# Patient Record
Sex: Female | Born: 1995
Health system: Southern US, Community
[De-identification: ages and names within clinical notes are randomized; demographics above are authoritative.]

## PROBLEM LIST (undated history)

## (undated) DIAGNOSIS — R51 Headache: Secondary | ICD-10-CM

## (undated) DIAGNOSIS — J302 Other seasonal allergic rhinitis: Secondary | ICD-10-CM

## (undated) DIAGNOSIS — K219 Gastro-esophageal reflux disease without esophagitis: Secondary | ICD-10-CM

## (undated) DIAGNOSIS — T7840XA Allergy, unspecified, initial encounter: Secondary | ICD-10-CM

## (undated) DIAGNOSIS — J45909 Unspecified asthma, uncomplicated: Secondary | ICD-10-CM

## (undated) DIAGNOSIS — F419 Anxiety disorder, unspecified: Secondary | ICD-10-CM

## (undated) DIAGNOSIS — L719 Rosacea, unspecified: Secondary | ICD-10-CM

## (undated) DIAGNOSIS — M329 Systemic lupus erythematosus, unspecified: Secondary | ICD-10-CM

## (undated) DIAGNOSIS — F32A Depression, unspecified: Secondary | ICD-10-CM

## (undated) DIAGNOSIS — IMO0002 Reserved for concepts with insufficient information to code with codable children: Secondary | ICD-10-CM

## (undated) HISTORY — DX: Systemic lupus erythematosus, unspecified: M32.9

## (undated) HISTORY — DX: Anxiety disorder, unspecified: F41.9

## (undated) HISTORY — PX: WISDOM TOOTH EXTRACTION: SHX21

## (undated) HISTORY — DX: Gastro-esophageal reflux disease without esophagitis: K21.9

## (undated) HISTORY — DX: Rosacea, unspecified: L71.9

## (undated) HISTORY — PX: NO PAST SURGERIES: SHX2092

## (undated) HISTORY — DX: Reserved for concepts with insufficient information to code with codable children: IMO0002

## (undated) HISTORY — DX: Other seasonal allergic rhinitis: J30.2

## (undated) HISTORY — DX: Allergy, unspecified, initial encounter: T78.40XA

## (undated) HISTORY — DX: Unspecified asthma, uncomplicated: J45.909

## (undated) HISTORY — DX: Depression, unspecified: F32.A

## (undated) HISTORY — DX: Headache: R51

---
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
## 2014-01-24 ENCOUNTER — Ambulatory Visit (INDEPENDENT_AMBULATORY_CARE_PROVIDER_SITE_OTHER): Payer: 59 | Admitting: Psychiatry

## 2014-01-24 ENCOUNTER — Encounter (INDEPENDENT_AMBULATORY_CARE_PROVIDER_SITE_OTHER): Payer: Self-pay

## 2014-01-24 VITALS — BP 110/73 | HR 68 | Ht 61.0 in | Wt 157.0 lb

## 2014-01-24 DIAGNOSIS — F411 Generalized anxiety disorder: Secondary | ICD-10-CM

## 2014-01-24 MED ORDER — SERTRALINE HCL 100 MG PO TABS: 100.0000 mg | ORAL_TABLET | Freq: Every day | ORAL | Status: DC

## 2014-01-24 MED ORDER — PROPRANOLOL HCL 10 MG PO TABS: 15.0000 mg | ORAL_TABLET | Freq: Every day | ORAL | Status: DC

## 2014-01-24 NOTE — Progress Notes (Signed)
Patient ID: Jamie Lopez, female   DOB: May 06, 1996, 18 y.o.   MRN: [id number]  [facility] [address] Progress Note  Jamie Lopez [id number] 18 y.o.  01/24/2014 4:27 PM  Chief Complaint:  HPI Comments: Jamie Lopez is  a 18 y/o female with a past psychiatric history significant for Generalized Anxiety Disorder. The patient is referred to psychiatric services for  medication management.    . Location- The patient reports she has been doing okay.  . Quality: The patient reports that she will be going to college at [employer]. She has had panic attacks 2 years ago. Since she's been on these medications she's not having panic attacks she got a cardiology workup from a cardiologist and did not have any cardiac issues. She has noticed some fatigue or tiredness. Otherwise is no reported side effects In the area of affective symptoms, patient appears euthymic. Patient denies current suicidal ideation, intent, or plan. Patient denies current homicidal ideation, intent, or plan. Patient denies auditory hallucinations. Patient denies visual hallucinations. Patient denies symptoms of paranoia. Patient states sleep is good, with approximately 6-8hours of sleep per night. Appetite is good. Energy level is good. Patient denies symptoms of anhedonia. Patient denies hopelessness, helplessness, or guilt.    . Severity: Depression: 8/10 (0=Very depressed; 5=Neutral; 10=Very Happy)  Anxiety- 5/10 (0=no anxiety; 5= moderate/tolerable anxiety; 10= panic attacks)  . Duration-Since the age of 19.  . Timing-Usually when driving, particularly at night.  . Context-Obsessing about school work.   . Modifying factors-medications have improved her mood.    History of Present Illness: Suicidal Ideation: Negative Plan Formed: Negative Patient has means to carry out plan: Negative  Homicidal Ideation: Negative Plan Formed: Negative Patient has means to carry out plan: Negative   Neurologic: Headache:  Negative Seizure: Negative Paresthesias: Negative  Past Medical Family, Social History: Past Medical History  Diagnosis Date  . Asthma   . Headache(784.0)     Family History  Problem Relation Age of Onset  . Anxiety disorder Mother   . Diabetes Mellitus II Mother   . Anxiety disorder Maternal Grandfather   . Depression Maternal Grandfather   . Alcohol abuse Maternal Grandfather   . Drug abuse Maternal Grandfather   . Anxiety disorder Maternal Grandmother   . Diabetes Mellitus II Maternal Grandmother   . Alcohol abuse Paternal Grandfather   . Drug abuse Paternal Grandfather   . Bipolar disorder Cousin   . Schizophrenia Cousin     History   Social History  . Marital Status: Single    Spouse Name: N/A    Number of Children: N/A  . Years of Education: N/A   Social History Main Topics  . Smoking status: Current Some Day Smoker  . Smokeless tobacco: Never Used     Comment: Once month.  . Alcohol Use: No  . Drug Use: No  . Sexual Activity: Yes    Partners: Male    Birth Control/ Protection: Condom   Other Topics Concern  . Not on file   Social History Narrative  . No narrative on file     Outpatient Encounter Prescriptions as of 01/24/2014  Medication Sig  . PROAIR HFA 108 (90 BASE) MCG/ACT inhaler   . propranolol (INDERAL) 10 MG tablet Take 1.5 tablets (15 mg total) by mouth at bedtime.  . sertraline (ZOLOFT) 100 MG tablet Take 1 tablet (100 mg total) by mouth daily.  . [DISCONTINUED] propranolol (INDERAL) 10 MG tablet Take 2 tablets (20 mg total) by mouth at  bedtime.  . [DISCONTINUED] sertraline (ZOLOFT) 100 MG tablet Take 1 tablet (100 mg total) by mouth daily.    Physical Exam: Review of Systems  Constitutional: Negative for fever, chills, weight loss and malaise/fatigue.  Cardiovascular: Negative for chest pain and palpitations.  Gastrointestinal: Negative for nausea, vomiting and abdominal pain.  Neurological: Negative for dizziness, tingling, tremors,  loss of consciousness and headaches.  Psychiatric/Behavioral: Negative for depression, suicidal ideas, hallucinations, memory loss and substance abuse. The patient is not nervous/anxious and does not have insomnia.     Constitutional:  BP 110/73  Pulse 68  Ht  (1.549 m)  Wt 157 lb (71.215 kg)  BMI 29.68 kg/m2  General Appearance: alert, oriented, no acute distress and well nourished  Musculoskeletal: Gait & Station: normal Patient leans: N/A  Psychiatric Specialty Exam: General Appearance: Casual and Well Groomed  Patent attorney::  Fair  Speech:  Clear and Coherent and Normal Rate  Volume:  Normal  Mood:  "happy"  Affect:  Appropriate, Congruent, Labile and Full Range  Thought Process:  Coherent, Goal Directed, Linear and Logical  Orientation:  Full (Time, Place, and Person)  Thought Content:  WDL  Suicidal Thoughts:  No  Homicidal Thoughts:  No  Memory:  Immediate;   Good Recent;   Good Remote;   Good  Judgement:  Good  Insight:  Good  Psychomotor Activity:  Normal  Concentration:  Good  Recall:  Good  Akathisia:  No  Handed:  Ambidextrous  AIMS (if indicated): Not indicated  Assets:  Communication Skills Desire for Improvement Financial Resources/Insurance Housing Intimacy Leisure Time Physical Health Resilience Social Support Energy manager   Language intact and comprehensive    Fund of knowledge is good     Assessment: Axis I: Generalized Anxiety Disorder-stable. Possible Panic disorder. Stable.    Plan:   Plan of Care:  PLAN:  1. Affirm with the patient that the medications are taken as ordered. Patient  expressed understanding of how their medications were to be used.    Laboratory:  NO labs warranted at this time.  Psychotherapy: Therapy: brief supportive therapy provided.  Discussed psychosocial stressors in detail.More than 50% of the visit was spent on individual therapy/counseling.   Medications:   Continue the following psychiatric medications as written prior to this appointment with the following changes::  a) sertraline 100 mg daily b) Considering her tiredness and propensity of depression related to b blockers, decrease propanolol to 15 mg at night for the next 2 weeks. After 2 weeks she is currently taking 10 mg total she follows up in 4 weeks -Risks and benefits, side effects and alternatives discussed with patient, he/she was given an opportunity to ask questions about his/her medication, illness, and treatment. All current psychiatric medications have been reviewed and discussed with the patient and adjusted as clinically appropriate. The patient has been provided an accurate and updated list of the medications being now prescribed.   Routine PRN Medications:  Negative  Consultations: The patient was encouraged to keep all PCP and specialty clinic appointments.   Safety Concerns:   Patient told to call clinic if any problems occur. Patient advised to go to  ER  if s/he should develop SI/HI, side effects, or if symptoms worsen. Has crisis numbers to call if needed.    Other:   8. Patient was instructed to return to clinic in 1 months.  9. The patient was advised to call and cancel their mental health appointment within 24 hours of  the appointment, if they are unable to keep the appointment, as well as the three no show and termination from clinic policy. 10. The patient expressed understanding of the plan and agrees with the above. 11 Advised patient I would no longer be at this practice after September 11, 2013.  Time Spent: 25 minutes  Thresa Ross, MD 01/24/2014

## 2014-01-24 NOTE — Patient Instructions (Signed)
Lower inderal to  at night for 2 weeks then  at night.

## 2014-02-18 ENCOUNTER — Ambulatory Visit (HOSPITAL_COMMUNITY): Payer: 59 | Admitting: Psychiatry

## 2014-02-27 ENCOUNTER — Ambulatory Visit (HOSPITAL_COMMUNITY): Payer: 59 | Admitting: Psychiatry

## 2015-05-12 ENCOUNTER — Encounter (HOSPITAL_COMMUNITY): Payer: Self-pay | Admitting: Psychiatry

## 2015-05-12 ENCOUNTER — Ambulatory Visit (INDEPENDENT_AMBULATORY_CARE_PROVIDER_SITE_OTHER): Payer: BLUE CROSS/BLUE SHIELD | Admitting: Psychiatry

## 2015-05-12 ENCOUNTER — Encounter (INDEPENDENT_AMBULATORY_CARE_PROVIDER_SITE_OTHER): Payer: Self-pay

## 2015-05-12 VITALS — BP 127/86 | HR 86 | Ht 62.0 in | Wt 164.0 lb

## 2015-05-12 DIAGNOSIS — F411 Generalized anxiety disorder: Secondary | ICD-10-CM | POA: Diagnosis not present

## 2015-05-12 DIAGNOSIS — Z79899 Other long term (current) drug therapy: Secondary | ICD-10-CM

## 2015-05-12 MED ORDER — SERTRALINE HCL 50 MG PO TABS: 50.0000 mg | ORAL_TABLET | Freq: Every day | ORAL | Status: DC

## 2015-05-12 MED ORDER — PROPRANOLOL HCL 10 MG PO TABS: 10.0000 mg | ORAL_TABLET | Freq: Two times a day (BID) | ORAL | Status: DC | PRN

## 2015-05-12 NOTE — Progress Notes (Signed)
Psychiatric Initial Adult Assessment   Patient Identification: Jamie Lopez MRN:  [id number] Date of Evaluation:  05/12/2015 Referral Source: self Chief Complaint:   Chief Complaint    Anxiety     Visit Diagnosis:    ICD-9-CM ICD-10-CM   1. GAD (generalized anxiety disorder) 300.02 F41.1 sertraline (ZOLOFT) 50 MG tablet     propranolol (INDERAL) 10 MG tablet  2. Encounter for long-term (current) use of medications V58.69 Z79.899 CBC     TSH     Comprehensive metabolic panel     Drug Screen, Urine     Vitamin B12   Diagnosis:   Patient Active Problem List   Diagnosis Date Noted  . GAD (generalized anxiety disorder) [F41.1] 10/25/2012  . Migraines [G43.909] 10/25/2012   History of Present Illness:  Pt has been feeling very detached and irritable. Pt has low stress tolerance and is angry all the time. Pt feels stressed all the time. It has been going ton for 2-3 months. Pt is losing interest in school and is thinking of dropping out. Once or twice a month she gets very depressed and wants to cry and stay in bed all day.   Overall denies depression. Reports anhedonia. Denies isolation, crying spells, worthlessness and hopelessness. Denies SI/HI.   Pt is sleeping 5-7 hrs/night. She has racing thoughts that she can't shut off. Pt is tired. Appetite is  Ok but she can't always eat because she is busy. Focus is off.   Elements:  Severity:  moderate. Timing:  1 yr. Duration:  2+ years. Context:  quality of life. Associated Signs/Symptoms: Depression Symptoms:  anhedonia, insomnia, fatigue, difficulty concentrating, anxiety, (Hypo) Manic Symptoms:  Irritable Mood,  Denies manic and hypomanic symptoms including periods of decreased need for sleep, increased energy, mood lability, impulsivity, FOI, and excessive spending.  Anxiety Symptoms:  Excessive Worry, Panic Symptoms, Specific Phobias,  Pt has stress induced panic attacks 1-2 month. It was better with meds.  Pt has  anxiety- racing thoughts, insomnia, inability to relax, headaches and fatigue. It was better with Zoloft and Propanolol.   Pt reports has a fear of death and will make rash decisions to avoid to satisfy her fear.  Denies OCD and social anxiety.   Psychotic Symptoms:  negative PTSD Symptoms: Negative  Past Medical History:  Past Medical History  Diagnosis Date  . Asthma   . Headache(784.0)   . Anxiety   . Seasonal allergies     Past Surgical History  Procedure Laterality Date  . No past surgeries     Past Psych Hx: Dx: GAD, panic attacks began at the age of 70 Meds: Amitriptyline- SE, Zoloft, Propanolol Previous psychiatrist/therapist: Dr. Gilmore Laroche, previous therapist Faylene Kurtz. Hospitalizations: denies SIB: denies Suicide attempts: denies Hx of violent behavior towards others: denies Current access to guns: locked guns in her house Hx of abuse: denies Military Hx: denies   Family History:  Family History  Problem Relation Age of Onset  . Anxiety disorder Mother   . Diabetes Mellitus II Mother   . Depression Mother   . Anxiety disorder Maternal Grandfather   . Depression Maternal Grandfather   . Alcohol abuse Maternal Grandfather   . Drug abuse Maternal Grandfather   . Anxiety disorder Maternal Grandmother   . Diabetes Mellitus II Maternal Grandmother   . Depression Maternal Grandmother   . Alcohol abuse Paternal Grandfather   . Drug abuse Paternal Grandfather   . Bipolar disorder Cousin   . Schizophrenia Cousin   . Drug  abuse Father    Social History:   Social History   Social History  . Marital Status: Single    Spouse Name: N/A  . Number of Children: 0  . Years of Education: 14   Occupational History  . student    Social History Main Topics  . Smoking status: Former Smoker    Quit date: 05/11/2013  . Smokeless tobacco: Never Used     Comment: Once month.  . Alcohol Use: Yes     Comment: 1 drink a few times a year  . Drug Use: No  . Sexual  Activity:    Partners: Male    Birth Control/ Protection: Condom   Other Topics Concern  . None   Social History Narrative   Pt lives in [place] with mom and brother. Pt goes to [employer] and pt is a sophomore. Pt was born in [place] and raised by mom. Pt has 4 siblings and 1 on the way on her dad's side. Childhood was nice and she mostly raised as an child.  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      Additional Social History: n/a  Musculoskeletal: Strength & Muscle Tone: within normal limits Gait & Station: normal  Patient leans: N/A  Psychiatric Specialty Exam: HPI  Review of Systems  Constitutional: Negative for fever and chills.  HENT: Negative for congestion, ear pain and sore throat.   Eyes: Positive for blurred vision. Negative for double vision and discharge.  Respiratory: Negative for cough, shortness of breath and wheezing.   Cardiovascular: Positive for palpitations. Negative for chest pain and leg swelling.  Gastrointestinal: Positive for heartburn. Negative for nausea, vomiting and abdominal pain.  Musculoskeletal: Positive for joint pain. Negative for back pain and neck pain.       R hand itching and swollen. States it is due to a bug bite.   Skin: Positive for itching. Negative for rash.  Neurological: Positive for headaches. Negative for dizziness, tremors, seizures, loss of consciousness and weakness.  Psychiatric/Behavioral: Positive for depression. Negative for suicidal ideas, hallucinations and substance abuse. The patient is nervous/anxious and has insomnia.     Blood pressure 127/86, pulse 86, height  (1.575 m), weight 164 lb (74.39 kg).Body mass index is 29.99 kg/(m^2).  General Appearance: Fairly Groomed  Eye Contact:  Good  Speech:  Clear and Coherent and Normal Rate  Volume:  Normal  Mood:  Anxious and Irritable  Affect:  Congruent  Thought Process:  Goal Directed  Orientation:  Full (Time, Place, and Person)  Thought  Content:  Negative  Suicidal Thoughts:  No  Homicidal Thoughts:  No  Memory:  Immediate;   Good Recent;   Good Remote;   Good  Judgement:  Good  Insight:  Good  Psychomotor Activity:  Normal  Concentration:  Good  Recall:  Good  Fund of Knowledge:Good  Language: Good  Akathisia:  No  Handed:  Right  AIMS (if indicated):  n/a  Assets:  Communication Skills Desire for Improvement Financial Resources/Insurance Housing Leisure Time Physical Health Resilience Social Support Talents/Skills Transportation Vocational/Educational  ADL's:  Intact  Cognition: WNL  Sleep:  fair   Is the patient at risk to self?  No. Has the patient been a risk to self in the past 6 months?  No. Has the patient been a risk to self within the distant past?  No. Is the patient a risk to others?  No. Has the patient been a risk  to others in the past 6 months?  No. Has the patient been a risk to others within the distant past?  No.  Allergies:   Allergies  Allergen Reactions  . Fruit & Vegetable Daily [Nutritional Supplements]   . Peanut-Containing Drug Products     Tree nut specifically    Current Medications: Current Outpatient Prescriptions  Medication Sig Dispense Refill  . levonorgestrel-ethinyl estradiol (SEASONALE,INTROVALE,JOLESSA) 0.15-0.03 MG tablet Take 1 tablet by mouth daily.    Marland Kitchen. PROAIR HFA 108 (90 BASE) MCG/ACT inhaler     . propranolol (INDERAL) 10 MG tablet Take 1.5 tablets (15 mg total) by mouth at bedtime. (Patient not taking: Reported on 05/12/2015) 45 tablet 1  . sertraline (ZOLOFT) 100 MG tablet Take 1 tablet (100 mg total) by mouth daily. (Patient not taking: Reported on 05/12/2015) 30 tablet 1   No current facility-administered medications for this visit.    Previous Psychotropic Medications: Yes   Substance Abuse History in the last 12 months:  No.  Consequences of Substance Abuse: Negative  Medical Decision Making:  Review of Psycho-Social Stressors (1), Review  or order clinical lab tests (1), Review and summation of old records (2), Established Problem, Worsening (2), Review of Medication Regimen & Side Effects (2) and Review of New Medication or Change in Dosage (2)  Treatment Plan Summary: Medication management and Plan see plan  Assessment: GAD   Medication management with supportive therapy. Risks/benefits and SE of the medication discussed. Pt verbalized understanding and verbal consent obtained for treatment.  Affirm with the patient that the medications are taken as ordered. Patient expressed understanding of how their medications were to be used.  Meds: restart Zoloft 50mg  po qD for anxiety and mood Restart Propanolol 10mg  po BIDn anxiety   Labs: CBC, CMP, Vit B12, TSH, UDS  Therapy: brief supportive therapy provided. Discussed psychosocial stressors in detail.   Encouraged pt to develop daily routine and work on daily goal setting as a way to improve mood symptoms.    Consultations:  Declined therapy The patient was encouraged to keep all PCP and specialty clinic appointments.   Pt denies SI and is at an acute low risk for suicide. Patient told to call clinic if any problems occur. Patient advised to go to ER if they should develop SI/HI, side effects, or if symptoms worsen. Has crisis numbers to call if needed. Pt verbalized understanding.  F/up in 2 months or sooner if needed   Avo Schlachter 12/13/201611:35 AM

## 2015-06-20 LAB — TSH: TSH: 0.722 u[IU]/mL (ref 0.350–4.500)

## 2015-06-20 LAB — CBC
HCT: 37.9 % (ref 36.0–46.0)
Hemoglobin: 12.7 g/dL (ref 12.0–15.0)
MCH: 30 pg (ref 26.0–34.0)
MCHC: 33.5 g/dL (ref 30.0–36.0)
MCV: 89.4 fL (ref 78.0–100.0)
MPV: 9.4 fL (ref 8.6–12.4)
Platelets: 408 10*3/uL — ABNORMAL HIGH (ref 150–400)
RBC: 4.24 MIL/uL (ref 3.87–5.11)
RDW: 13.5 % (ref 11.5–15.5)
WBC: 5.7 10*3/uL (ref 4.0–10.5)

## 2015-06-20 LAB — VITAMIN B12: Vitamin B-12: 487 pg/mL (ref 211–911)

## 2015-06-20 LAB — DRUG SCREEN, URINE
Amphetamine Screen, Ur: NEGATIVE
Barbiturate Quant, Ur: NEGATIVE
Benzodiazepines.: NEGATIVE
Cocaine Metabolites: NEGATIVE
Creatinine,U: 417.65 mg/dL
Marijuana Metabolite: NEGATIVE
Methadone: NEGATIVE
Opiates: NEGATIVE
Phencyclidine (PCP): NEGATIVE
Propoxyphene: NEGATIVE

## 2015-06-20 LAB — COMPREHENSIVE METABOLIC PANEL
ALT: 37 U/L — ABNORMAL HIGH (ref 6–29)
AST: 29 U/L (ref 10–30)
Albumin: 3.7 g/dL (ref 3.6–5.1)
Alkaline Phosphatase: 57 U/L (ref 33–115)
BUN: 7 mg/dL (ref 7–25)
CO2: 24 mmol/L (ref 20–31)
Calcium: 8.9 mg/dL (ref 8.6–10.2)
Chloride: 103 mmol/L (ref 98–110)
Creat: 0.67 mg/dL (ref 0.50–1.10)
Glucose, Bld: 76 mg/dL (ref 65–99)
Potassium: 4.3 mmol/L (ref 3.5–5.3)
Sodium: 134 mmol/L — ABNORMAL LOW (ref 135–146)
Total Bilirubin: 0.6 mg/dL (ref 0.2–1.2)
Total Protein: 7.2 g/dL (ref 6.1–8.1)

## 2015-07-14 ENCOUNTER — Encounter (HOSPITAL_COMMUNITY): Payer: Self-pay | Admitting: Psychiatry

## 2015-07-14 ENCOUNTER — Ambulatory Visit (INDEPENDENT_AMBULATORY_CARE_PROVIDER_SITE_OTHER): Payer: Federal, State, Local not specified - PPO | Admitting: Psychiatry

## 2015-07-14 VITALS — BP 102/68 | HR 67 | Ht 62.0 in | Wt 163.4 lb

## 2015-07-14 DIAGNOSIS — F411 Generalized anxiety disorder: Secondary | ICD-10-CM

## 2015-07-14 MED ORDER — PROPRANOLOL HCL 10 MG PO TABS: 10.0000 mg | ORAL_TABLET | Freq: Two times a day (BID) | ORAL | Status: DC | PRN

## 2015-07-14 MED ORDER — SERTRALINE HCL 100 MG PO TABS: 100.0000 mg | ORAL_TABLET | Freq: Every day | ORAL | Status: DC

## 2015-07-14 NOTE — Progress Notes (Signed)
BH MD/PA/NP OP Progress Note  07/14/2015 11:42 AM Katora Fini  MRN:  [id number]  Subjective:  States mood is better and she is handling stress better this semester. Pt has had a few infrequent crying spells. Reports on 3 different occasions she felt "blank" for a day but that went away on it's own.  Pt denies depression. Denies anhedonia, isolation,  low motivation, poor hygiene, worthlessness and hopelessness. Denies SI/HI.  Sleep is good but pt is not getting enough because she is busy.  Appetite has improved.  Energy is fair and pt is working out 3x/week.  Concentration is good.   Pt has had a few panic attacks in large crowds where she doesn't know most people. Pt finds she is avoiding going out. Pt takes Propanolol 2 tabs at bedtime and then falls asleep.   Anxiety is going down. She has racing thoughts, HA and fatigue that are better as compared to a few months ago. She worries about 1-2 hrs/day. Pt states it happens more when she is alone and not busy.   Taking meds as prescribed and denies SE.    Chief Complaint: "better" Chief Complaint    Follow-up     Visit Diagnosis:     ICD-9-CM ICD-10-CM   1. GAD (generalized anxiety disorder) 300.02 F41.1 sertraline (ZOLOFT) 100 MG tablet     propranolol (INDERAL) 10 MG tablet    Past Medical History:  Past Medical History  Diagnosis Date  . Asthma   . Headache(784.0)   . Anxiety   . Seasonal allergies     Past Surgical History  Procedure Laterality Date  . No past surgeries     Past Psych Hx: Dx: GAD, panic attacks began at the age of 69 Meds: Amitriptyline- SE, Zoloft, Propanolol Previous psychiatrist/therapist: Dr. Gilmore Laroche, previous therapist Faylene Kurtz. Hospitalizations: denies SIB: denies Suicide attempts: denies Hx of violent behavior towards others: denies Current access to guns: locked guns in her house Hx of abuse: denies Military Hx: denies   Family History:  Family History  Problem Relation Age of  Onset  . Anxiety disorder Mother   . Diabetes Mellitus II Mother   . Depression Mother   . Anxiety disorder Maternal Grandfather   . Depression Maternal Grandfather   . Alcohol abuse Maternal Grandfather   . Drug abuse Maternal Grandfather   . Anxiety disorder Maternal Grandmother   . Diabetes Mellitus II Maternal Grandmother   . Depression Maternal Grandmother   . Alcohol abuse Paternal Grandfather   . Drug abuse Paternal Grandfather   . Bipolar disorder Cousin   . Schizophrenia Cousin   . Drug abuse Father    Social History:  Social History   Social History  . Marital Status: Single    Spouse Name: N/A  . Number of Children: 0  . Years of Education: 14   Occupational History  . student    Social History Main Topics  . Smoking status: Former Smoker    Quit date: 05/11/2013  . Smokeless tobacco: Never Used     Comment: Once month.  . Alcohol Use: Yes     Comment: 1 drink a few times a year  . Drug Use: Yes    Special: Marijuana     Comment: last use was on her birthday in Dec 2017  . Sexual Activity:    Partners: Male    Birth Control/ Protection: Condom   Other Topics Concern  . None   Social History Narrative   Pt lives  in [place] with mom and brother. Pt goes to [employer] and pt is a sophomore. Pt was born in [place] and raised by mom. Pt has 4 siblings and 1 on the way on her dad's side. Childhood was nice and she mostly raised as an child.  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        Musculoskeletal: Strength & Muscle Tone: within normal limits Gait & Station: normal Patient leans: straight  Psychiatric Specialty Exam: HPI  Review of Systems  Constitutional: Negative for fever, chills and malaise/fatigue.  HENT: Negative for congestion and sore throat.   Eyes: Negative for blurred vision, double vision and redness.  Respiratory: Negative for cough, shortness of breath and wheezing.   Cardiovascular: Negative for chest pain,  palpitations and leg swelling.  Gastrointestinal: Negative for heartburn, nausea, vomiting and abdominal pain.  Musculoskeletal: Negative for back pain, joint pain and neck pain.  Skin: Negative for itching and rash.  Neurological: Positive for headaches. Negative for dizziness, tremors, seizures and loss of consciousness.  Psychiatric/Behavioral: Negative for depression, suicidal ideas, hallucinations and substance abuse. The patient is nervous/anxious. The patient does not have insomnia.     Blood pressure 102/68, pulse 67, height 5\' 2"  (1.575 m), weight 163 lb 6.4 oz (74.118 kg), last menstrual period 04/26/2015.Body mass index is 29.88 kg/(m^2).  General Appearance: Fairly Groomed  Eye Contact:  Good  Speech:  Clear and Coherent and Normal Rate  Volume:  Normal  Mood:  Anxious  Affect:  Congruent  Thought Process:  Goal Directed  Orientation:  Full (Time, Place, and Person)  Thought Content:  Negative  Suicidal Thoughts:  No  Homicidal Thoughts:  No  Memory:  Immediate;   Fair Recent;   Fair Remote;   Fair  Judgement:  Intact  Insight:  Good  Psychomotor Activity:  Normal  Concentration:  Good  Recall:  Good  Fund of Knowledge: Good  Language: Good  Akathisia:  No  Handed:  Right  AIMS (if indicated):  n/a  Assets:  Communication Skills Desire for Improvement Financial Resources/Insurance Housing Leisure Time Physical Health Resilience Social Support Talents/Skills Transportation Vocational/Educational  ADL's:  Intact  Cognition: WNL  Sleep:  good   Is the patient at risk to self?  No. Has the patient been a risk to self in the past 6 months?  No. Has the patient been a risk to self within the distant past?  No. Is the patient a risk to others?  No. Has the patient been a risk to others in the past 6 months?  No. Has the patient been a risk to others within the distant past?  No.  Current Medications: Current Outpatient Prescriptions  Medication Sig  Dispense Refill  . cetirizine (ZYRTEC) 10 MG tablet Take 10 mg by mouth daily.    Marland Kitchen levonorgestrel-ethinyl estradiol (SEASONALE,INTROVALE,JOLESSA) 0.15-0.03 MG tablet Take 1 tablet by mouth daily.    Marland Kitchen PROAIR HFA 108 (90 BASE) MCG/ACT inhaler     . propranolol (INDERAL) 10 MG tablet Take 1 tablet (10 mg total) by mouth 2 (two) times daily as needed. 60 tablet 1  . sertraline (ZOLOFT) 50 MG tablet Take 1 tablet (50 mg total) by mouth daily. 30 tablet 1   No current facility-administered medications for this visit.    Medical Decision Making:  Established Problem, Stable/Improving (1), Review of Psycho-Social Stressors (1), Review or order clinical lab tests (1), Review of Medication Regimen & Side Effects (2) and  Review of New Medication or Change in Dosage (2)  Treatment Plan Summary:Medication management and Plan see below   Assessment: GAD  Medication management with supportive therapy. Risks/benefits and SE of the medication discussed. Pt verbalized understanding and verbal consent obtained for treatment. Affirm with the patient that the medications are taken as ordered. Patient expressed understanding of how their medications were to be used.  Meds: increase Zoloft to  po qD for anxiety and mood Propanolol  po BID anxiety   Labs: CBC WNL, CMP Na 134, Vit B12 WNL, TSH WNL, UDS neg  Therapy: brief supportive therapy provided. Discussed psychosocial stressors in detail.  Encouraged pt to develop daily routine and work on daily goal setting as a way to improve mood symptoms.    Consultations: Declined therapy The patient was encouraged to keep all PCP and specialty clinic appointments.   Pt denies SI and is at an acute low risk for suicide. Patient told to call clinic if any problems occur. Patient advised to go to ER if they should develop SI/HI, side effects, or if symptoms worsen. Has crisis numbers to call if needed. Pt verbalized understanding.  F/up in 2 months  or sooner if needed    Ellie Spickler 07/14/2015, 11:42 AM

## 2015-09-15 ENCOUNTER — Encounter (HOSPITAL_COMMUNITY): Payer: Self-pay | Admitting: Psychiatry

## 2015-09-15 ENCOUNTER — Ambulatory Visit (INDEPENDENT_AMBULATORY_CARE_PROVIDER_SITE_OTHER): Payer: Federal, State, Local not specified - PPO | Admitting: Psychiatry

## 2015-09-15 VITALS — BP 120/78 | HR 78 | Ht 62.0 in | Wt 164.2 lb

## 2015-09-15 DIAGNOSIS — G4700 Insomnia, unspecified: Secondary | ICD-10-CM

## 2015-09-15 DIAGNOSIS — F4322 Adjustment disorder with anxiety: Secondary | ICD-10-CM | POA: Diagnosis not present

## 2015-09-15 DIAGNOSIS — F411 Generalized anxiety disorder: Secondary | ICD-10-CM

## 2015-09-15 MED ORDER — SERTRALINE HCL 100 MG PO TABS: 150.0000 mg | ORAL_TABLET | Freq: Every day | ORAL | Status: DC

## 2015-09-15 MED ORDER — HYDROXYZINE PAMOATE 25 MG PO CAPS: 25.0000 mg | ORAL_CAPSULE | Freq: Two times a day (BID) | ORAL | Status: DC | PRN

## 2015-09-15 MED ORDER — PROPRANOLOL HCL 10 MG PO TABS: 10.0000 mg | ORAL_TABLET | Freq: Two times a day (BID) | ORAL | Status: DC | PRN

## 2015-09-15 NOTE — Progress Notes (Signed)
Patient ID: Jamie Lopez, female   DOB: 08-30-1995, 20 y.o.   MRN: [id number] [facility] MD/PA/NP OP Progress Note  09/15/2015 11:40 AM Jamie Lopez  MRN:  [id number]  Subjective: Fighting with mom about moving out, having an STD  States mood is better about school and she is handling stress better this semester. Reports on 3 different occasions she felt "blank" for a day but that went away on it's own.  Pt denies depression. Denies anhedonia, isolation,  low motivation, poor hygiene, worthlessness and hopelessness. Denies SI/HI. Pt reports a hx of crying for hours infrequently.  Sleep is poor due to racing thoughts.  Appetite has improved but her schedule affects it.   Energy is fair and pt is working out 3x/week.  Concentration is good.   Pt has had a rare panic attacks in large crowds where she doesn't know most people. Pt finds she is avoiding going out. Pt takes Propanolol 2 tabs at bedtime and then falls asleep.   Anxiety is going up. She has racing thoughts at night. Denies HA and fatigue. She worries about 1-2 hrs/day. Pt states it happens more when she is alone and not busy.   Taking meds as prescribed and denies SE.    Chief Complaint: "things are hard" Chief Complaint    Follow-up     Visit Diagnosis:     ICD-9-CM ICD-10-CM   1. GAD (generalized anxiety disorder) 300.02 F41.1 sertraline (ZOLOFT) 100 MG tablet     propranolol (INDERAL) 10 MG tablet  2. Adjustment disorder with anxious mood 309.24 F43.22 sertraline (ZOLOFT) 100 MG tablet     propranolol (INDERAL) 10 MG tablet  3. Insomnia 780.52 G47.00 hydrOXYzine (VISTARIL) 25 MG capsule    Past Medical History:  Past Medical History  Diagnosis Date  . Asthma   . Headache(784.0)   . Anxiety   . Seasonal allergies     Past Surgical History  Procedure Laterality Date  . No past surgeries     Past Psych Hx: Dx: GAD, panic attacks began at the age of 50 Meds: Amitriptyline- SE, Zoloft, Propanolol Previous  psychiatrist/therapist: Dr. Gilmore Laroche, previous therapist Faylene Kurtz. Hospitalizations: denies SIB: denies Suicide attempts: denies Hx of violent behavior towards others: denies Current access to guns: locked guns in her house Hx of abuse: denies Military Hx: denies   Family History:  Family History  Problem Relation Age of Onset  . Anxiety disorder Mother   . Diabetes Mellitus II Mother   . Depression Mother   . Anxiety disorder Maternal Grandfather   . Depression Maternal Grandfather   . Alcohol abuse Maternal Grandfather   . Drug abuse Maternal Grandfather   . Anxiety disorder Maternal Grandmother   . Diabetes Mellitus II Maternal Grandmother   . Depression Maternal Grandmother   . Alcohol abuse Paternal Grandfather   . Drug abuse Paternal Grandfather   . Bipolar disorder Cousin   . Schizophrenia Cousin   . Drug abuse Father    Social History:  Social History   Social History  . Marital Status: Single    Spouse Name: N/A  . Number of Children: 0  . Years of Education: 14   Occupational History  . student    Social History Main Topics  . Smoking status: Former Smoker    Quit date: 05/11/2013  . Smokeless tobacco: Never Used     Comment: Once month.  . Alcohol Use: Yes     Comment: a few drinks a few times a year  .  Drug Use: Yes    Special: Marijuana     Comment: randomly  . Sexual Activity:    Partners: Male    Birth Control/ Protection: Condom   Other Topics Concern  . None   Social History Narrative   Pt lives in [place] with mom and brother. Pt goes to [employer] and pt is a sophomore. Pt was born in [place] and raised by mom. Pt has 4 siblings and 1 on the way on her dad's side. Childhood was nice and she mostly raised as an child.  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        Musculoskeletal: Strength & Muscle Tone: within normal limits Gait & Station: normal Patient leans: straight  Psychiatric Specialty Exam: HPI  Review  of Systems  Constitutional: Negative for fever, chills and malaise/fatigue.  HENT: Negative for congestion and sore throat.   Eyes: Negative for blurred vision, double vision and redness.  Respiratory: Negative for cough, shortness of breath and wheezing.   Cardiovascular: Negative for chest pain, palpitations and leg swelling.  Gastrointestinal: Negative for heartburn, nausea, vomiting and abdominal pain.  Musculoskeletal: Negative for back pain, joint pain and neck pain.  Skin: Negative for itching and rash.  Neurological: Negative for dizziness, tremors, seizures, loss of consciousness and headaches.  Endo/Heme/Allergies: Positive for environmental allergies. Negative for polydipsia. Does not bruise/bleed easily.  Psychiatric/Behavioral: Negative for depression, suicidal ideas, hallucinations and substance abuse. The patient is nervous/anxious. The patient does not have insomnia.     Blood pressure 120/78, pulse 78, height  (1.575 m), weight 164 lb 3.2 oz (74.481 kg).Body mass index is 30.03 kg/(m^2).  General Appearance: Fairly Groomed  Eye Contact:  Good  Speech:  Clear and Coherent and Normal Rate  Volume:  Normal  Mood:  Anxious  Affect:  Congruent  Thought Process:  Goal Directed  Orientation:  Full (Time, Place, and Person)  Thought Content:  Negative  Suicidal Thoughts:  No  Homicidal Thoughts:  No  Memory:  Immediate;   Fair Recent;   Fair Remote;   Fair  Judgement:  Intact  Insight:  Good  Psychomotor Activity:  Normal  Concentration:  Good  Recall:  Good  Fund of Knowledge: Good  Language: Good  Akathisia:  No  Handed:  Right  AIMS (if indicated):  n/a  Assets:  Communication Skills Desire for Improvement Financial Resources/Insurance Housing Leisure Time Physical Health Resilience Social Support Talents/Skills Transportation Vocational/Educational  ADL's:  Intact  Cognition: WNL  Sleep:  good   Is the patient at risk to self?  No. Has the  patient been a risk to self in the past 6 months?  No. Has the patient been a risk to self within the distant past?  No. Is the patient a risk to others?  No. Has the patient been a risk to others in the past 6 months?  No. Has the patient been a risk to others within the distant past?  No.  Current Medications: Current Outpatient Prescriptions  Medication Sig Dispense Refill  . cetirizine (ZYRTEC) 10 MG tablet Take 10 mg by mouth daily.    Marland Kitchen levonorgestrel-ethinyl estradiol (SEASONALE,INTROVALE,JOLESSA) 0.15-0.03 MG tablet Take 1 tablet by mouth daily.    Marland Kitchen PROAIR HFA 108 (90 BASE) MCG/ACT inhaler     . propranolol (INDERAL) 10 MG tablet Take 1 tablet (10 mg total) by mouth 2 (two) times daily as needed. 60 tablet 1  . sertraline (ZOLOFT) 100 MG  tablet Take 1 tablet (100 mg total) by mouth daily. 30 tablet 1   No current facility-administered medications for this visit.    Medical Decision Making:  Established Problem, Stable/Improving (1), Review of Psycho-Social Stressors (1), Established Problem, Worsening (2), Review of Medication Regimen & Side Effects (2) and Review of New Medication or Change in Dosage (2)  Treatment Plan Summary:Medication management and Plan see below   Assessment: GAD; Adjustment disorder with anxious mood; Insomnia  Medication management with supportive therapy. Risks/benefits and SE of the medication discussed. Pt verbalized understanding and verbal consent obtained for treatment. Affirm with the patient that the medications are taken as ordered. Patient expressed understanding of how their medications were to be used.  Meds: increase Zoloft to 150mg  po qD for anxiety and mood Propanolol 10mg  po BID anxiety Start trial of Vistaril 25-50mg  po qHS prn insomia  Labs: CBC WNL, CMP Na 134, Vit B12 WNL, TSH WNL, UDS neg  Therapy: brief supportive therapy provided. Discussed psychosocial stressors in detail.  Encouraged pt to develop daily routine and work  on daily goal setting as a way to improve mood symptoms.    Consultations: Declined therapy The patient was encouraged to keep all PCP and specialty clinic appointments.   Pt denies SI and is at an acute low risk for suicide. Patient told to call clinic if any problems occur. Patient advised to go to ER if they should develop SI/HI, side effects, or if symptoms worsen. Has crisis numbers to call if needed. Pt verbalized understanding.  F/up in 2 months or sooner if needed    Jamie Lopez 09/15/2015, 11:40 AM

## 2015-10-10 ENCOUNTER — Other Ambulatory Visit (HOSPITAL_COMMUNITY): Payer: Self-pay | Admitting: Psychiatry

## 2015-11-13 ENCOUNTER — Other Ambulatory Visit (HOSPITAL_COMMUNITY): Payer: Self-pay | Admitting: Psychiatry

## 2015-11-17 ENCOUNTER — Ambulatory Visit (INDEPENDENT_AMBULATORY_CARE_PROVIDER_SITE_OTHER): Payer: Federal, State, Local not specified - PPO | Admitting: Psychiatry

## 2015-11-17 ENCOUNTER — Encounter (HOSPITAL_COMMUNITY): Payer: Self-pay | Admitting: Psychiatry

## 2015-11-17 VITALS — BP 100/76 | HR 98 | Ht 62.5 in | Wt 169.6 lb

## 2015-11-17 DIAGNOSIS — F4322 Adjustment disorder with anxiety: Secondary | ICD-10-CM | POA: Diagnosis not present

## 2015-11-17 DIAGNOSIS — G4700 Insomnia, unspecified: Secondary | ICD-10-CM

## 2015-11-17 DIAGNOSIS — F411 Generalized anxiety disorder: Secondary | ICD-10-CM

## 2015-11-17 MED ORDER — SERTRALINE HCL 100 MG PO TABS: 150.0000 mg | ORAL_TABLET | Freq: Every day | ORAL | Status: DC

## 2015-11-17 MED ORDER — PROPRANOLOL HCL 10 MG PO TABS: 10.0000 mg | ORAL_TABLET | Freq: Two times a day (BID) | ORAL | Status: DC | PRN

## 2015-11-17 NOTE — Progress Notes (Addendum)
Patient ID: Jamie Lopez, female   DOB: 1996/02/24, 20 y.o.   MRN: [id number] Patient ID: Jamie Lopez, female   DOB: 10-02-1995, 20 y.o.   MRN: [id number] [facility] MD/PA/NP OP Progress Note  11/17/2015 3:48 PM Jamie Lopez  MRN:  [id number]  Subjective: No longer taking Vistaril due to SE of fatigue.   States mood is better. Pt denies depression. Denies anhedonia, isolation,  low motivation, poor hygiene, worthlessness and hopelessness. Denies SI/HI. Pt reports a hx of crying for hours infrequently.  Sleep remains poor due to racing thoughts. Pt is in summer school and is not sleeping much more than 5 hrs/night.  Appetite has improved but her schedule affects it.   Energy is low.  Concentration is good.   Pt has had a rare panic attacks in large crowds where she doesn't know most people. Pt finds she is avoiding going out. She has panic attacks when she thinks about dying.   Anxiety is going up. She has racing thoughts at night. Denies HA and fatigue. She worries about 1-2 hrs/day. Pt states it happens more when she is alone and not busy. Pt takes Propanolol 2 tabs at bedtime and then falls asleep  Taking meds as prescribed and denies SE.    Chief Complaint: "ok"  Chief Complaint    Follow-up     Visit Diagnosis:     ICD-9-CM ICD-10-CM   1. GAD (generalized anxiety disorder) 300.02 F41.1 sertraline (ZOLOFT) 100 MG tablet     propranolol (INDERAL) 10 MG tablet  2. Adjustment disorder with anxious mood 309.24 F43.22 sertraline (ZOLOFT) 100 MG tablet     propranolol (INDERAL) 10 MG tablet  3. Insomnia 780.52 G47.00     Past Medical History:  Past Medical History  Diagnosis Date  . Asthma   . Headache(784.0)   . Anxiety   . Seasonal allergies     Past Surgical History  Procedure Laterality Date  . No past surgeries     Past Psych Hx: Dx: GAD, panic attacks began at the age of 46 Meds: Amitriptyline- SE, Zoloft, Propanolol Previous psychiatrist/therapist: Dr. Gilmore Laroche,  previous therapist Faylene Kurtz. Hospitalizations: denies SIB: denies Suicide attempts: denies Hx of violent behavior towards others: denies Current access to guns: locked guns in her house Hx of abuse: denies Military Hx: denies   Family History:  Family History  Problem Relation Age of Onset  . Anxiety disorder Mother   . Diabetes Mellitus II Mother   . Depression Mother   . Anxiety disorder Maternal Grandfather   . Depression Maternal Grandfather   . Alcohol abuse Maternal Grandfather   . Drug abuse Maternal Grandfather   . Anxiety disorder Maternal Grandmother   . Diabetes Mellitus II Maternal Grandmother   . Depression Maternal Grandmother   . Alcohol abuse Paternal Grandfather   . Drug abuse Paternal Grandfather   . Bipolar disorder Cousin   . Schizophrenia Cousin   . Drug abuse Father    Social History:  Social History   Social History  . Marital Status: Single    Spouse Name: N/A  . Number of Children: 0  . Years of Education: 14   Occupational History  . student    Social History Main Topics  . Smoking status: Former Smoker    Quit date: 05/11/2013  . Smokeless tobacco: Never Used     Comment: Once month.  . Alcohol Use: Yes     Comment: a few drinks a few times a year  . Drug  Use: Yes    Special: Marijuana     Comment: randomly  . Sexual Activity:    Partners: Male    Birth Control/ Protection: Condom   Other Topics Concern  . None   Social History Narrative   Pt lives in [place] with mom and brother. Pt goes to [employer] and pt is a sophomore. Pt was born in [place] and raised by mom. Pt has 4 siblings and 1 on the way on her dad's side. Childhood was nice and she mostly raised as an child.  Pt works as an Engineer, manufacturing systemsundergraduate TA. Never married, no kids.        Musculoskeletal: Strength & Muscle Tone: within normal limits Gait & Station: normal Patient leans: straight  Psychiatric Specialty Exam: HPI  Review of Systems  Constitutional:  Negative for fever, chills and malaise/fatigue.  HENT: Negative for congestion and sore throat.   Eyes: Negative for blurred vision, double vision and redness.  Respiratory: Negative for cough, shortness of breath and wheezing.   Cardiovascular: Negative for chest pain, palpitations and leg swelling.  Gastrointestinal: Negative for heartburn, nausea, vomiting and abdominal pain.  Musculoskeletal: Negative for back pain, joint pain and neck pain.  Skin: Negative for itching and rash.  Neurological: Negative for dizziness, tremors, seizures, loss of consciousness and headaches.  Endo/Heme/Allergies: Positive for environmental allergies. Negative for polydipsia. Does not bruise/bleed easily.  Psychiatric/Behavioral: Negative for depression, suicidal ideas, hallucinations and substance abuse. The patient is nervous/anxious. The patient does not have insomnia.     Blood pressure 100/76, pulse 98, height 5' 2.5" (1.588 m), weight 169 lb 9.6 oz (76.93 kg).Body mass index is 30.51 kg/(m^2).  General Appearance: Fairly Groomed  Eye Contact:  Good  Speech:  Clear and Coherent and Normal Rate  Volume:  Normal  Mood:  Anxious  Affect:  Congruent  Thought Process:  Goal Directed  Orientation:  Full (Time, Place, and Person)  Thought Content:  Negative  Suicidal Thoughts:  No  Homicidal Thoughts:  No  Memory:  Immediate;   Fair Recent;   Fair Remote;   Fair  Judgement:  Intact  Insight:  Good  Psychomotor Activity:  Normal  Concentration:  Good  Recall:  Good  Fund of Knowledge: Good  Language: Good  Akathisia:  No  Handed:  Right  AIMS (if indicated):  n/a  Assets:  Communication Skills Desire for Improvement Financial Resources/Insurance Housing Leisure Time Physical Health Resilience Social Support Talents/Skills Transportation Vocational/Educational  ADL's:  Intact  Cognition: WNL  Sleep:  good   Is the patient at risk to self?  No. Has the patient been a risk to self in  the past 6 months?  No. Has the patient been a risk to self within the distant past?  No. Is the patient a risk to others?  No. Has the patient been a risk to others in the past 6 months?  No. Has the patient been a risk to others within the distant past?  No.  Current Medications: Current Outpatient Prescriptions  Medication Sig Dispense Refill  . cetirizine (ZYRTEC) 10 MG tablet Take 10 mg by mouth daily.    Marland Kitchen. levonorgestrel-ethinyl estradiol (SEASONALE,INTROVALE,JOLESSA) 0.15-0.03 MG tablet Take 1 tablet by mouth daily.    Marland Kitchen. PROAIR HFA 108 (90 BASE) MCG/ACT inhaler     . propranolol (INDERAL) 10 MG tablet Take 1 tablet (10 mg total) by mouth 2 (two) times daily as needed. 60 tablet 1  . sertraline (ZOLOFT) 100 MG tablet  Take 1.5 tablets (150 mg total) by mouth daily. 45 tablet 1  . hydrOXYzine (VISTARIL) 25 MG capsule Take 1 capsule (25 mg total) by mouth 2 (two) times daily as needed for anxiety (sleep). (Patient not taking: Reported on 11/17/2015) 60 capsule 1   No current facility-administered medications for this visit.    Medical Decision Making:  Established Problem, Stable/Improving (1), Review of Psycho-Social Stressors (1) and Review of Medication Regimen & Side Effects (2)  Treatment Plan Summary:Medication management and Plan see below   Assessment: GAD; Adjustment disorder with anxious mood; Insomnia  Medication management with supportive therapy. Risks/benefits and SE of the medication discussed. Pt verbalized understanding and verbal consent obtained for treatment. Affirm with the patient that the medications are taken as ordered. Patient expressed understanding of how their medications were to be used.  Meds:  Zoloft to 150mg  po qD for anxiety and mood Propanolol 10mg  po BID anxiety D/c Vistaril   Labs: CBC WNL, CMP Na 134, Vit B12 WNL, TSH WNL, UDS neg  Therapy: brief supportive therapy provided. Discussed psychosocial stressors in detail.  Encouraged pt to  develop daily routine and work on daily goal setting as a way to improve mood symptoms.    Consultations: Declined therapy The patient was encouraged to keep all PCP and specialty clinic appointments.   Pt denies SI and is at an acute low risk for suicide. Patient told to call clinic if any problems occur. Patient advised to go to ER if they should develop SI/HI, side effects, or if symptoms worsen. Has crisis numbers to call if needed. Pt verbalized understanding.  F/up in 3 months or sooner if needed    Oletta Darter 11/17/2015, 3:48 PM

## 2015-11-26 ENCOUNTER — Ambulatory Visit (HOSPITAL_COMMUNITY): Payer: Self-pay | Admitting: Psychiatry

## 2016-02-18 ENCOUNTER — Encounter (HOSPITAL_COMMUNITY): Payer: Self-pay | Admitting: Psychiatry

## 2016-02-18 ENCOUNTER — Ambulatory Visit (INDEPENDENT_AMBULATORY_CARE_PROVIDER_SITE_OTHER): Payer: Federal, State, Local not specified - PPO | Admitting: Psychiatry

## 2016-02-18 DIAGNOSIS — F411 Generalized anxiety disorder: Secondary | ICD-10-CM | POA: Diagnosis not present

## 2016-02-18 DIAGNOSIS — F4322 Adjustment disorder with anxiety: Secondary | ICD-10-CM

## 2016-02-18 MED ORDER — PROPRANOLOL HCL 10 MG PO TABS: 10.0000 mg | ORAL_TABLET | Freq: Two times a day (BID) | ORAL | 3 refills | Status: DC | PRN

## 2016-02-18 MED ORDER — SERTRALINE HCL 100 MG PO TABS: 150.0000 mg | ORAL_TABLET | Freq: Every day | ORAL | 3 refills | Status: DC

## 2016-02-18 NOTE — Progress Notes (Signed)
Patient ID: Jamie Lopez, female   DOB: 1996/03/21, 21 y.o.   MRN: [id number] Patient ID: Jamie Lopez, female   DOB: 05-May-1996, 20 y.o.   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 School is going ok but busy.   States mood is better. Pt denies depression. Denies anhedonia, isolation,  low motivation, poor hygiene, worthlessness and hopelessness. Denies SI/HI. Pt reports a hx of crying for hours infrequently.  Sleep remains poor due to racing thoughts. Pt is in school and is not sleeping much more than 5 hrs/night.  Appetite has improved but her schedule affects it.   Energy is low.  Concentration is good.   Pt has had a rare panic attacks in large crowds where she doesn't know most people or when driving. Pt finds she is avoiding going out. She has panic attacks when she thinks about dying.   Anxiety comes on when she is tired. She has racing thoughts at night. Denies HA and fatigue. She worries about 1-2 hrs/day. Pt states it happens more when she is alone and not busy. Pt takes Propanolol 2 tabs at bedtime and then falls asleep  Taking meds as prescribed and denies SE.    Chief Complaint: "ok"  Chief Complaint    Follow-up     Visit Diagnosis:     ICD-9-CM ICD-10-CM   1. GAD (generalized anxiety disorder) 300.02 F41.1 sertraline (ZOLOFT) 100 MG tablet     propranolol (INDERAL) 10 MG tablet  2. Adjustment disorder with anxious mood 309.24 F43.22 sertraline (ZOLOFT) 100 MG tablet     propranolol (INDERAL) 10 MG tablet    Past Medical History:  Past Medical History:  Diagnosis Date  . Anxiety   . Asthma   . Headache(784.0)   . Seasonal allergies     Past Surgical History:  Procedure Laterality Date  . NO PAST SURGERIES     Past Psych Hx: Dx: GAD, panic attacks began at the age of 70 Meds: Amitriptyline- SE, Zoloft, Propanolol Previous  psychiatrist/therapist: Dr. Gilmore Laroche, previous therapist Faylene Kurtz. Hospitalizations: denies SIB: denies Suicide attempts: denies Hx of violent behavior towards others: denies Current access to guns: locked guns in her house Hx of abuse: denies Military Hx: denies   Family History:  Family History  Problem Relation Age of Onset  . Anxiety disorder Mother   . Diabetes Mellitus II Mother   . Depression Mother   . Anxiety disorder Maternal Grandfather   . Depression Maternal Grandfather   . Alcohol abuse Maternal Grandfather   . Drug abuse Maternal Grandfather   . Anxiety disorder Maternal Grandmother   . Diabetes Mellitus II Maternal Grandmother   . Depression Maternal Grandmother   . Alcohol abuse Paternal Grandfather   . Drug abuse Paternal Grandfather   . Bipolar disorder Cousin   . Schizophrenia Cousin   . Drug abuse Father    Social History:  Social History   Social History  . Marital status: Single    Spouse name: N/A  . Number of children: 0  . Years of education: 14   Occupational History  . student    Social History Main Topics  . Smoking status: Former Smoker    Quit date: 05/11/2013  . Smokeless tobacco: Never Used     Comment: Once month.  . Alcohol use Yes     Comment: a few drinks a few  times a year  . Drug use:     Types: Marijuana     Comment: randomly  . Sexual activity: Yes    Partners: Male    Birth control/ protection: Condom   Other Topics Concern  . None   Social History Narrative   Pt lives in [place] with mom and brother. Pt goes to [employer] and pt is a sophomore. Pt was born in [place] and raised by mom. Pt has 4 siblings and 1 on the way on her dad's side. Childhood was nice and she mostly raised as an child.  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        Musculoskeletal: Strength & Muscle Tone: within normal limits Gait & Station: normal Patient leans: straight  Psychiatric Specialty Exam: HPI  Review  of Systems  Constitutional: Negative for chills, fever and malaise/fatigue.  HENT: Negative for congestion and sore throat.   Eyes: Negative for blurred vision, double vision and redness.  Respiratory: Negative for cough, shortness of breath and wheezing.   Cardiovascular: Negative for chest pain, palpitations and leg swelling.  Gastrointestinal: Negative for abdominal pain, heartburn, nausea and vomiting.  Musculoskeletal: Negative for back pain, joint pain and neck pain.  Skin: Negative for itching and rash.  Neurological: Negative for dizziness, tremors, seizures, loss of consciousness and headaches.  Endo/Heme/Allergies: Negative for environmental allergies and polydipsia. Does not bruise/bleed easily.  Psychiatric/Behavioral: Negative for depression, hallucinations, substance abuse and suicidal ideas. The patient is nervous/anxious. The patient does not have insomnia.     Blood pressure 102/68, pulse 88, height 5\' 2"  (1.575 m), weight 167 lb 3.2 oz (75.8 kg).Body mass index is 30.58 kg/m.  General Appearance: Fairly Groomed  Eye Contact:  Good  Speech:  Clear and Coherent and Normal Rate  Volume:  Normal  Mood:  Anxious  Affect:  Congruent  Thought Process:  Goal Directed  Orientation:  Full (Time, Place, and Person)  Thought Content:  Negative  Suicidal Thoughts:  No  Homicidal Thoughts:  No  Memory:  Immediate;   Fair Recent;   Fair Remote;   Fair  Judgement:  Intact  Insight:  Good  Psychomotor Activity:  Normal  Concentration:  Good  Recall:  Good  Fund of Knowledge: Good  Language: Good  Akathisia:  No  Handed:  Right  AIMS (if indicated):  n/a  Assets:  Communication Skills Desire for Improvement Financial Resources/Insurance Housing Leisure Time Physical Health Resilience Social Support Talents/Skills Transportation Vocational/Educational  ADL's:  Intact  Cognition: WNL  Sleep:  good   Is the patient at risk to self?  No. Has the patient been a risk  to self in the past 6 months?  No. Has the patient been a risk to self within the distant past?  No. Is the patient a risk to others?  No. Has the patient been a risk to others in the past 6 months?  No. Has the patient been a risk to others within the distant past?  No.  Current Medications: Current Outpatient Prescriptions  Medication Sig Dispense Refill  . cetirizine (ZYRTEC) 10 MG tablet Take 10 mg by mouth daily.    Marland Kitchen levonorgestrel-ethinyl estradiol (SEASONALE,INTROVALE,JOLESSA) 0.15-0.03 MG tablet Take 1 tablet by mouth daily.    Marland Kitchen PROAIR HFA 108 (90 BASE) MCG/ACT inhaler     . propranolol (INDERAL) 10 MG tablet Take 1 tablet (10 mg total) by mouth 2 (two) times daily as needed. 60 tablet 2  .  sertraline (ZOLOFT) 100 MG tablet Take 1.5 tablets (150 mg total) by mouth daily. 45 tablet 2   No current facility-administered medications for this visit.      Treatment Plan Summary:Medication management and Plan see below   Assessment: GAD; Adjustment disorder with anxious mood; Insomnia  Medication management with supportive therapy. Risks/benefits and SE of the medication discussed. Pt verbalized understanding and verbal consent obtained for treatment. Affirm with the patient that the medications are taken as ordered. Patient expressed understanding of how their medications were to be used.  Meds:  Zoloft to 150mg  po qD for anxiety and mood Propanolol 10mg  po BID anxiety   Labs: CBC WNL, CMP Na 134, Vit B12 WNL, TSH WNL, UDS neg  Therapy: brief supportive therapy provided. Discussed psychosocial stressors in detail.  Encouraged pt to develop daily routine and work on daily goal setting as a way to improve mood symptoms.    Consultations: Declined therapy The patient was encouraged to keep all PCP and specialty clinic appointments.   Pt denies SI and is at an acute low risk for suicide. Patient told to call clinic if any problems occur. Patient advised to go to ER if they  should develop SI/HI, side effects, or if symptoms worsen. Has crisis numbers to call if needed. Pt verbalized understanding.  F/up in 3 months or sooner if needed    Oletta DarterSalina Jaycey Gens 02/18/2016, 3:31 PM

## 2017-05-07 ENCOUNTER — Encounter (HOSPITAL_COMMUNITY): Payer: Self-pay | Admitting: Emergency Medicine

## 2017-05-07 ENCOUNTER — Other Ambulatory Visit: Payer: Self-pay

## 2017-05-07 ENCOUNTER — Emergency Department (HOSPITAL_COMMUNITY)
Admission: EM | Admit: 2017-05-07 | Discharge: 2017-05-07 | Disposition: A | Discharge status: Home / Self Care | Payer: Federal, State, Local not specified - PPO | Attending: Emergency Medicine | Admitting: Emergency Medicine

## 2017-05-07 DIAGNOSIS — Z87891 Personal history of nicotine dependence: Secondary | ICD-10-CM | POA: Insufficient documentation

## 2017-05-07 DIAGNOSIS — J45909 Unspecified asthma, uncomplicated: Secondary | ICD-10-CM | POA: Diagnosis not present

## 2017-05-07 DIAGNOSIS — Z79899 Other long term (current) drug therapy: Secondary | ICD-10-CM | POA: Insufficient documentation

## 2017-05-07 DIAGNOSIS — Z91010 Allergy to peanuts: Secondary | ICD-10-CM | POA: Insufficient documentation

## 2017-05-07 DIAGNOSIS — Z202 Contact with and (suspected) exposure to infections with a predominantly sexual mode of transmission: Secondary | ICD-10-CM | POA: Insufficient documentation

## 2017-05-07 LAB — COMPREHENSIVE METABOLIC PANEL
ALT: 23 U/L (ref 14–54)
AST: 24 U/L (ref 15–41)
Albumin: 3.4 g/dL — ABNORMAL LOW (ref 3.5–5.0)
Alkaline Phosphatase: 52 U/L (ref 38–126)
Anion gap: 9 (ref 5–15)
BUN: 8 mg/dL (ref 6–20)
CO2: 22 mmol/L (ref 22–32)
Calcium: 8.5 mg/dL — ABNORMAL LOW (ref 8.9–10.3)
Chloride: 106 mmol/L (ref 101–111)
Creatinine, Ser: 0.62 mg/dL (ref 0.44–1.00)
GFR calc Af Amer: >60 mL/min (ref >60)
GFR calc non Af Amer: >60 mL/min (ref >60)
Glucose, Bld: 93 mg/dL (ref 65–99)
Potassium: 3.8 mmol/L (ref 3.5–5.1)
Sodium: 137 mmol/L (ref 135–145)
Total Bilirubin: 0.3 mg/dL (ref 0.3–1.2)
Total Protein: 8 g/dL (ref 6.5–8.1)

## 2017-05-07 LAB — URINALYSIS, ROUTINE W REFLEX MICROSCOPIC
Bilirubin Urine: NEGATIVE
Glucose, UA: NEGATIVE mg/dL
Hgb urine dipstick: NEGATIVE
Ketones, ur: NEGATIVE mg/dL
Leukocytes, UA: NEGATIVE
Nitrite: NEGATIVE
Protein, ur: NEGATIVE mg/dL
Specific Gravity, Urine: 1.018 (ref 1.005–1.030)
pH: 6 (ref 5.0–8.0)

## 2017-05-07 LAB — WET PREP, GENITAL
Sperm: NONE SEEN
Trich, Wet Prep: NONE SEEN
Yeast Wet Prep HPF POC: NONE SEEN

## 2017-05-07 LAB — PREGNANCY, URINE: Preg Test, Ur: NEGATIVE

## 2017-05-07 MED ORDER — ELVITEG-COBIC-EMTRICIT-TENOFAF 150-150-200-10 MG PO TABS: 1.0000 | ORAL_TABLET | Freq: Every day | ORAL | 0 refills | Status: DC

## 2017-05-07 MED ORDER — ONDANSETRON 4 MG PO TBDP: 4.0000 mg | ORAL_TABLET | Freq: Three times a day (TID) | ORAL | 0 refills | Status: DC | PRN

## 2017-05-07 NOTE — ED Notes (Signed)
Medicines dispensed from Pharmacy ( HIV post exposure medicine) given to patient with instructions , encouraged to ask any concerns /questions regarding medicine. Pt. Spoke to Pharmacist for other concerns pt. Verbalized understanding.
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
## 2017-05-07 NOTE — Discharge Instructions (Addendum)
Antiretroviral therapy for possible exposure to HIV has been initiated.  Please take the prescribed medication daily.  Zofran has also been prescribed to be used as needed for nausea/vomiting. Please plan for repeat HIV antibody testing in 6 weeks, 3 months, and 6 months.  Please follow-up with infectious disease on this matter.  You have been tested for STDs, including HIV, gonorrhea, chlamydia, and hepatitis. Some of these results are still pending. Any abnormalities will be called to you.  Although you have been checked for STDs today, there has not been enough time for an infection to develop if you were exposed to a STD. Therefore, you should follow up with the [facility] or the Health department for any further testing or assistance with this matter.
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
## 2017-05-08 ENCOUNTER — Telehealth: Payer: Self-pay | Admitting: *Deleted

## 2017-05-08 NOTE — Care Management Note (Signed)
Case Management Note  CM consulted for HIV prophylactic medications.  CM will defer to SANE RN and their process.  No further CM needs noted at this time.

## 2017-05-08 NOTE — Telephone Encounter (Signed)
EDCM contacted pt to complete HIV nPEP Voucher.  Pt states she has insurance and has opted not to take Rx.  She will visit her OB for a check-up within the month.  No further EDCM needs identified.

## 2017-05-09 LAB — GC/CHLAMYDIA PROBE AMP (~~LOC~~) NOT AT ARMC
Chlamydia: NEGATIVE
Neisseria Gonorrhea: NEGATIVE

## 2017-05-09 LAB — HEPATITIS B SURFACE ANTIGEN: Hepatitis B Surface Ag: NEGATIVE

## 2017-05-10 LAB — HIV ANTIBODY (ROUTINE TESTING W REFLEX): HIV Screen 4th Generation wRfx: NONREACTIVE

## 2017-05-11 ENCOUNTER — Telehealth (HOSPITAL_COMMUNITY): Payer: Self-pay | Admitting: *Deleted

## 2017-05-12 NOTE — Consult Note (Deleted)
ON 05/08/2017, AT APPROXIMATELY 1112 HOURS, I WAS CONTACTED BY ANGELA, CM, IN REFERENCE TO OBTAINING THE ADVANCING ACCESS (AA) VOUCHER NUMBERS FOR THE PT THAT WAS PRESCRIBED GENVOYA (HIV nPEP) ON 05/07/2017.  AFTER SPEAKING WITH THE CM, I WAS ADVISED THAT THIS PT WAS NOT SEEN BY A SANE RN, AND THAT THE PT HAD BEEN GIVEN A PRINTED PRESCRIPTION FOR THE 23 DAY SUPPLY OF GENVOYA, IN ADDITION TO A 5 DAY SUPPLY FROM THE INPATIENT PHARMACY AT [place].  I THEN ADVISED THE CM THAT THE PT SHOULD BE CONTACTED BY CASE MANAGEMENT TO SEE IF SHE WOULD LIKE TO HAVE THE HIV nPEP MAILED TO HER FROM THE [facility] ([facility]), OR IF SHE WANTED TO PICK UP THE PRESCRIPTION FOR [facility] [place].    I FURTHER ADVISED THE CM THAT IF THE PT WANTED THE MEDICATIONS MAILED TO HER FROM THE [facility] THEN THE ADVANCING ACCESS VOUCHER COULD BE COMPLETED FOR THE PT, AND FAXED IN TO ADVANCING ACCESS.  I FURTHER ADVISED THE CM THAT ONCE ADVANCING ACCESS APPROVED THE MEDICATION ASSISTANCE THEN THE VOUCHER NUMBERS SHOULD BE EMAILED TO THE [facility], AND THE MEDS WOULD BE MAILED TO THE PT.   

## 2017-05-12 NOTE — SANE Note (Addendum)
ON 05/08/2017, AT APPROXIMATELY 1112 HOURS, I WAS CONTACTED BY ANGELA, CM, IN REFERENCE TO OBTAINING THE ADVANCING ACCESS (AA) VOUCHER NUMBERS FOR THE PT THAT WAS PRESCRIBED GENVOYA (HIV nPEP) ON 05/07/2017.  AFTER SPEAKING WITH THE CM, I WAS ADVISED THAT THIS PT WAS NOT SEEN BY A SANE RN, AND THAT THE PT HAD BEEN GIVEN A PRINTED PRESCRIPTION FOR THE 23 DAY SUPPLY OF GENVOYA, IN ADDITION TO A 5 DAY SUPPLY FROM THE INPATIENT PHARMACY AT [place].  I THEN ADVISED THE CM THAT THE PT SHOULD BE CONTACTED BY CASE MANAGEMENT TO SEE IF SHE WOULD LIKE TO HAVE THE HIV nPEP MAILED TO HER FROM THE [facility] ([facility]), OR IF SHE WANTED TO PICK UP THE PRES[facility] [place].    I FURTHER ADVISED THE CM THAT IF THE PT WANTED THE MEDICATIONS MAILED TO HER FROM THE [facility] THEN THE ADVANCING ACCESS VOUCHER COULD BE COMPLETED FOR THE PT, AND FAXED IN TO ADVANCING ACCESS.  I FURTHER ADVISED THE CM THAT ONCE ADVANCING ACCESS APPROVED THE MEDICATION ASSISTANCE THEN THE VOUCHER NUMBERS SHOULD BE EMAILED TO THE [facility], AND THE MEDS WOULD BE MAILED TO THE PT.

## 2017-06-11 LAB — HEMOGLOBIN A1C: Hemoglobin A1C: 5.5

## 2017-06-22 ENCOUNTER — Encounter (HOSPITAL_COMMUNITY): Payer: Self-pay | Admitting: Psychiatry

## 2017-06-22 ENCOUNTER — Ambulatory Visit (INDEPENDENT_AMBULATORY_CARE_PROVIDER_SITE_OTHER): Payer: Federal, State, Local not specified - PPO | Admitting: Psychiatry

## 2017-06-22 DIAGNOSIS — F1210 Cannabis abuse, uncomplicated: Secondary | ICD-10-CM | POA: Diagnosis not present

## 2017-06-22 DIAGNOSIS — G4700 Insomnia, unspecified: Secondary | ICD-10-CM

## 2017-06-22 DIAGNOSIS — F411 Generalized anxiety disorder: Secondary | ICD-10-CM

## 2017-06-22 DIAGNOSIS — Z813 Family history of other psychoactive substance abuse and dependence: Secondary | ICD-10-CM

## 2017-06-22 DIAGNOSIS — F99 Mental disorder, not otherwise specified: Secondary | ICD-10-CM

## 2017-06-22 DIAGNOSIS — R51 Headache: Secondary | ICD-10-CM

## 2017-06-22 DIAGNOSIS — Z87891 Personal history of nicotine dependence: Secondary | ICD-10-CM | POA: Diagnosis not present

## 2017-06-22 DIAGNOSIS — Z811 Family history of alcohol abuse and dependence: Secondary | ICD-10-CM

## 2017-06-22 DIAGNOSIS — F5105 Insomnia due to other mental disorder: Secondary | ICD-10-CM

## 2017-06-22 DIAGNOSIS — Z818 Family history of other mental and behavioral disorders: Secondary | ICD-10-CM

## 2017-06-22 MED ORDER — SERTRALINE HCL 100 MG PO TABS: 100.0000 mg | ORAL_TABLET | Freq: Every day | ORAL | 1 refills | Status: DC

## 2017-06-22 MED ORDER — PROPRANOLOL HCL 10 MG PO TABS
10.0000 mg | ORAL_TABLET | Freq: Two times a day (BID) | ORAL | 1 refills | Status: DC | PRN
Start: 2017-06-22 — End: 2017-08-10
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
## 2017-08-10 ENCOUNTER — Ambulatory Visit (INDEPENDENT_AMBULATORY_CARE_PROVIDER_SITE_OTHER): Payer: Federal, State, Local not specified - PPO | Admitting: Psychiatry

## 2017-08-10 ENCOUNTER — Encounter (HOSPITAL_COMMUNITY): Payer: Self-pay | Admitting: Psychiatry

## 2017-08-10 DIAGNOSIS — Z813 Family history of other psychoactive substance abuse and dependence: Secondary | ICD-10-CM

## 2017-08-10 DIAGNOSIS — Z811 Family history of alcohol abuse and dependence: Secondary | ICD-10-CM | POA: Diagnosis not present

## 2017-08-10 DIAGNOSIS — G4700 Insomnia, unspecified: Secondary | ICD-10-CM | POA: Diagnosis not present

## 2017-08-10 DIAGNOSIS — Z818 Family history of other mental and behavioral disorders: Secondary | ICD-10-CM | POA: Diagnosis not present

## 2017-08-10 DIAGNOSIS — Z87891 Personal history of nicotine dependence: Secondary | ICD-10-CM

## 2017-08-10 DIAGNOSIS — F411 Generalized anxiety disorder: Secondary | ICD-10-CM | POA: Diagnosis not present

## 2017-08-10 DIAGNOSIS — F1290 Cannabis use, unspecified, uncomplicated: Secondary | ICD-10-CM | POA: Diagnosis not present

## 2017-08-10 MED ORDER — PROPRANOLOL HCL 10 MG PO TABS: 10.0000 mg | ORAL_TABLET | Freq: Two times a day (BID) | ORAL | 2 refills | Status: DC | PRN

## 2017-08-10 MED ORDER — SERTRALINE HCL 100 MG PO TABS: 100.0000 mg | ORAL_TABLET | Freq: Every day | ORAL | 2 refills | Status: DC

## 2017-08-10 NOTE — Progress Notes (Signed)
BH MD/PA/NP OP Progress Note  08/10/2017 1:23 PM Tagen Brethauer  MRN:  [id number]  Chief Complaint:  Chief Complaint    Anxiety; Follow-up     HPI: "better". She has a more positive attitude. At first she felt numb for about 2 weeks then it resolved. Pt is going to see a school counselor next week. Anxiety is "way better". She is no longer having racing thoughts at night. Pt is able to drive at night again. Things that used to make her anxious no longer due. She has taken on even more activites and likes to busy. When she has time to sleep she gets anywhere from 5-8 hrs. Pt has only felt down twice in the last 6 weeks. It was due to stress and feeling overwhelmed. One of those days she isolated. Pt denies SI/HI.   Pt states-taking meds as prescribed and denies SE.   Visit Diagnosis:    ICD-10-CM   1. GAD (generalized anxiety disorder) F41.1 sertraline (ZOLOFT) 100 MG tablet    propranolol (INDERAL) 10 MG tablet      Past Psychiatric History:  Dx: GAD, panic attacks began at the age of 7 Meds: Amitriptyline- SE, Zoloft, Propanolol Previous psychiatrist/therapist: Dr. Gilmore Laroche, previous therapist Faylene Kurtz. Hospitalizations: denies SIB: denies Suicide attempts: denies Hx of violent behavior towards others: denies Current access to guns: locked guns in her house Hx of abuse: denies Military Hx: denies   Past Medical History:  Past Medical History:  Diagnosis Date  . Anxiety   . Asthma   . Headache(784.0)   . Rosacea   . Seasonal allergies     Past Surgical History:  Procedure Laterality Date  . NO PAST SURGERIES      Family Psychiatric History:  Family History  Problem Relation Age of Onset  . Anxiety disorder Mother   . Diabetes Mellitus II Mother   . Depression Mother   . Anxiety disorder Maternal Grandfather   . Depression Maternal Grandfather   . Alcohol abuse Maternal Grandfather   . Drug abuse Maternal Grandfather   . Anxiety disorder Maternal  Grandmother   . Diabetes Mellitus II Maternal Grandmother   . Depression Maternal Grandmother   . Alcohol abuse Paternal Grandfather   . Drug abuse Paternal Grandfather   . Bipolar disorder Cousin   . Schizophrenia Cousin   . Drug abuse Father     Social History:  Social History   Socioeconomic History  . Marital status: Single    Spouse name: None  . Number of children: 0  . Years of education: 28  . Highest education level: None  Social Needs  . Financial resource strain: None  . Food insecurity - worry: None  . Food insecurity - inability: None  . Transportation needs - medical: None  . Transportation needs - non-medical: None  Occupational History  . Occupation: student    Comment: [employer]  Tobacco Use  . Smoking status: Former Smoker    Last attempt to quit: 05/11/2013    Years since quitting: 4.2  . Smokeless tobacco: Never Used  . Tobacco comment: Once month.  Substance and Sexual Activity  . Alcohol use: Yes    Comment: a few drinks a few times a year  . Drug use: Yes    Types: Marijuana    Comment: quit in 2018  . Sexual activity: Yes    Partners: Male    Birth control/protection: Condom  Other Topics Concern  . None  Social History Narrative  Pt lives in [place] with mom and brother. Pt goes to  [employer] and pt is a sophomore. Pt was born in [place] and raised by mom. Pt has 4 siblings and 1 on the way on her dad's side. Childhood was nice and she mostly raised as an child.  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       Allergies:  Allergies  Allergen Reactions  . Fruit & Vegetable Daily [Nutritional Supplements]   . Peanut-Containing Drug Products     Tree nut specifically     Metabolic Disorder Labs: No results found for: HGBA1C, MPG No results found for: PROLACTIN No results found for: CHOL, TRIG, HDL, CHOLHDL, VLDL, LDLCALC Lab Results  Component Value Date   TSH 0.722 06/19/2015    Therapeutic Level Labs: No results  found for: LITHIUM No results found for: VALPROATE No components found for:  CBMZ  Current Medications: Current Outpatient Medications  Medication Sig Dispense Refill  . cetirizine (ZYRTEC) 10 MG tablet Take 10 mg by mouth daily.    Marland Kitchen levonorgestrel-ethinyl estradiol (SEASONALE,INTROVALE,JOLESSA) 0.15-0.03 MG tablet Take 1 tablet by mouth daily.    . ondansetron (ZOFRAN ODT) 4 MG disintegrating tablet Take 1 tablet (4 mg total) by mouth every 8 (eight) hours as needed for nausea or vomiting. 20 tablet 0  . PRESCRIPTION MEDICATION     . PROAIR HFA 108 (90 BASE) MCG/ACT inhaler     . propranolol (INDERAL) 10 MG tablet Take 1 tablet (10 mg total) by mouth 2 (two) times daily as needed. 60 tablet 2  . sertraline (ZOLOFT) 100 MG tablet Take 1 tablet (100 mg total) by mouth daily. 30 tablet 2   No current facility-administered medications for this visit.      Musculoskeletal: Strength & Muscle Tone: within normal limits Gait & Station: normal Patient leans: N/A  Psychiatric Specialty Exam: ROS  Blood pressure 124/72, pulse 73, height 5\' 2"  (1.575 m), weight 168 lb (76.2 kg).Body mass index is 30.73 kg/m.  General Appearance: Fairly Groomed  Eye Contact:  Good  Speech:  Clear and Coherent and Normal Rate  Volume:  Normal  Mood:  Euthymic  Affect:  Full Range  Thought Process:  Goal Directed and Descriptions of Associations: Intact  Orientation:  Full (Time, Place, and Person)  Thought Content: Logical   Suicidal Thoughts:  No  Homicidal Thoughts:  No  Memory:  Immediate;   Good Recent;   Good Remote;   Good  Judgement:  Good  Insight:  Good  Psychomotor Activity:  Normal  Concentration:  Concentration: Good and Attention Span: Good  Recall:  Good  Fund of Knowledge: Good  Language: Good  Akathisia:  No  Handed:  Right  AIMS (if indicated): not done  Assets:  Communication Skills Desire for Improvement Financial Resources/Insurance Housing Leisure Time Social  Support Talents/Skills Transportation Vocational/Educational  ADL's:  Intact  Cognition: WNL  Sleep:  Fair   Screenings:   Assessment and Plan: GAD; insomnia    Medication management with supportive therapy. Risks and benefits, side effects and alternative treatment options discussed with patient. Pt was given an opportunity to ask questions about medication, illness, and treatment. All current psychiatric medications have been reviewed and discussed with the patient and adjusted as clinically appropriate. The patient has been provided an accurate and updated list of the medications being now prescribed. Patient expressed understanding of how their medications were to be used.  Pt verbalized understanding and verbal consent obtained  for treatment.  The risk of un-intended pregnancy is low based on the fact that pt reports is taking OCP. Pt is aware that these meds carry a teratogenic risk. Pt will discuss plan of action if she does or plans to become pregnant in the future.  Status of current problems: improved  Meds: Zoloft 100 mg p.o. daily for GAD Propranolol 10 mg p.o. twice daily for GAD   Labs: none  Therapy: brief supportive therapy provided. Discussed psychosocial stressors in detail.   Encouraged pt to develop daily routine and work on daily goal setting as a way to improve mood symptoms.  Reviewed sleep hygiene in detail Recommended pt stop all drug and alcohol use  Consultations: Encouraged to follow up with PCP as needed  Pt denies SI and is at an acute low risk for suicide. Patient told to call clinic if any problems occur. Patient advised to go to ER if they should develop SI/HI, side effects, or if symptoms worsen. Has crisis numbers to call if needed. Pt verbalized understanding.  F/up in 3 months or sooner if needed    Oletta DarterSalina Desa Rech, MD 08/10/2017, 1:23 PM

## 2017-09-29 ENCOUNTER — Emergency Department (HOSPITAL_COMMUNITY)
Admission: EM | Admit: 2017-09-29 | Discharge: 2017-09-29 | Disposition: A | Discharge status: Home / Self Care | Payer: Federal, State, Local not specified - PPO | Attending: Emergency Medicine | Admitting: Emergency Medicine

## 2017-09-29 ENCOUNTER — Encounter (HOSPITAL_COMMUNITY): Payer: Self-pay | Admitting: Emergency Medicine

## 2017-09-29 DIAGNOSIS — R110 Nausea: Secondary | ICD-10-CM | POA: Insufficient documentation

## 2017-09-29 DIAGNOSIS — Z87891 Personal history of nicotine dependence: Secondary | ICD-10-CM | POA: Insufficient documentation

## 2017-09-29 DIAGNOSIS — J45901 Unspecified asthma with (acute) exacerbation: Secondary | ICD-10-CM | POA: Diagnosis not present

## 2017-09-29 DIAGNOSIS — R1084 Generalized abdominal pain: Secondary | ICD-10-CM | POA: Insufficient documentation

## 2017-09-29 DIAGNOSIS — Z793 Long term (current) use of hormonal contraceptives: Secondary | ICD-10-CM | POA: Insufficient documentation

## 2017-09-29 DIAGNOSIS — R0602 Shortness of breath: Secondary | ICD-10-CM | POA: Insufficient documentation

## 2017-09-29 DIAGNOSIS — F419 Anxiety disorder, unspecified: Secondary | ICD-10-CM | POA: Diagnosis not present

## 2017-09-29 DIAGNOSIS — R0789 Other chest pain: Secondary | ICD-10-CM | POA: Diagnosis not present

## 2017-09-29 DIAGNOSIS — F410 Panic disorder [episodic paroxysmal anxiety] without agoraphobia: Secondary | ICD-10-CM | POA: Diagnosis present

## 2017-09-29 MED ORDER — ALBUTEROL SULFATE (2.5 MG/3ML) 0.083% IN NEBU
5.0000 mg | INHALATION_SOLUTION | Freq: Once | RESPIRATORY_TRACT | Status: AC
  Administered 2017-09-29: 5 mg via RESPIRATORY_TRACT
  Filled 2017-09-29: qty 6

## 2017-09-29 MED ORDER — ONDANSETRON 4 MG PO TBDP
4.0000 mg | ORAL_TABLET | Freq: Once | ORAL | Status: AC
  Administered 2017-09-29: 4 mg via ORAL
  Filled 2017-09-29: qty 1

## 2017-09-29 MED ORDER — ALBUTEROL SULFATE HFA 108 (90 BASE) MCG/ACT IN AERS: 1.0000 | INHALATION_SPRAY | Freq: Four times a day (QID) | RESPIRATORY_TRACT | 0 refills | Status: DC | PRN
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
## 2017-09-29 NOTE — ED Provider Notes (Signed)
[facility]   CSN: [id number] Arrival date & time: 09/29/17  1457     History   Chief Complaint Chief Complaint  Patient presents with  . Anxiety    HPI Jamie Lopez is a 22 y.o. female with history of asthma and anxiety who presents with chest tightness and shortness of breath.  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  She reports she ran outside.  She has had tightness in her chest and difficulty breathing.  She is also had some chest pain.  She reports generalized abdominal pain.  She reports she was drinking a soda prior to this happening.  She also has not eaten anything today.  She has some associated nausea, but no vomiting.  She reports it feels like she might need a breathing treatment, as she has had asthma in the past and needed them.  She also takes medication for anxiety.  She believes that she may have a combination of her asthma and a panic attack.  HPI  Past Medical History:  Diagnosis Date  . Anxiety   . Asthma   . Headache(784.0)   . Rosacea   . Seasonal allergies     Patient Active Problem List   Diagnosis Date Noted  . GAD (generalized anxiety disorder) 10/25/2012  . Migraines 10/25/2012    Past Surgical History:  Procedure Laterality Date  . NO PAST SURGERIES       OB History   None      Home Medications    Prior to Admission medications   Medication Sig Start Date End Date Taking? Authorizing Provider  albuterol (PROVENTIL HFA;VENTOLIN HFA) 108 (90 Base) MCG/ACT inhaler Inhale 1-2 puffs into the lungs every 6 (six) hours as needed for wheezing or shortness of breath. 09/29/17   Amire Leazer, Waylan Boga, PA-C  cetirizine (ZYRTEC) 10 MG tablet Take 10 mg by mouth daily.    [provider]  levonorgestrel-ethinyl estradiol (SEASONALE,INTROVALE,JOLESSA) 0.15-0.03 MG tablet Take 1 tablet by mouth daily.    [provider]  ondansetron (ZOFRAN ODT) 4 MG disintegrating tablet Take 1 tablet (4 mg total) by mouth every 8 (eight) hours as needed for nausea or vomiting. 05/07/17   Anselm Pancoast, PA-C  PRESCRIPTION MEDICATION     [provider]  propranolol (INDERAL) 10 MG tablet Take 1 tablet (10 mg total) by mouth 2 (two) times daily as needed. 08/10/17   Oletta Darter, MD  sertraline (ZOLOFT) 100 MG tablet Take 1 tablet (100 mg total) by mouth daily. 08/10/17   Oletta Darter, MD    Family History Family History  Problem Relation Age of Onset  . Anxiety disorder Mother   . Diabetes Mellitus II Mother   . Depression Mother   . Anxiety disorder Maternal Grandfather   . Depression Maternal Grandfather   . Alcohol abuse Maternal Grandfather   . Drug abuse Maternal Grandfather   . Anxiety disorder Maternal Grandmother   . Diabetes Mellitus II Maternal Grandmother   . Depression Maternal Grandmother   . Alcohol abuse Paternal Grandfather   . Drug abuse Paternal Grandfather   . Bipolar disorder Cousin   . Schizophrenia Cousin   . Drug abuse Father     Social History Social History   Tobacco Use  . Smoking status: Former Smoker    Last attempt to quit: 05/11/2013    Years since quitting: 4.3  .  Smokeless tobacco: Never Used  . Tobacco comment: Once month.  Substance Use Topics  . Alcohol use: Yes    Comment: a few drinks a few times a year  . Drug use: Yes    Types: Marijuana    Comment: quit in 2018     Allergies   Fruit & vegetable daily [nutritional supplements] and Peanut-containing drug products   Review of Systems Review of Systems  Constitutional: Negative for fever.  Respiratory: Positive for cough (started today after the incident), chest tightness and shortness of breath.   Cardiovascular: Positive for chest pain.  Psychiatric/Behavioral: The patient is nervous/anxious.      Physical Exam Updated Vital Signs BP 122/80 (BP Location: Right Arm)   Pulse  (!) 103   Temp 98 F (36.7 C) (Oral)   Resp 18   SpO2 100%   Physical Exam  Constitutional: She appears well-developed and well-nourished. No distress.  HENT:  Head: Normocephalic and atraumatic.  Mouth/Throat: Oropharynx is clear and moist. No oropharyngeal exudate.  Eyes: Pupils are equal, round, and reactive to light. Conjunctivae are normal. Right eye exhibits no discharge. Left eye exhibits no discharge. No scleral icterus.  Neck: Normal range of motion. Neck supple. No thyromegaly present.  Cardiovascular: Regular rhythm, normal heart sounds and intact distal pulses. Exam reveals no gallop and no friction rub.  No murmur heard. Pulmonary/Chest: Effort normal and breath sounds normal. No stridor. No respiratory distress. She has no wheezes. She has no rales. She exhibits no tenderness.  Abdominal: Soft. Bowel sounds are normal. She exhibits no distension. There is generalized tenderness. There is no rebound and no guarding.  Musculoskeletal: She exhibits no edema.  Lymphadenopathy:    She has no cervical adenopathy.  Neurological: She is alert. Coordination normal.  Skin: Skin is warm and dry. No rash noted. She is not diaphoretic. No pallor.  Psychiatric: She has a normal mood and affect.  Nursing note and vitals reviewed.    ED Treatments / Results  Labs (all labs ordered are listed, but only abnormal results are displayed) Labs Reviewed - No data to display  EKG EKG Interpretation  Date/Time:  Friday Sep 29 2017 15:29:46 EDT Ventricular Rate:  88 PR Interval:    QRS Duration: 82 QT Interval:  352 QTC Calculation: 426 R Axis:   73 Text Interpretation:  Sinus rhythm Probable left atrial enlargement Low voltage, precordial leads No acute changes Nonspecific ST and T wave abnormality Confirmed by Derwood Kaplan [phone]) on 09/29/2017 4:07:21 PM   Radiology No results found.  Procedures Procedures (including critical care time)  Medications Ordered in  ED Medications  ondansetron (ZOFRAN-ODT) disintegrating tablet 4 mg (4 mg Oral Given 09/29/17 1530)  albuterol (PROVENTIL) (2.5 MG/3ML) 0.083% nebulizer solution 5 mg (5 mg Nebulization Given 09/29/17 1544)     Initial Impression / Assessment and Plan / ED Course  I have reviewed the triage vital signs and the nursing notes.  Pertinent labs & imaging results that were available during my care of the patient were reviewed by me and considered in my medical decision making (see chart for details).     Patient presenting following anxiety versus asthma attack.  Lungs were clear to auscultation, however patient is feeling much improved after albuterol and Zofran.  She has also had a snack to eat.  Her chest pain, shortness of breath, and chest tightness has resolved.  On reexamination, patient no longer having abdominal tenderness and states she is just hungry.  EKG shows  NSR, nonspecific ST and T wave abnormalities, however no acute changes.  Will discharge home with albuterol inhaler.  Considering no wheezing and questionable to be completely related to asthma, no indication for prednisone at this time.  Patient advised to follow-up with her PCP.  Strict return precautions discussed.  Patient understands and agrees with plan.  Patient vitals stable throughout ED course and discharged in satisfactory condition.  Final Clinical Impressions(s) / ED Diagnoses   Final diagnoses:  Anxiety  Asthma with acute exacerbation, unspecified asthma severity, unspecified whether persistent    ED Discharge Orders        Ordered    albuterol (PROVENTIL HFA;VENTOLIN HFA) 108 (90 Base) MCG/ACT inhaler  Every 6 hours PRN     09/29/17 [id number], PA-C 09/29/17 1708    Derwood Kaplan, MD 10/04/17 1751

## 2017-09-29 NOTE — Discharge Instructions (Signed)
Please follow-up with your doctor about today's episode.  Use inhaler every 4-6 hours as needed for shortness of breath, wheezing, or chest tightness.  Please return to emergency department if you develop any new or worsening symptoms.

## 2017-11-09 ENCOUNTER — Ambulatory Visit (INDEPENDENT_AMBULATORY_CARE_PROVIDER_SITE_OTHER): Payer: Federal, State, Local not specified - PPO | Admitting: Psychiatry

## 2017-11-09 ENCOUNTER — Encounter (HOSPITAL_COMMUNITY): Payer: Self-pay | Admitting: Psychiatry

## 2017-11-09 DIAGNOSIS — Z818 Family history of other mental and behavioral disorders: Secondary | ICD-10-CM | POA: Diagnosis not present

## 2017-11-09 DIAGNOSIS — Z87891 Personal history of nicotine dependence: Secondary | ICD-10-CM | POA: Diagnosis not present

## 2017-11-09 DIAGNOSIS — Z811 Family history of alcohol abuse and dependence: Secondary | ICD-10-CM

## 2017-11-09 DIAGNOSIS — Z79899 Other long term (current) drug therapy: Secondary | ICD-10-CM

## 2017-11-09 DIAGNOSIS — F411 Generalized anxiety disorder: Secondary | ICD-10-CM

## 2017-11-09 DIAGNOSIS — Z813 Family history of other psychoactive substance abuse and dependence: Secondary | ICD-10-CM

## 2017-11-09 MED ORDER — PROPRANOLOL HCL 10 MG PO TABS
10.0000 mg | ORAL_TABLET | Freq: Two times a day (BID) | ORAL | 3 refills | Status: DC | PRN
Start: 2017-11-09 — End: 2018-02-06

## 2017-11-09 MED ORDER — SERTRALINE HCL 100 MG PO TABS: 100.0000 mg | ORAL_TABLET | Freq: Every day | ORAL | 3 refills | Status: DC
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
## 2017-11-15 ENCOUNTER — Other Ambulatory Visit (HOSPITAL_COMMUNITY): Payer: Self-pay | Admitting: Psychiatry

## 2017-11-15 DIAGNOSIS — F411 Generalized anxiety disorder: Secondary | ICD-10-CM

## 2017-12-28 ENCOUNTER — Ambulatory Visit (HOSPITAL_COMMUNITY): Payer: Federal, State, Local not specified - PPO | Admitting: Psychiatry

## 2017-12-28 ENCOUNTER — Encounter

## 2018-01-08 DIAGNOSIS — Z6834 Body mass index (BMI) 34.0-34.9, adult: Secondary | ICD-10-CM | POA: Diagnosis not present

## 2018-01-08 DIAGNOSIS — Z113 Encounter for screening for infections with a predominantly sexual mode of transmission: Secondary | ICD-10-CM | POA: Diagnosis not present

## 2018-01-08 DIAGNOSIS — Z01419 Encounter for gynecological examination (general) (routine) without abnormal findings: Secondary | ICD-10-CM | POA: Diagnosis not present

## 2018-01-31 DIAGNOSIS — Z3202 Encounter for pregnancy test, result negative: Secondary | ICD-10-CM | POA: Diagnosis not present

## 2018-01-31 DIAGNOSIS — N898 Other specified noninflammatory disorders of vagina: Secondary | ICD-10-CM | POA: Diagnosis not present

## 2018-01-31 DIAGNOSIS — Z3009 Encounter for other general counseling and advice on contraception: Secondary | ICD-10-CM | POA: Diagnosis not present

## 2018-01-31 DIAGNOSIS — Z113 Encounter for screening for infections with a predominantly sexual mode of transmission: Secondary | ICD-10-CM | POA: Diagnosis not present

## 2018-02-06 ENCOUNTER — Other Ambulatory Visit (HOSPITAL_COMMUNITY): Payer: Self-pay | Admitting: Psychiatry

## 2018-02-06 DIAGNOSIS — F411 Generalized anxiety disorder: Secondary | ICD-10-CM

## 2018-02-27 DIAGNOSIS — Z6839 Body mass index (BMI) 39.0-39.9, adult: Secondary | ICD-10-CM | POA: Diagnosis not present

## 2018-02-27 DIAGNOSIS — J029 Acute pharyngitis, unspecified: Secondary | ICD-10-CM | POA: Diagnosis not present

## 2018-02-28 ENCOUNTER — Encounter: Payer: Self-pay | Admitting: Family Medicine

## 2018-02-28 DIAGNOSIS — Z6839 Body mass index (BMI) 39.0-39.9, adult: Secondary | ICD-10-CM | POA: Diagnosis not present

## 2018-02-28 DIAGNOSIS — J029 Acute pharyngitis, unspecified: Secondary | ICD-10-CM | POA: Diagnosis not present

## 2018-03-15 ENCOUNTER — Ambulatory Visit (INDEPENDENT_AMBULATORY_CARE_PROVIDER_SITE_OTHER): Payer: Federal, State, Local not specified - PPO | Admitting: Psychiatry

## 2018-03-15 ENCOUNTER — Encounter (HOSPITAL_COMMUNITY): Payer: Self-pay | Admitting: Psychiatry

## 2018-03-15 DIAGNOSIS — F411 Generalized anxiety disorder: Secondary | ICD-10-CM | POA: Diagnosis not present

## 2018-03-15 MED ORDER — PROPRANOLOL HCL 10 MG PO TABS: 10.0000 mg | ORAL_TABLET | Freq: Two times a day (BID) | ORAL | 2 refills | Status: DC | PRN

## 2018-03-15 MED ORDER — SERTRALINE HCL 100 MG PO TABS: 100.0000 mg | ORAL_TABLET | Freq: Every day | ORAL | 3 refills | Status: DC

## 2018-03-15 NOTE — Progress Notes (Signed)
BH MD/PA/NP OP Progress Note  03/15/2018 3:46 PM Jamie Lopez  MRN:  [id number]  Chief Complaint:  Chief Complaint    Follow-up     HPI: Patient is working third shift at [employer].  She states that it is very busy but not hard.  She has trouble falling asleep after getting home from work.  Sometimes it takes 1 or 2 hours for her to fall asleep.  She does feel fatigued but is managing it.  2 months ago she started a new oral contraceptive pill.  Since then she is noted a change in her mood.  She feels down when she is not busy.  On 2 occasions she has cried randomly.  Her mother has commented on her mood being depressed.  Patient feels that it has to do with the new pill and plans to follow up with her gynecologist to change the medication.  She denies SI/HI.  She is taking her Zoloft and propranolol as prescribed.  She does not want either of these meds changed today.  Visit Diagnosis:    ICD-10-CM   1. GAD (generalized anxiety disorder) F41.1 propranolol (INDERAL) 10 MG tablet    sertraline (ZOLOFT) 100 MG tablet      Past Psychiatric History:  Dx: GAD, panic attacks began at the age of 32 Meds: Amitriptyline- SE, Zoloft, Propanolol Previous psychiatrist/therapist: Dr. Gilmore Laroche, previous therapist Faylene Kurtz. Hospitalizations: denies SIB: denies Suicide attempts: denies Hx of violent behavior towards others: denies Current access to guns: locked guns in her house Hx of abuse: denies Military Hx: denies    Past Medical History:  Past Medical History:  Diagnosis Date  . Anxiety   . Asthma   . Headache(784.0)   . Rosacea   . Seasonal allergies     Past Surgical History:  Procedure Laterality Date  . NO PAST SURGERIES      Family Psychiatric History:  Family History  Problem Relation Age of Onset  . Anxiety disorder Mother   . Diabetes Mellitus II Mother   . Depression Mother   . Anxiety disorder Maternal Grandfather   . Depression Maternal Grandfather   .  Alcohol abuse Maternal Grandfather   . Drug abuse Maternal Grandfather   . Anxiety disorder Maternal Grandmother   . Diabetes Mellitus II Maternal Grandmother   . Depression Maternal Grandmother   . Alcohol abuse Paternal Grandfather   . Drug abuse Paternal Grandfather   . Bipolar disorder Cousin   . Schizophrenia Cousin   . Drug abuse Father     Social History:  Social History   Socioeconomic History  . Marital status: Single    Spouse name: Not on file  . Number of children: 0  . Years of education: 26  . Highest education level: Not on file  Occupational History  . Occupation: student    Comment: [employer]  Social Needs  . Financial resource strain: Not on file  . Food insecurity:    Worry: Not on file    Inability: Not on file  . Transportation needs:    Medical: Not on file    Non-medical: Not on file  Tobacco Use  . Smoking status: Former Smoker    Last attempt to quit: 05/11/2013    Years since quitting: 4.8  . Smokeless tobacco: Never Used  . Tobacco comment: Once month.  Substance and Sexual Activity  . Alcohol use: Yes    Comment: social  . Drug use: Not Currently    Types:  Marijuana    Comment: quit in 2018  . Sexual activity: Yes    Partners: Male    Birth control/protection: Condom  Lifestyle  . Physical activity:    Days per week: Not on file    Minutes per session: Not on file  . Stress: Not on file  Relationships  . Social connections:    Talks on phone: Not on file    Gets together: Not on file    Attends religious service: Not on file    Active member of club or organization: Not on file    Attends meetings of clubs or organizations: Not on file    Relationship status: Not on file  Other Topics Concern  . Not on file  Social History Narrative   Pt lives in [place] with mom and brother. Pt goes to [employer] and pt is a sophomore. Pt was born in [place] and raised by mom. Pt has 4 siblings and 1 on the way on her dad's side. Childhood was  nice and she mostly raised as an child.  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       Allergies:  Allergies  Allergen Reactions  . Fruit & Vegetable Daily [Nutritional Supplements]   . Peanut-Containing Drug Products     Tree nut specifically     Metabolic Disorder Labs: No results found for: HGBA1C, MPG No results found for: PROLACTIN No results found for: CHOL, TRIG, HDL, CHOLHDL, VLDL, LDLCALC Lab Results  Component Value Date   TSH 0.722 06/19/2015    Therapeutic Level Labs: No results found for: LITHIUM No results found for: VALPROATE No components found for:  CBMZ  Current Medications: Current Outpatient Medications  Medication Sig Dispense Refill  . albuterol (PROVENTIL HFA;VENTOLIN HFA) 108 (90 Base) MCG/ACT inhaler Inhale 1-2 puffs into the lungs every 6 (six) hours as needed for wheezing or shortness of breath. 1 Inhaler 0  . cetirizine (ZYRTEC) 10 MG tablet Take 10 mg by mouth daily.    Marland Kitchen levonorgestrel-ethinyl estradiol (SEASONALE,INTROVALE,JOLESSA) 0.15-0.03 MG tablet Take 1 tablet by mouth daily.    . ondansetron (ZOFRAN ODT) 4 MG disintegrating tablet Take 1 tablet (4 mg total) by mouth every 8 (eight) hours as needed for nausea or vomiting. 20 tablet 0  . PRESCRIPTION MEDICATION     . propranolol (INDERAL) 10 MG tablet Take 1 tablet (10 mg total) by mouth 2 (two) times daily as needed. 60 tablet 2  . rizatriptan (MAXALT-MLT) 10 MG disintegrating tablet Take 10 mg by mouth as needed for migraine. May repeat in 2 hours if needed    . sertraline (ZOLOFT) 100 MG tablet Take 1 tablet (100 mg total) by mouth daily. 30 tablet 3   No current facility-administered medications for this visit.      Musculoskeletal: Strength & Muscle Tone: within normal limits Gait & Station: normal Patient leans: N/A  Psychiatric Specialty Exam: Review of Systems  Constitutional: Negative for chills, diaphoresis and fever.  Psychiatric/Behavioral: Positive for  depression. Negative for hallucinations and suicidal ideas. The patient has insomnia. The patient is not nervous/anxious.     Blood pressure 122/74, pulse 80, height 5' 2.5" (1.588 m), weight 183 lb (83 kg).Body mass index is 32.94 kg/m.  General Appearance: Fairly Groomed  Eye Contact:  Good  Speech:  Clear and Coherent and Normal Rate  Volume:  Normal  Mood:  Depressed  Affect:  Full Range  Thought Process:  Goal Directed and Descriptions of  Associations: Intact  Orientation:  Full (Time, Place, and Person)  Thought Content:  Logical  Suicidal Thoughts:  No  Homicidal Thoughts:  No  Memory:  Immediate;   Good  Judgement:  Good  Insight:  Good  Psychomotor Activity:  Normal  Concentration:  Concentration: Good  Recall:  Good  Fund of Knowledge:  Good  Language:  Good  Akathisia:  No  Handed:  Right  AIMS (if indicated):     Assets:  Communication Skills Desire for Improvement Housing Leisure Time Physical Health Resilience Social Support Talents/Skills Transportation Vocational/Educational  ADL's:  Intact  Cognition:  WNL  Sleep:   poor     Screenings:  Viewed the information below on 03/15/2018 and have updated it Assessment and Plan: GAD; insomnia    Medication management with supportive therapy. Risks and benefits, side effects and alternative treatment options discussed with patient. Pt was given an opportunity to ask questions about medication, illness, and treatment. All current psychiatric medications have been reviewed and discussed with the patient and adjusted as clinically appropriate. The patient has been provided an accurate and updated list of the medications being now prescribed. Patient expressed understanding of how their medications were to be used.  Pt verbalized understanding and verbal consent obtained for treatment.  The risk of un-intended pregnancy is low based on the fact that pt reports is taking OCP. Pt is aware that these meds carry a  teratogenic risk. Pt will discuss plan of action if she does or plans to become pregnant in the future.  Status of current problems: improved anxiety but worsening mood  Meds: Zoloft 100 mg p.o. daily for GAD Propranolol 10 mg p.o. twice daily for GAD   Labs: none  Therapy: brief supportive therapy provided. Discussed psychosocial stressors in detail.     Consultations: Encouraged to follow up with PCP as needed  Pt denies SI and is at an acute low risk for suicide. Patient told to call clinic if any problems occur. Patient advised to go to ER if they should develop SI/HI, side effects, or if symptoms worsen. Has crisis numbers to call if needed. Pt verbalized understanding.  F/up in 2 months or sooner if needed     Oletta Darter, MD 03/15/2018, 3:46 PM
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
## 2018-04-13 ENCOUNTER — Ambulatory Visit: Payer: Federal, State, Local not specified - PPO | Admitting: Family Medicine

## 2018-04-13 ENCOUNTER — Other Ambulatory Visit: Payer: Self-pay

## 2018-04-13 ENCOUNTER — Encounter: Payer: Self-pay | Admitting: Family Medicine

## 2018-04-13 VITALS — BP 125/82 | HR 89 | Temp 98.5°F | Resp 18 | Ht 62.5 in | Wt 182.8 lb

## 2018-04-13 DIAGNOSIS — N926 Irregular menstruation, unspecified: Secondary | ICD-10-CM | POA: Diagnosis not present

## 2018-04-13 DIAGNOSIS — Z7689 Persons encountering health services in other specified circumstances: Secondary | ICD-10-CM | POA: Diagnosis not present

## 2018-04-13 DIAGNOSIS — E6609 Other obesity due to excess calories: Secondary | ICD-10-CM

## 2018-04-13 DIAGNOSIS — F410 Panic disorder [episodic paroxysmal anxiety] without agoraphobia: Secondary | ICD-10-CM

## 2018-04-13 DIAGNOSIS — F411 Generalized anxiety disorder: Secondary | ICD-10-CM | POA: Diagnosis not present

## 2018-04-13 DIAGNOSIS — Z6832 Body mass index (BMI) 32.0-32.9, adult: Secondary | ICD-10-CM

## 2018-04-13 NOTE — Patient Instructions (Signed)
° ° ° °  If you have lab work done today you will be contacted with your lab results within the next 2 weeks.  If you have not heard from us then please contact us. The fastest way to get your results is to register for My Chart. ° ° °IF you received an x-ray today, you will receive an invoice from [facility]. Please contact [facility] at [phone] with questions or concerns regarding your invoice.  ° °IF you received labwork today, you will receive an invoice from [facility]. Please contact [facility] at [phone] with questions or concerns regarding your invoice.  ° °Our billing staff will not be able to assist you with questions regarding bills from these companies. ° °You will be contacted with the lab results as soon as they are available. The fastest way to get your results is to activate your My Chart account. Instructions are located on the last page of this paperwork. If you have not heard from us regarding the results in 2 weeks, please contact this office. °  ° ° ° °

## 2018-05-11 ENCOUNTER — Encounter: Payer: Self-pay | Admitting: Family Medicine

## 2018-05-11 NOTE — Progress Notes (Signed)
Pap exam impression: Negative for intraepithelial lesion and malignancy, Negative for intraepithelial lesion, satisfactory for evaluation. Endocervical and/or squamous metaplastic cells, Endocervical component are present.

## 2018-05-14 ENCOUNTER — Encounter: Payer: Self-pay | Admitting: Family Medicine

## 2018-05-17 ENCOUNTER — Ambulatory Visit (HOSPITAL_COMMUNITY): Payer: Federal, State, Local not specified - PPO | Admitting: Psychiatry

## 2018-05-31 ENCOUNTER — Other Ambulatory Visit: Payer: Self-pay

## 2018-05-31 ENCOUNTER — Emergency Department (HOSPITAL_COMMUNITY): Payer: BLUE CROSS/BLUE SHIELD

## 2018-05-31 ENCOUNTER — Ambulatory Visit: Payer: Self-pay | Admitting: *Deleted

## 2018-05-31 ENCOUNTER — Encounter (HOSPITAL_COMMUNITY): Payer: Self-pay | Admitting: *Deleted

## 2018-05-31 ENCOUNTER — Encounter

## 2018-05-31 ENCOUNTER — Emergency Department (HOSPITAL_COMMUNITY)
Admission: EM | Admit: 2018-05-31 | Discharge: 2018-05-31 | Disposition: A | Discharge status: Home / Self Care | Payer: BLUE CROSS/BLUE SHIELD | Attending: Emergency Medicine | Admitting: Emergency Medicine

## 2018-05-31 DIAGNOSIS — Z79899 Other long term (current) drug therapy: Secondary | ICD-10-CM | POA: Diagnosis not present

## 2018-05-31 DIAGNOSIS — R0789 Other chest pain: Secondary | ICD-10-CM | POA: Insufficient documentation

## 2018-05-31 DIAGNOSIS — R591 Generalized enlarged lymph nodes: Secondary | ICD-10-CM | POA: Insufficient documentation

## 2018-05-31 LAB — CBC
HCT: 37.8 % (ref 36.0–46.0)
Hemoglobin: 12.1 g/dL (ref 12.0–15.0)
MCH: 29.5 pg (ref 26.0–34.0)
MCHC: 32 g/dL (ref 30.0–36.0)
MCV: 92.2 fL (ref 80.0–100.0)
Platelets: 307 10*3/uL (ref 150–400)
RBC: 4.1 MIL/uL (ref 3.87–5.11)
RDW: 12.7 % (ref 11.5–15.5)
WBC: 3.9 10*3/uL — ABNORMAL LOW (ref 4.0–10.5)
nRBC: 0 % (ref 0.0–0.2)

## 2018-05-31 LAB — BASIC METABOLIC PANEL
Anion gap: 8 (ref 5–15)
BUN: 13 mg/dL (ref 6–20)
CO2: 22 mmol/L (ref 22–32)
Calcium: 8.6 mg/dL — ABNORMAL LOW (ref 8.9–10.3)
Chloride: 107 mmol/L (ref 98–111)
Creatinine, Ser: 0.7 mg/dL (ref 0.44–1.00)
GFR calc Af Amer: >60 mL/min (ref >60)
GFR calc non Af Amer: >60 mL/min (ref >60)
Glucose, Bld: 93 mg/dL (ref 70–99)
Potassium: 3.5 mmol/L (ref 3.5–5.1)
Sodium: 137 mmol/L (ref 135–145)

## 2018-05-31 LAB — POCT I-STAT TROPONIN I: Troponin i, poc: 0 ng/mL (ref 0.00–0.08)

## 2018-05-31 LAB — I-STAT BETA HCG BLOOD, ED (NOT ORDERABLE): I-stat hCG, quantitative: <5 m[IU]/mL (ref <5)

## 2018-05-31 NOTE — Discharge Instructions (Signed)

## 2018-05-31 NOTE — Telephone Encounter (Signed)
Pt called with complaints of feeling like she has a bug bite on the back of her neck( noticed 5-7 days ago); she says that the area is warm to the touch, and the lymph nodes on the right near her throat are swollen; she says that it hurts to turn her neck sometimes; the pt says that she has felt light headed twice in the 2 days (05/28/18 and 05/29/18) but that has gotten better with her drinking caffiene; recommendations made per nurse triage protocol; pt normally sees Dr Collie Siad but this provider has no availability; pt offered and accepted appointment with Dr Koren Shiver, [address], 06/01/18 at 0920; she verbalized understanding; also pt would like to be put on the wait list to be seen today in the event that someone cancels. Reason for Disposition . [1] Red or very tender (to touch) area AND [2] started over 24 hours after the bite  Answer Assessment - Initial Assessment Questions 1. TYPE of INSECT: "What type of insect was it?"      Not sure 2. ONSET: "When did you get bitten?"      5-7 days ago 3. LOCATION: "Where is the insect bite located?"     Back right side of neck 4. REDNESS: "Is the area red or pink?" If so, ask "What size is area of redness?" (inches or cm). "When did the redness start?"     Can not visualile 5. PAIN: "Is there any pain?" If so, ask: "How bad is it?"  (Scale 1-10; or mild, moderate, severe)     5-6 out of 10 6. ITCHING: "Does it itch?" If so, ask: "How bad is the itch?"    - MILD: doesn't interfere with normal activities   - MODERATE - SEVERE: interferes with work, school, sleep, or other activities      Intemittent; mild 7. SWELLING: "How big is the swelling?" (inches, cm, or compare to coins)     Feels hard and swollen; width of a nickle 8. OTHER SYMPTOMS: "Do you have any other symptoms?"  (e.g., difficulty breathing, hives)     Area painful to touch 9. PREGNANCY: "Is there any chance you are pregnant?" "When was your last menstrual period?"     No  LMP 05/14/18  Protocols used: INSECT BITE-A-AH

## 2018-05-31 NOTE — ED Triage Notes (Signed)
Pt reports left chest pain that began that started at 7am-8am when pt woke up.  Pt reports that the pain is intermittent.  Pt states that the pain increases with inspiration.  Pt also reports a bite on the back of pt's neck.  Pt denies SOB and stated that her pain increased while driving back from her family's home in [place]. No diaphorsis noted.  Hx Asthma, panic disorder, seasonal allergies and migraines. Pt a/o x 4.

## 2018-05-31 NOTE — ED Notes (Signed)
EKG given to Dr. Leo Rod.

## 2018-05-31 NOTE — ED Provider Notes (Signed)
[facility]-EMERGENCY DEPT Provider Note   CSN: [id number] Arrival date & time: 05/31/18  1641     History   Chief Complaint Chief Complaint  Patient presents with  . Chest Pain    HPI Jamie Lopez is a 23 y.o. female.  HPI   Pt 23 y/o female with h/o asthma (childhood), HA, allergies, who presents to the ED today c/o left sided chest pain that began when she woke up this morning. Pain feels dulls but when she breathes deep or yawns the pain feels sharp. Pain is worse when lifting up her left arm. Pain 3/10 when sitting but 6/10 when yawning. Has tried using 800mg  ibuprofen which improved sxs. Chest pain is intermittent.   She denies SOB.   Denies leg pain/swelling, hemoptysis, recent surgery/trauma, recent long travel, hormone use, personal hx of cancer, or hx of DVT/PE.   States she has had a rare mild cough over the last few weeks. Cough is nonproductive. No other uri sxs or fevers.  States she got bit on the back of the neck a few days ago and has had pain. Has associated lymph node swelling to this area.  Past Medical History:  Diagnosis Date  . Anxiety   . Asthma   . Headache(784.0)   . Rosacea   . Seasonal allergies     Patient Active Problem List   Diagnosis Date Noted  . GAD (generalized anxiety disorder) 10/25/2012  . Migraines 10/25/2012    Past Surgical History:  Procedure Laterality Date  . NO PAST SURGERIES       OB History   No obstetric history on file.      Home Medications    Prior to Admission medications   Medication Sig Start Date End Date Taking? Authorizing Provider  albuterol (PROVENTIL HFA;VENTOLIN HFA) 108 (90 Base) MCG/ACT inhaler Inhale 1-2 puffs into the lungs every 6 (six) hours as needed for wheezing or shortness of breath. 09/29/17   Law, Waylan Boga, PA-C  budesonide (RHINOCORT ALLERGY) 32 MCG/ACT nasal spray Place 1 spray into both nostrils daily.    [provider]  cetirizine (ZYRTEC) 10 MG  tablet Take 10 mg by mouth daily.    [provider]  Olopatadine HCl (PATADAY) 0.2 % SOLN Apply to eye.    [provider]  ondansetron (ZOFRAN ODT) 4 MG disintegrating tablet Take 1 tablet (4 mg total) by mouth every 8 (eight) hours as needed for nausea or vomiting. 05/07/17   Anselm Pancoast, PA-C  PRESCRIPTION MEDICATION     [provider]  propranolol (INDERAL) 10 MG tablet Take 1 tablet (10 mg total) by mouth 2 (two) times daily as needed. 03/15/18   Oletta Darter, MD  rizatriptan (MAXALT-MLT) 10 MG disintegrating tablet Take 10 mg by mouth as needed for migraine. May repeat in 2 hours if needed    [provider]  sertraline (ZOLOFT) 100 MG tablet Take 1 tablet (100 mg total) by mouth daily. 03/15/18   Oletta Darter, MD    Family History Family History  Problem Relation Age of Onset  . Anxiety disorder Mother   . Diabetes Mellitus II Mother   . Depression Mother   . Anxiety disorder Maternal Grandfather   . Depression Maternal Grandfather   . Alcohol abuse Maternal Grandfather   . Drug abuse Maternal Grandfather   . Anxiety disorder Maternal Grandmother   . Diabetes Mellitus II Maternal Grandmother   . Depression Maternal Grandmother   . Alcohol abuse  Paternal Grandfather   . Drug abuse Paternal Grandfather   . Bipolar disorder Cousin   . Schizophrenia Cousin   . Drug abuse Father     Social History Social History   Tobacco Use  . Smoking status: Former Smoker    Last attempt to quit: 05/11/2013    Years since quitting: 5.0  . Smokeless tobacco: Never Used  . Tobacco comment: Once month.  Substance Use Topics  . Alcohol use: Yes    Comment: social  . Drug use: Not Currently    Types: Marijuana    Comment: quit in 2018     Allergies   Fruit & vegetable daily [nutritional supplements] and Peanut-containing drug products   Review of Systems Review of Systems  Constitutional: Negative for fever.  HENT: Negative for  congestion, postnasal drip, rhinorrhea and sore throat.   Eyes: Negative for photophobia.  Respiratory: Positive for cough (rare). Negative for shortness of breath and wheezing.   Cardiovascular: Positive for chest pain.  Gastrointestinal: Negative for abdominal pain, constipation, diarrhea, nausea and vomiting.  Genitourinary: Negative for flank pain and pelvic pain.  Musculoskeletal: Negative for back pain.  Skin:       Suspected insect bite  Neurological: Negative for headaches.  Hematological: Positive for adenopathy.   Physical Exam Updated Vital Signs BP 124/85 (BP Location: Right Arm)   Pulse 73   Temp 98.4 F (36.9 C) (Oral)   Resp 16   Ht 5\' 3"  (1.6 m)   Wt 81.6 kg   LMP 05/14/2018   SpO2 100%   BMI 31.89 kg/m   Physical Exam Vitals signs and nursing note reviewed.  Constitutional:      General: She is not in acute distress.    Appearance: She is well-developed. She is not ill-appearing or toxic-appearing.  HENT:     Head: Normocephalic and atraumatic.     Comments: Occipital lymphadenopathy present Eyes:     Conjunctiva/sclera: Conjunctivae normal.  Neck:     Musculoskeletal: Neck supple.  Cardiovascular:     Rate and Rhythm: Normal rate and regular rhythm.     Heart sounds: No murmur.  Pulmonary:     Effort: Pulmonary effort is normal. No respiratory distress.     Breath sounds: Normal breath sounds. No decreased breath sounds, wheezing, rhonchi or rales.     Comments: Left chest wall TTP that reproduces pain Abdominal:     Palpations: Abdomen is soft.     Tenderness: There is no abdominal tenderness.  Musculoskeletal:     Right lower leg: She exhibits no tenderness. No edema.     Left lower leg: She exhibits no tenderness. No edema.  Lymphadenopathy:     Cervical: Cervical adenopathy (posterior cervical chain) present.  Skin:    General: Skin is warm and dry.  Neurological:     Mental Status: She is alert.  Psychiatric:        Mood and Affect:  Mood normal.      ED Treatments / Results  Labs (all labs ordered are listed, but only abnormal results are displayed) Labs Reviewed  BASIC METABOLIC PANEL - Abnormal; Notable for the following components:      Result Value   Calcium 8.6 (*)    All other components within normal limits  CBC - Abnormal; Notable for the following components:   WBC 3.9 (*)    All other components within normal limits  I-STAT TROPONIN, ED  I-STAT BETA HCG BLOOD, ED ([facility], [facility], [facility] ONLY)  I-STAT BETA HCG BLOOD, ED (NOT ORDERABLE)  POCT I-STAT TROPONIN I    EKG None  Radiology Dg Chest 2 View  Result Date: 05/31/2018 CLINICAL DATA:  Intermittent left-sided chest pain today. EXAM: CHEST - 2 VIEW COMPARISON:  None. FINDINGS: The heart size and mediastinal contours are within normal limits. Both lungs are clear. The visualized skeletal structures are unremarkable. IMPRESSION: No active cardiopulmonary disease. Electronically Signed   By: Obie DredgeWilliam T Derry M.D.   On: 05/31/2018 18:06    Procedures Procedures (including critical care time)  Medications Ordered in ED Medications - No data to display   Initial Impression / Assessment and Plan / ED Course  I have reviewed the triage vital signs and the nursing notes.  Pertinent labs & imaging results that were available during my care of the patient were reviewed by me and considered in my medical decision making (see chart for details).     Final Clinical Impressions(s) / ED Diagnoses   Final diagnoses:  Atypical chest pain  Lymphadenopathy   Pt with left sided chest pain beginning this AM. Has been intermittent. Worse with movement of her arm and and with yawning. Has chest wall ttp on exam that reproduces pain. Vitals normal. Lungs clear. Pt not sob. Suspect msk chest pain. Doubt pe or other abnormality. Also concerned for possible insect bite on neck on exam no evidence of skin infection or abscess. She does have some LAD. Pt is well appearing and  feel she is appropriate for discharge with close f/u with pcp. Advised to return if worse. She voices understanding of the plan and reasons to return. All questions answered.  ED Discharge Orders    None       Rayne DuCouture, Alica Shellhammer S, PA-C 05/31/18 2057    Charlynne PanderYao, David Hsienta, MD 05/31/18 [phone]

## 2018-06-01 ENCOUNTER — Ambulatory Visit: Payer: Self-pay | Admitting: *Deleted

## 2018-06-01 ENCOUNTER — Ambulatory Visit: Payer: Self-pay | Admitting: Family Medicine

## 2018-06-01 ENCOUNTER — Emergency Department (HOSPITAL_COMMUNITY)
Admission: EM | Admit: 2018-06-01 | Discharge: 2018-06-01 | Disposition: A | Discharge status: Home / Self Care | Payer: BLUE CROSS/BLUE SHIELD | Attending: Emergency Medicine | Admitting: Emergency Medicine

## 2018-06-01 ENCOUNTER — Other Ambulatory Visit: Payer: Self-pay

## 2018-06-01 ENCOUNTER — Encounter (HOSPITAL_COMMUNITY): Payer: Self-pay | Admitting: Emergency Medicine

## 2018-06-01 DIAGNOSIS — R079 Chest pain, unspecified: Secondary | ICD-10-CM | POA: Diagnosis not present

## 2018-06-01 DIAGNOSIS — Z87891 Personal history of nicotine dependence: Secondary | ICD-10-CM | POA: Diagnosis not present

## 2018-06-01 DIAGNOSIS — R590 Localized enlarged lymph nodes: Secondary | ICD-10-CM | POA: Insufficient documentation

## 2018-06-01 DIAGNOSIS — Z79899 Other long term (current) drug therapy: Secondary | ICD-10-CM | POA: Insufficient documentation

## 2018-06-01 DIAGNOSIS — J45909 Unspecified asthma, uncomplicated: Secondary | ICD-10-CM | POA: Insufficient documentation

## 2018-06-01 DIAGNOSIS — R0789 Other chest pain: Secondary | ICD-10-CM | POA: Insufficient documentation

## 2018-06-01 DIAGNOSIS — L040 Acute lymphadenitis of face, head and neck: Secondary | ICD-10-CM | POA: Diagnosis not present

## 2018-06-01 MED ORDER — NAPROXEN 500 MG PO TABS: 500.0000 mg | ORAL_TABLET | Freq: Two times a day (BID) | ORAL | 0 refills | Status: DC

## 2018-06-01 MED ORDER — LIDOCAINE 5 % EX PTCH
1.0000 | MEDICATED_PATCH | CUTANEOUS | Status: DC
  Administered 2018-06-01: 1 via TRANSDERMAL
  Filled 2018-06-01: qty 1

## 2018-06-01 MED ORDER — AMOXICILLIN-POT CLAVULANATE 875-125 MG PO TABS: 1.0000 | ORAL_TABLET | Freq: Two times a day (BID) | ORAL | 0 refills | Status: DC

## 2018-06-01 NOTE — ED Triage Notes (Signed)
Per pt, states she was seen here yesterday for the same symptoms-states chest wall pain not getting better-states pain with breathing-took Tylenol with no relief

## 2018-06-01 NOTE — Telephone Encounter (Signed)
Pt called due to being seen at [facility] ED on 05/31/17; she cancelled her previously scheduled appointment at [facility] on 06/01/18; she says that her chest pain with deep inspiration is worse; she has taken 1000 mg of tylenol with no relief; she rates her pain at 6 out of 7; recommendations made per nurse triage protocol; the pt will return to ED as directed per AVS dated 05/31/2018.  Reason for Disposition . [1] MILD difficulty breathing (e.g., minimal/no SOB at rest, SOB with walking, pulse <100) AND [2] NEW-onset or WORSE than normal  Answer Assessment - Initial Assessment Questions 1. RESPIRATORY STATUS: "Describe your breathing?" (e.g., wheezing, shortness of breath, unable to speak, severe coughing)      Pain with inspiration 2. ONSET: "When did this breathing problem begin?"      Seen in ED 05/31/2018 3. PATTERN "Does the difficult breathing come and go, or has it been constant since it started?"      constant 4. SEVERITY: "How bad is your breathing?" (e.g., mild, moderate, severe)    - MILD: No SOB at rest, mild SOB with walking, speaks normally in sentences, can lay down, no retractions, pulse < 100.    - MODERATE: SOB at rest, SOB with minimal exertion and prefers to sit, cannot lie down flat, speaks in phrases, mild retractions, audible wheezing, pulse 100-120.    - SEVERE: Very SOB at rest, speaks in single words, struggling to breathe, sitting hunched forward, retractions, pulse > 120      mild 5. RECURRENT SYMPTOM: "Have you had difficulty breathing before?" If so, ask: "When was the last time?" and "What happened that time?"      Seen in ED 05/31/2018 6. CARDIAC HISTORY: "Do you have any history of heart disease?" (e.g., heart attack, angina, bypass surgery, angioplasty)      no 7. LUNG HISTORY: "Do you have any history of lung disease?"  (e.g., pulmonary embolus, asthma, emphysema)     asthma 8. CAUSE: "What do you think is causing the breathing problem?"      Not sure 9. OTHER  SYMPTOMS: "Do you have any other symptoms? (e.g., dizziness, runny nose, cough, chest pain, fever)     no 10. PREGNANCY: "Is there any chance you are pregnant?" "When was your last menstrual period?" No LMP 05/14/18 11. TRAVEL: "Have you traveled out of the country in the last month?" (e.g., travel history, exposures)       no  Protocols used: BREATHING DIFFICULTY-A-AH

## 2018-06-01 NOTE — ED Provider Notes (Signed)
[facility]-EMERGENCY DEPT Provider Note   CSN: [id number] Arrival date & time: 06/01/18  1006     History   Chief Complaint Chief Complaint  Jamie Lopez presents with  . Chest Pain    HPI Jamie Lopez is a 23 y.o. Jamie Lopez.  HPI  23 year old Jamie Lopez presents with a 2-day history of chest wall pain.  Jamie Lopez states symptoms started suddenly yesterday morning.  [facility] and diagnosed with chest wall pain and adenopathy.  Jamie Lopez at that time had a full work-up including EKG, chest x-ray and troponin.  No acute findings found on testing yesterday.  Jamie Lopez states Jamie Lopez tried to not lift anything heavy at work yesterday but has had persistent pain which prompted Jamie Lopez to come in.  Jamie Lopez states pain is of the left chest wall when it is palpated or when Jamie Lopez moves Jamie Lopez left arm.  Jamie Lopez denies any associated nausea, vomiting, fevers, chills, cough, rhinorrhea, shortness of breath.  Jamie Lopez does note swollen lymph nodes in the occipital region.  Jamie Lopez denies any other treatment besides Tylenol.  Jamie Lopez denies any change in Jamie Lopez chest wall pain from yesterday.  In the last 3 months Jamie Lopez denies any recent travel/trauma/immobilization, peripheral edema.  Jamie Lopez denies being on OCPs.   Past Medical History:  Diagnosis Date  . Anxiety   . Asthma   . Headache(784.0)   . Rosacea   . Seasonal allergies     Jamie Lopez Active Problem List   Diagnosis Date Noted  . GAD (generalized anxiety disorder) 10/25/2012  . Migraines 10/25/2012    Past Surgical History:  Procedure Laterality Date  . NO PAST SURGERIES       OB History   No obstetric history on file.      Home Medications    Prior to Admission medications   Medication Sig Start Date End Date Taking? Authorizing Provider  albuterol (PROVENTIL HFA;VENTOLIN HFA) 108 (90 Base) MCG/ACT inhaler Inhale 1-2 puffs into the lungs every 6 (six) hours as needed for wheezing or shortness of breath. 09/29/17   Law, Waylan BogaAlexandra M,  PA-C  budesonide (RHINOCORT ALLERGY) 32 MCG/ACT nasal spray Place 1 spray into both nostrils daily.    [provider]  cetirizine (ZYRTEC) 10 MG tablet Take 10 mg by mouth daily.    [provider]  Olopatadine HCl (PATADAY) 0.2 % SOLN Apply to eye.    [provider]  ondansetron (ZOFRAN ODT) 4 MG disintegrating tablet Take 1 tablet (4 mg total) by mouth every 8 (eight) hours as needed for nausea or vomiting. 05/07/17   Anselm PancoastJoy, Shawn C, PA-C  PRESCRIPTION MEDICATION     [provider]  propranolol (INDERAL) 10 MG tablet Take 1 tablet (10 mg total) by mouth 2 (two) times daily as needed. 03/15/18   Oletta DarterAgarwal, [place], MD  rizatriptan (MAXALT-MLT) 10 MG disintegrating tablet Take 10 mg by mouth as needed for migraine. May repeat in 2 hours if needed    [provider]  sertraline (ZOLOFT) 100 MG tablet Take 1 tablet (100 mg total) by mouth daily. 03/15/18   Oletta DarterAgarwal, [place], MD    Family History Family History  Problem Relation Age of Onset  . Anxiety disorder Mother   . Diabetes Mellitus II Mother   . Depression Mother   . Anxiety disorder Maternal Grandfather   . Depression Maternal Grandfather   . Alcohol abuse Maternal Grandfather   . Drug abuse Maternal Grandfather   . Anxiety disorder Maternal  Grandmother   . Diabetes Mellitus II Maternal Grandmother   . Depression Maternal Grandmother   . Alcohol abuse Paternal Grandfather   . Drug abuse Paternal Grandfather   . Bipolar disorder Cousin   . Schizophrenia Cousin   . Drug abuse Father     Social History Social History   Tobacco Use  . Smoking status: Former Smoker    Last attempt to quit: 05/11/2013    Years since quitting: 5.0  . Smokeless tobacco: Never Used  . Tobacco comment: Once month.  Substance Use Topics  . Alcohol use: Yes    Comment: social  . Drug use: Not Currently    Types: Marijuana    Comment: quit in 2018     Allergies   Fruit & vegetable daily [nutritional  supplements] and Peanut-containing drug products   Review of Systems Review of Systems  Constitutional: Negative for chills and fever.  HENT: Negative for ear pain, facial swelling and rhinorrhea.   Respiratory: Negative for shortness of breath.   Cardiovascular: Negative for chest pain.  Gastrointestinal: Negative for abdominal pain, nausea and vomiting.  Musculoskeletal: Positive for myalgias (chest wall pain).  Skin: Negative for rash and wound.       Tender cervical lymphadenopathy     Physical Exam Updated Vital Signs BP 122/84 (BP Location: Right Arm)   Pulse 88   Temp 99.2 F (37.3 C) (Oral)   Resp 18   LMP 05/14/2018   SpO2 100%   Physical Exam Vitals signs and nursing note reviewed.  Constitutional:      Appearance: Jamie Lopez is well-developed.  HENT:     Head: Normocephalic and atraumatic.  Eyes:     Conjunctiva/sclera: Conjunctivae normal.  Neck:     Musculoskeletal: Neck supple.  Cardiovascular:     Rate and Rhythm: Normal rate and regular rhythm.     Heart sounds: Normal heart sounds. No murmur.  Pulmonary:     Effort: Pulmonary effort is normal. No respiratory distress.     Breath sounds: Normal breath sounds. No wheezing or rales.  Chest:     Chest wall: Tenderness (pinpoint tenderness over left chestwall, approx between 4th and 5th ribs) present. No deformity or swelling.  Abdominal:     General: Bowel sounds are normal. There is no distension.     Palpations: Abdomen is soft.     Tenderness: There is no abdominal tenderness.  Musculoskeletal: Normal range of motion.        General: No tenderness or deformity.  Lymphadenopathy:     Cervical: Cervical adenopathy (posterior right) present.     Upper Body:     Right upper body: No supraclavicular or axillary adenopathy.     Left upper body: No supraclavicular or axillary adenopathy.  Skin:    General: Skin is warm and dry.     Findings: No erythema or rash.  Neurological:     Mental Status: Jamie Lopez is  alert and oriented to person, place, and time.  Psychiatric:        Behavior: Behavior normal.      ED Treatments / Results  Labs (all labs ordered are listed, but only abnormal results are displayed) Labs Reviewed - No data to display  EKG None  Radiology Dg Chest 2 View  Result Date: 05/31/2018 CLINICAL DATA:  Intermittent left-sided chest pain today. EXAM: CHEST - 2 VIEW COMPARISON:  None. FINDINGS: The heart size and mediastinal contours are within normal limits. Both lungs are clear. The visualized skeletal structures  are unremarkable. IMPRESSION: No active cardiopulmonary disease. Electronically Signed   By: Obie Dredge M.D.   On: 05/31/2018 18:06    Procedures Procedures (including critical care time)  Medications Ordered in ED Medications  lidocaine (LIDODERM) 5 % 1 patch (has no administration in time range)     Initial Impression / Assessment and Plan / ED Course  I have reviewed the triage vital signs and the nursing notes.  Pertinent labs & imaging results that were available during my care of the Jamie Lopez were reviewed by me and considered in my medical decision making (see chart for details).     Presented with a 2-day history of chest wall pain.  Jamie Lopez resting comfortably in bed, no acute distress, nontoxic, non-lethargic.  Vital signs stable.  Jamie Lopez had a full work-up yesterday including troponin, chest x-ray, EKG.  No acute findings were found in work-up yesterday.  Jamie Lopez denies any change in Jamie Lopez pain that prompted Jamie Lopez to come in today.  Jamie Lopez states Jamie Lopez came in because of no improvement after Tylenol. EKG was repeated with no acute findings today.   Jamie Lopez is acutely tender over the left chest wall.  Leave this is likely musculoskeletal chest wall pain.  Encouraged Lidoderm patch and NSAIDs.  Will write for naproxen.  Regarding Jamie Lopez's lymphadenopathy, given that it is tender, will trial antibiotics.  Courage close follow-up with primary care regarding Jamie Lopez  cervical lymphadenopathy.  Jamie Lopez and mother given strict return precautions.  Jamie Lopez is ready and stable for discharge.  Final Clinical Impressions(s) / ED Diagnoses   Final diagnoses:  None    ED Discharge Orders    None       Rueben Bash 06/01/18 2151    Donnetta Hutching, MD 06/02/18 [id number]

## 2018-06-01 NOTE — ED Notes (Signed)
Per Dr Adriana Simas patient does not need repeat diagnostic testing-will assess once in room

## 2018-06-01 NOTE — ED Notes (Signed)
Bed: WTR7 Expected date:  Expected time:  Means of arrival:  Comments: 

## 2018-06-01 NOTE — Discharge Instructions (Signed)
Take naproxen as needed for chest wall pain.  Augmentin as prescribed.  Follow-up with your primary care provider in 2 days for continued evaluation.  Return to the ED immediately for new or worsening symptoms, such as shortness of breath, increased pain, fevers, vomiting or any concerns at all.

## 2018-06-04 ENCOUNTER — Other Ambulatory Visit: Payer: Self-pay

## 2018-06-04 ENCOUNTER — Ambulatory Visit: Payer: BLUE CROSS/BLUE SHIELD | Admitting: Family Medicine

## 2018-06-04 ENCOUNTER — Encounter: Payer: Self-pay | Admitting: Family Medicine

## 2018-06-04 NOTE — Patient Instructions (Signed)
° ° ° °  If you have lab work done today you will be contacted with your lab results within the next 2 weeks.  If you have not heard from us then please contact us. The fastest way to get your results is to register for My Chart. ° ° °IF you received an x-ray today, you will receive an invoice from [facility]. Please contact [facility] at [phone] with questions or concerns regarding your invoice.  ° °IF you received labwork today, you will receive an invoice from [facility]. Please contact [facility] at [phone] with questions or concerns regarding your invoice.  ° °Our billing staff will not be able to assist you with questions regarding bills from these companies. ° °You will be contacted with the lab results as soon as they are available. The fastest way to get your results is to activate your My Chart account. Instructions are located on the last page of this paperwork. If you have not heard from us regarding the results in 2 weeks, please contact this office. °  ° ° ° °

## 2018-06-06 ENCOUNTER — Encounter: Payer: Self-pay | Admitting: Physician Assistant

## 2018-06-06 ENCOUNTER — Ambulatory Visit (INDEPENDENT_AMBULATORY_CARE_PROVIDER_SITE_OTHER): Payer: BLUE CROSS/BLUE SHIELD | Admitting: Physician Assistant

## 2018-06-06 ENCOUNTER — Other Ambulatory Visit: Payer: Self-pay

## 2018-06-06 VITALS — BP 113/77 | HR 97 | Temp 99.1°F | Ht 63.0 in | Wt 180.6 lb

## 2018-06-06 DIAGNOSIS — R591 Generalized enlarged lymph nodes: Secondary | ICD-10-CM | POA: Diagnosis not present

## 2018-06-06 DIAGNOSIS — B373 Candidiasis of vulva and vagina: Secondary | ICD-10-CM | POA: Diagnosis not present

## 2018-06-06 DIAGNOSIS — B3731 Acute candidiasis of vulva and vagina: Secondary | ICD-10-CM

## 2018-06-06 LAB — POCT CBC
Granulocyte percent: 37.7 %G (ref 37–80)
HCT, POC: 33.2 % (ref 29–41)
Hemoglobin: 11.7 g/dL (ref 11–14.6)
Lymph, poc: 2.3 (ref 0.6–3.4)
MCH, POC: 30.3 pg (ref 27–31.2)
MCHC: 35.2 g/dL (ref 31.8–35.4)
MCV: 86.2 fL (ref 76–111)
MID (cbc): 0.4 (ref 0–0.9)
MPV: 6.6 fL (ref 0–99.8)
POC Granulocyte: 1.7 — AB (ref 2–6.9)
POC LYMPH PERCENT: 52.7 %L — AB (ref 10–50)
POC MID %: 9.6 %M (ref 0–12)
Platelet Count, POC: 334 10*3/uL (ref 142–424)
RBC: 3.85 M/uL — AB (ref 4.04–5.48)
RDW, POC: 12.7 %
WBC: 4.4 10*3/uL — AB (ref 4.6–10.2)

## 2018-06-06 MED ORDER — FLUCONAZOLE 150 MG PO TABS: 150.0000 mg | ORAL_TABLET | Freq: Once | ORAL | 0 refills | Status: AC

## 2018-06-06 NOTE — Progress Notes (Signed)
Jamie Lopez  MRN: [id number] DOB: [birth year]  PCP: Forrest Moron, MD  Subjective:  Pt is a 23 year old female who presents to clinic for swollen lymph nodes. She first noticed what she thought was a bug bite on the right side of the back of her neck about 2 weeks ago. Since that time she has noticed 2 other lumps along her lymph nodes.   she is currently taking Augmentin  Pt.  has a past medical history of Anxiety, Asthma, Headache(784.0), Rosacea, and Seasonal allergies.  Review of Systems  Constitutional: Negative for chills and fever.  HENT: Negative for congestion, postnasal drip, rhinorrhea, sinus pressure, sinus pain, sneezing, sore throat and trouble swallowing.   Musculoskeletal: Negative for neck pain and neck stiffness.  Skin: Negative.     Patient Active Problem List   Diagnosis Date Noted  . GAD (generalized anxiety disorder) 10/25/2012  . Migraines 10/25/2012    Current Outpatient Medications on File Prior to Visit  Medication Sig Dispense Refill  . albuterol (PROVENTIL HFA;VENTOLIN HFA) 108 (90 Base) MCG/ACT inhaler Inhale 1-2 puffs into the lungs every 6 (six) hours as needed for wheezing or shortness of breath. 1 Inhaler 0  . amoxicillin-clavulanate (AUGMENTIN) 875-125 MG tablet Take 1 tablet by mouth every 12 (twelve) hours. 14 tablet 0  . budesonide (RHINOCORT ALLERGY) 32 MCG/ACT nasal spray Place 1 spray into both nostrils daily.    . cetirizine (ZYRTEC) 10 MG tablet Take 10 mg by mouth daily.    . naproxen (NAPROSYN) 500 MG tablet Take 1 tablet (500 mg total) by mouth 2 (two) times daily. 30 tablet 0  . Olopatadine HCl (PATADAY) 0.2 % SOLN Apply to eye.    Marland Kitchen PRESCRIPTION MEDICATION     . propranolol (INDERAL) 10 MG tablet Take 1 tablet (10 mg total) by mouth 2 (two) times daily as needed. 60 tablet 2  . rizatriptan (MAXALT-MLT) 10 MG disintegrating tablet Take 10 mg by mouth as needed for migraine. May repeat in 2 hours if needed    . sertraline  (ZOLOFT) 100 MG tablet Take 1 tablet (100 mg total) by mouth daily. 30 tablet 3   No current facility-administered medications on file prior to visit.     Allergies  Allergen Reactions  . Fruit & Vegetable Daily [Nutritional Supplements]   . Peanut-Containing Drug Products     Tree nut specifically      Objective:  BP 113/77 (BP Location: Right Arm, Patient Position: Sitting, Cuff Size: Normal)   Pulse 97   Temp 99.1 F (37.3 C) (Oral)   Ht 5' 3" (1.6 m)   Wt 180 lb 9.6 oz (81.9 kg)   LMP 05/14/2018 (Approximate)   SpO2 97%   BMI 31.99 kg/m   Physical Exam Vitals signs reviewed.  Constitutional:      Appearance: Normal appearance.  Lymphadenopathy:     Head:     Right side of head: No submental, submandibular, tonsillar, preauricular, posterior auricular or occipital adenopathy.     Left side of head: Posterior auricular (1cm firm flat nodule) adenopathy present. No submental, submandibular, tonsillar, preauricular or occipital adenopathy.     Cervical: Cervical adenopathy present.     Left cervical: Superficial cervical adenopathy and posterior cervical adenopathy present. No deep cervical adenopathy.     Comments:    Neurological:     Mental Status: She is alert.  Psychiatric:        Mood and Affect: Mood normal.  Behavior: Behavior normal.        Thought Content: Thought content normal.     Assessment and Plan :  1. Lymphadenopathy of head and neck - pt presents c/o lumps on neck. No other symptoms.  WBC count is 4.4, lymphs are 52.7. Firm nodules palpated on neck. Cannot r/o Hodgkin's lnmphoma. Plan to check other labs as well and refer for ultrasound of neck. She will f/u with her PCP after [place].   - POCT CBC - CBC with Differential/Platelet - Pathologist smear review - CMP14+EGFR - Uric Acid - HIV Antibody (routine testing w rflx) - Hepatitis C antibody - Hepatitis B surface antibody,quantitative - Microalbumin/Creatinine Ratio, Urine - Lactate  Dehydrogenase - Chlamydia/Gonococcus/Trichomonas, NAA - US Soft Tissue Head/Neck; Future  2. Vaginal yeast infection - fluconazole (DIFLUCAN) 150 MG tablet; Take 1 tablet (150 mg total) by mouth once for 1 dose. Repeat if needed  Dispense: 2 tablet; Refill: 0   Whitney Duy Lemming, PA-C  [facility] [place] 06/06/2018 1:34 PM  Please note: Portions of this report may have been transcribed using dragon voice recognition software. Every effort was made to ensure accuracy; however, inadvertent computerized transcription errors may be present.

## 2018-06-06 NOTE — Patient Instructions (Addendum)
You will receive a phone call to schedule an appointment for an ultrasound of your neck.  Please make an appointment with your PCP, Dr. Nolon Rod, in 2 weeks to discuss your results and if further imaging is needed (I recently [place] and live out of the state, so you will need to follow-up with PCP)  If you have lab work done today you will be contacted with your lab results within the next 2 weeks.  If you have not heard from [place] then please contact us. The fastest way to get your results is to register for My Chart.  Thank you for coming in today. I hope you feel we met your needs.  Feel free to call PCP if you have any questions or further requests.  Please consider signing up for MyChart if you do not already have it, as this is a great way to communicate with me.  Best,  Whitney McVey, PA-C  IF you received an x-ray today, you will receive an invoice from [facility]. Please contact [facility] at [phone] with questions or concerns regarding your invoice.   IF you received labwork today, you will receive an invoice from [facility]. Please contact [facility] at [phone] with questions or concerns regarding your invoice.   Our billing staff will not be able to assist you with questions regarding bills from these companies.  You will be contacted with the lab results as soon as they are available. The fastest way to get your results is to activate your My Chart account. Instructions are located on the last page of this paperwork. If you have not heard from [place] regarding the results in 2 weeks, please contact this office.

## 2018-06-07 LAB — CBC WITH DIFFERENTIAL/PLATELET
Basophils Absolute: 0 10*3/uL (ref 0.0–0.2)
Basos: 1 %
EOS (ABSOLUTE): 0 10*3/uL (ref 0.0–0.4)
Eos: 1 %
Hematocrit: 34 % (ref 34.0–46.6)
Hemoglobin: 11.5 g/dL (ref 11.1–15.9)
Immature Grans (Abs): 0 10*3/uL (ref 0.0–0.1)
Immature Granulocytes: 0 %
Lymphocytes Absolute: 2 10*3/uL (ref 0.7–3.1)
Lymphs: 50 %
MCH: 29.3 pg (ref 26.6–33.0)
MCHC: 33.8 g/dL (ref 31.5–35.7)
MCV: 87 fL (ref 79–97)
Monocytes Absolute: 0.5 10*3/uL (ref 0.1–0.9)
Monocytes: 13 %
Neutrophils Absolute: 1.4 10*3/uL (ref 1.4–7.0)
Neutrophils: 35 %
Platelets: 332 10*3/uL (ref 150–450)
RBC: 3.93 x10E6/uL (ref 3.77–5.28)
RDW: 12.5 % (ref 11.7–15.4)
WBC: 4 10*3/uL (ref 3.4–10.8)

## 2018-06-07 LAB — MICROALBUMIN / CREATININE URINE RATIO
Creatinine, Urine: 314.6 mg/dL
Microalb/Creat Ratio: 8.2 mg/g creat (ref 0.0–30.0)
Microalbumin, Urine: 25.8 ug/mL

## 2018-06-07 LAB — CMP14+EGFR
ALT: 31 IU/L (ref 0–32)
AST: 29 IU/L (ref 0–40)
Albumin/Globulin Ratio: 0.9 — ABNORMAL LOW (ref 1.2–2.2)
Albumin: 3.7 g/dL (ref 3.5–5.5)
Alkaline Phosphatase: 73 IU/L (ref 39–117)
BUN/Creatinine Ratio: 13 (ref 9–23)
BUN: 9 mg/dL (ref 6–20)
Bilirubin Total: 0.2 mg/dL (ref 0.0–1.2)
CO2: 24 mmol/L (ref 20–29)
Calcium: 8.7 mg/dL (ref 8.7–10.2)
Chloride: 102 mmol/L (ref 96–106)
Creatinine, Ser: 0.7 mg/dL (ref 0.57–1.00)
GFR calc Af Amer: 141 mL/min/{1.73_m2} (ref >59)
GFR calc non Af Amer: 123 mL/min/{1.73_m2} (ref >59)
Globulin, Total: 3.9 g/dL (ref 1.5–4.5)
Glucose: 96 mg/dL (ref 65–99)
Potassium: 3.4 mmol/L — ABNORMAL LOW (ref 3.5–5.2)
Sodium: 139 mmol/L (ref 134–144)
Total Protein: 7.6 g/dL (ref 6.0–8.5)

## 2018-06-07 LAB — URIC ACID: Uric Acid: 3.5 mg/dL (ref 2.5–7.1)

## 2018-06-07 LAB — HIV ANTIBODY (ROUTINE TESTING W REFLEX): HIV Screen 4th Generation wRfx: NONREACTIVE

## 2018-06-07 LAB — LACTATE DEHYDROGENASE: LDH: 194 IU/L (ref 119–226)

## 2018-06-07 LAB — HEPATITIS C ANTIBODY: Hep C Virus Ab: <0.1 s/co ratio (ref 0.0–0.9)

## 2018-06-07 LAB — HEPATITIS B SURFACE ANTIBODY, QUANTITATIVE: Hepatitis B Surf Ab Quant: 6 m[IU]/mL — ABNORMAL LOW (ref >9.9)

## 2018-06-08 LAB — CHLAMYDIA/GONOCOCCUS/TRICHOMONAS, NAA
Chlamydia by NAA: NEGATIVE
Gonococcus by NAA: NEGATIVE
Trich vag by NAA: NEGATIVE

## 2018-06-12 LAB — PATHOLOGIST SMEAR REVIEW
Basophils Absolute: 0 10*3/uL (ref 0.0–0.2)
Basos: 0 %
EOS (ABSOLUTE): 0 10*3/uL (ref 0.0–0.4)
Eos: 1 %
Hematocrit: 33.8 % — ABNORMAL LOW (ref 34.0–46.6)
Hemoglobin: 11.6 g/dL (ref 11.1–15.9)
Immature Grans (Abs): 0 10*3/uL (ref 0.0–0.1)
Immature Granulocytes: 0 %
Lymphocytes Absolute: 1.9 10*3/uL (ref 0.7–3.1)
Lymphs: 52 %
MCH: 29.7 pg (ref 26.6–33.0)
MCHC: 34.3 g/dL (ref 31.5–35.7)
MCV: 87 fL (ref 79–97)
Monocytes Absolute: 0.4 10*3/uL (ref 0.1–0.9)
Monocytes: 10 %
Neutrophils Absolute: 1.3 10*3/uL — ABNORMAL LOW (ref 1.4–7.0)
Neutrophils: 37 %
Path Rev PLTs: NORMAL
Platelets: 340 10*3/uL (ref 150–450)
RBC: 3.9 x10E6/uL (ref 3.77–5.28)
RDW: 12.5 % (ref 11.7–15.4)
WBC: 3.6 10*3/uL (ref 3.4–10.8)

## 2018-08-25 ENCOUNTER — Other Ambulatory Visit: Payer: Self-pay

## 2018-08-25 ENCOUNTER — Telehealth (INDEPENDENT_AMBULATORY_CARE_PROVIDER_SITE_OTHER): Payer: BLUE CROSS/BLUE SHIELD | Admitting: Family Medicine

## 2018-08-25 DIAGNOSIS — R590 Localized enlarged lymph nodes: Secondary | ICD-10-CM

## 2018-08-25 DIAGNOSIS — J309 Allergic rhinitis, unspecified: Secondary | ICD-10-CM

## 2018-08-25 MED ORDER — CETIRIZINE HCL 10 MG PO TABS: 10.0000 mg | ORAL_TABLET | Freq: Every day | ORAL | 3 refills | Status: DC

## 2018-08-25 MED ORDER — BUDESONIDE 32 MCG/ACT NA SUSP: 1.0000 | Freq: Every day | NASAL | 6 refills | Status: AC

## 2018-08-25 MED ORDER — OLOPATADINE HCL 0.2 % OP SOLN: 1.0000 [drp] | Freq: Every day | OPHTHALMIC | 3 refills | Status: DC | PRN

## 2018-08-25 NOTE — Progress Notes (Signed)
Virtual Visit via Video Note  I connected with Jamie Lopez on 08/25/18 at 11:20 AM EDT by a video enabled telemedicine application and verified that I am speaking with the correct person using two identifiers.   I discussed the limitations of evaluation and management by telemedicine and the availability of in person appointments. The patient expressed understanding and agreed to proceed.  CC: allergies   History of Present Illness:  Allergic rhinitis: Has been using some cetirizine. Min relief. Flair of allergy symptoms. Itching eyes. Sneezing. No fever, no dyspnea.  No recent asthma sx's - EIA last year. Has inhaler if needed.  Has used pataday - some help in past, but not full relief.  Wears contact lenses.     Neck lymphadenopathy: See office visit in January.  Note was reviewed.  Reportedly had what was thought to be a bug bite on the right side of her neck 2 weeks prior, was taking Augmentin at that time.  Noted to have posterior auricular lymphadenopathy approximately 1 cm noted on exam.  Also some superficial cervical lymphadenopathy and posterior cervical lymphadenopathy.  WBC was 4.4 with lymphs at 52.7.  She is referred for ultrasound of neck, but that was not performed.  Pathologist review, absolute granulocytosis, low normal hemoglobin hematocrit with low normal RBC, etiology not apparent from smear review.  Borderline low potassium at 3.4, other wise electrolytes looked okay.  Uric acid was normal.   Eventually bumps did go away - resolved after 2 weeks. . Warm compresses helped symptoms.  No fevers, night sweats, wt loss or other new bumps or swelling.  Feels well otherwise    Past Medical History:  Diagnosis Date  . Anxiety   . Asthma   . Headache(784.0)   . Rosacea   . Seasonal allergies    Past Surgical History:  Procedure Laterality Date  . NO PAST SURGERIES     Allergies  Allergen Reactions  . Fruit & Vegetable Daily [Nutritional Supplements]   .  Peanut-Containing Drug Products     Tree nut specifically    Prior to Admission medications   Medication Sig Start Date End Date Taking? Authorizing Provider  albuterol (PROVENTIL HFA;VENTOLIN HFA) 108 (90 Base) MCG/ACT inhaler Inhale 1-2 puffs into the lungs every 6 (six) hours as needed for wheezing or shortness of breath. 09/29/17  Yes Law, Alexandra M, PA-C  budesonide (RHINOCORT ALLERGY) 32 MCG/ACT nasal spray Place 1 spray into both nostrils daily.   Yes [provider]  cetirizine (ZYRTEC) 10 MG tablet Take 10 mg by mouth daily.   Yes [provider]  naproxen (NAPROSYN) 500 MG tablet Take 1 tablet (500 mg total) by mouth 2 (two) times daily. 06/01/18  Yes Kendrick, Caitlyn S, PA-C  Olopatadine HCl (PATADAY) 0.2 % SOLN Apply to eye.   Yes [provider]  PRESCRIPTION MEDICATION    Yes [provider]  propranolol (INDERAL) 10 MG tablet Take 1 tablet (10 mg total) by mouth 2 (two) times daily as needed. 03/15/18  Yes Oletta Darter, MD  rizatriptan (MAXALT-MLT) 10 MG disintegrating tablet Take 10 mg by mouth as needed for migraine. May repeat in 2 hours if needed   Yes [provider]  sertraline (ZOLOFT) 100 MG tablet Take 1 tablet (100 mg total) by mouth daily. 03/15/18  Yes Oletta Darter, MD   Social History   Socioeconomic History  . Marital status: Single    Spouse name: Not on file  . Number of children: 0  .  Years of education: 10  . Highest education level: Not on file  Occupational History  . Occupation: student    Comment: [employer]  Social Needs  . Financial resource strain: Not on file  . Food insecurity:    Worry: Not on file    Inability: Not on file  . Transportation needs:    Medical: Not on file    Non-medical: Not on file  Tobacco Use  . Smoking status: Former Smoker    Types: Cigarettes    Last attempt to quit: 05/11/2013    Years since quitting: 5.2  . Smokeless tobacco: Never Used  . Tobacco comment: Once  month.  Substance and Sexual Activity  . Alcohol use: Yes    Comment: social  . Drug use: Not Currently    Types: Marijuana    Comment: quit in 2018  . Sexual activity: Yes    Partners: Male    Birth control/protection: Condom  Lifestyle  . Physical activity:    Days per week: Not on file    Minutes per session: Not on file  . Stress: Not on file  Relationships  . Social connections:    Talks on phone: Not on file    Gets together: Not on file    Attends religious service: Not on file    Active member of club or organization: Not on file    Attends meetings of clubs or organizations: Not on file    Relationship status: Not on file  . Intimate partner violence:    Fear of current or ex partner: Not on file    Emotionally abused: Not on file    Physically abused: Not on file    Forced sexual activity: Not on file  Other Topics Concern  . Not on file  Social History Narrative   Pt lives in [place] with mom and brother. Pt goes to  [employer] and pt is a sophomore. Pt was born in [place] and raised by mom. Pt has 4 siblings and 1 on the way on her dad's side. Childhood was nice and she mostly raised as an child.  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       Observations/Objective:   Assessment and Plan: Allergic rhinitis, unspecified seasonality, unspecified trigger - Plan: budesonide (RHINOCORT ALLERGY) 32 MCG/ACT nasal spray, cetirizine (ZYRTEC) 10 MG tablet, Olopatadine HCl (PATADAY) 0.2 % SOLN  -Technique discussed for nasal spray, encouraged daily use of nasal spray, 1 to 2 sprays per nostril, addition of antihistamine then eyedrops if needed.  Trigger avoidance discussed, handout given, RTC precautions  Cervical lymphadenopathy  -Resolved.  Denies other concerning symptoms or red flags on history.  RTC precautions if recurrence of any swollen areas in neck or other new areas.  Follow Up Instructions:    I discussed the assessment and treatment plan with  the patient. The patient was provided an opportunity to ask questions and all were answered. The patient agreed with the plan and demonstrated an understanding of the instructions.   The patient was advised to call back or seek an in-person evaluation if the symptoms worsen or if the condition fails to improve as anticipated.  I provided 9 minutes of non-face-to-face time during this encounter.   Shade Flood, MD

## 2018-08-25 NOTE — Patient Instructions (Signed)
Try rhinocort nasal spray using the technique we discussed, zyrtec and pataday as needed.  Let me know if refill of albuterol needed.   If any return of swollen lymph nodes or lumps in the neck or other new swelling  - return for evaluation of those areas.  Additionally if you have any fevers, night sweats, weight loss or other new symptoms, please return for recheck.  Please let me know if there are any questions.  Allergic Rhinitis, Adult Allergic rhinitis is an allergic reaction that affects the mucous membrane inside the nose. It causes sneezing, a runny or stuffy nose, and the feeling of mucus going down the back of the throat (postnasal drip). Allergic rhinitis can be mild to severe. There are two types of allergic rhinitis:  Seasonal. This type is also called hay fever. It happens only during certain seasons.  Perennial. This type can happen at any time of the year. What are the causes? This condition happens when the body's defense system (immune system) responds to certain harmless substances called allergens as though they were germs.  Seasonal allergic rhinitis is triggered by pollen, which can come from grasses, trees, and weeds. Perennial allergic rhinitis may be caused by:  House dust mites.  Pet dander.  Mold spores. What are the signs or symptoms? Symptoms of this condition include:  Sneezing.  Runny or stuffy nose (nasal congestion).  Postnasal drip.  Itchy nose.  Tearing of the eyes.  Trouble sleeping.  Daytime sleepiness. How is this diagnosed? This condition may be diagnosed based on:  Your medical history.  A physical exam.  Tests to check for related conditions, such as: ? Asthma. ? Pink eye. ? Ear infection. ? Upper respiratory infection.  Tests to find out which allergens trigger your symptoms. These may include skin or blood tests. How is this treated? There is no cure for this condition, but treatment can help control symptoms. Treatment  may include:  Taking medicines that block allergy symptoms, such as antihistamines. Medicine may be given as a shot, nasal spray, or pill.  Avoiding the allergen.  Desensitization. This treatment involves getting ongoing shots until your body becomes less sensitive to the allergen. This treatment may be done if other treatments do not help.  If taking medicine and avoiding the allergen does not work, new, stronger medicines may be prescribed. Follow these instructions at home:  Find out what you are allergic to. Common allergens include smoke, dust, and pollen.  Avoid the things you are allergic to. These are some things you can do to help avoid allergens: ? Replace carpet with wood, tile, or vinyl flooring. Carpet can trap dander and dust. ? Do not smoke. Do not allow smoking in your home. ? Change your heating and air conditioning filter at least once a month. ? During allergy season:  Keep windows closed as much as possible.  Plan outdoor activities when pollen counts are lowest. This is usually during the evening hours.  When coming indoors, change clothing and shower before sitting on furniture or bedding.  Take over-the-counter and prescription medicines only as told by your health care provider.  Keep all follow-up visits as told by your health care provider. This is important. Contact a health care provider if:  You have a fever.  You develop a persistent cough.  You make whistling sounds when you breathe (you wheeze).  Your symptoms interfere with your normal daily activities. Get help right away if:  You have shortness of breath. Summary  This condition can be managed by taking medicines as directed and avoiding allergens.  Contact your health care provider if you develop a persistent cough or fever.  During allergy season, keep windows closed as much as possible. This information is not intended to replace advice given to you by your health care provider.  Make sure you discuss any questions you have with your health care provider. Document Released: 02/08/2001 Document Revised: 06/23/2016 Document Reviewed: 06/23/2016 Elsevier Interactive Patient Education  2019 [employer].

## 2018-11-09 ENCOUNTER — Encounter: Payer: Self-pay | Admitting: Family Medicine

## 2018-12-05 ENCOUNTER — Other Ambulatory Visit (HOSPITAL_COMMUNITY): Payer: Self-pay | Admitting: Psychiatry

## 2018-12-05 DIAGNOSIS — F411 Generalized anxiety disorder: Secondary | ICD-10-CM

## 2018-12-10 ENCOUNTER — Telehealth (HOSPITAL_COMMUNITY): Payer: Self-pay

## 2018-12-10 NOTE — Telephone Encounter (Signed)
Patient and patient's mom called requesting a refill on patient's Zoloft 100mg . Patient has scheduled appointment for 01/24/19. Please review and advise. Thank you.

## 2018-12-11 ENCOUNTER — Other Ambulatory Visit (HOSPITAL_COMMUNITY): Payer: Self-pay

## 2018-12-11 DIAGNOSIS — F411 Generalized anxiety disorder: Secondary | ICD-10-CM

## 2018-12-11 MED ORDER — SERTRALINE HCL 100 MG PO TABS: 100.0000 mg | ORAL_TABLET | Freq: Every day | ORAL | 0 refills | Status: DC

## 2018-12-11 NOTE — Telephone Encounter (Signed)
I sent in #[phone] refills to the pharmacy on patient's Zoloft 100mg . She was completely out of medication. Thank you.

## 2019-01-02 ENCOUNTER — Other Ambulatory Visit: Payer: Self-pay

## 2019-01-02 ENCOUNTER — Encounter: Payer: Self-pay | Admitting: Registered Nurse

## 2019-01-02 ENCOUNTER — Ambulatory Visit (INDEPENDENT_AMBULATORY_CARE_PROVIDER_SITE_OTHER): Payer: BLUE CROSS/BLUE SHIELD | Admitting: Registered Nurse

## 2019-01-02 ENCOUNTER — Other Ambulatory Visit (HOSPITAL_COMMUNITY)
Admission: RE | Admit: 2019-01-02 | Discharge: 2019-01-02 | Disposition: A | Discharge status: Home / Self Care | Payer: BC Managed Care – PPO | Source: Ambulatory Visit | Attending: Registered Nurse | Admitting: Registered Nurse

## 2019-01-02 VITALS — BP 111/75 | HR 71 | Temp 98.5°F | Resp 16 | Ht 62.99 in | Wt 175.0 lb

## 2019-01-02 DIAGNOSIS — L309 Dermatitis, unspecified: Secondary | ICD-10-CM

## 2019-01-02 DIAGNOSIS — Z130 Encounter for screening for diseases of the blood and blood-forming organs and certain disorders involving the immune mechanism: Secondary | ICD-10-CM

## 2019-01-02 DIAGNOSIS — Z111 Encounter for screening for respiratory tuberculosis: Secondary | ICD-10-CM

## 2019-01-02 DIAGNOSIS — Z1329 Encounter for screening for other suspected endocrine disorder: Secondary | ICD-10-CM

## 2019-01-02 DIAGNOSIS — Z113 Encounter for screening for infections with a predominantly sexual mode of transmission: Secondary | ICD-10-CM

## 2019-01-02 DIAGNOSIS — Z0001 Encounter for general adult medical examination with abnormal findings: Secondary | ICD-10-CM

## 2019-01-02 DIAGNOSIS — Z13228 Encounter for screening for other metabolic disorders: Secondary | ICD-10-CM

## 2019-01-02 DIAGNOSIS — Z13220 Encounter for screening for lipoid disorders: Secondary | ICD-10-CM

## 2019-01-02 DIAGNOSIS — Z0000 Encounter for general adult medical examination without abnormal findings: Secondary | ICD-10-CM

## 2019-01-02 MED ORDER — TRIAMCINOLONE ACETONIDE 0.1 % EX CREA: 1.0000 "application " | TOPICAL_CREAM | Freq: Two times a day (BID) | CUTANEOUS | 0 refills | Status: DC

## 2019-01-02 NOTE — Progress Notes (Signed)
Established Patient Office Visit  Subjective:  Patient ID: Jamie Lopez, female    DOB: 01/04/1996  Age: 23 y.o. MRN: [id number]  CC:  Chief Complaint  Patient presents with  . Annual Exam    HPI Jamie Lopez presents for CPE for school  States that she is starting at [employer] for graduate studies in interdisciplinary health science.   No urgent complaints.   Past Medical History:  Diagnosis Date  . Anxiety   . Asthma   . Headache(784.0)   . Rosacea   . Seasonal allergies     Past Surgical History:  Procedure Laterality Date  . NO PAST SURGERIES      Family History  Problem Relation Age of Onset  . Anxiety disorder Mother   . Diabetes Mellitus II Mother   . Depression Mother   . Anxiety disorder Maternal Grandfather   . Depression Maternal Grandfather   . Alcohol abuse Maternal Grandfather   . Drug abuse Maternal Grandfather   . Anxiety disorder Maternal Grandmother   . Diabetes Mellitus II Maternal Grandmother   . Depression Maternal Grandmother   . Alcohol abuse Paternal Grandfather   . Drug abuse Paternal Grandfather   . Bipolar disorder Cousin   . Schizophrenia Cousin   . Drug abuse Father     Social History   Socioeconomic History  . Marital status: Single    Spouse name: Not on file  . Number of children: 0  . Years of education: 8814  . Highest education level: Not on file  Occupational History  . Occupation: student    Comment: [employer]  Social Needs  . Financial resource strain: Not on file  . Food insecurity    Worry: Not on file    Inability: Not on file  . Transportation needs    Medical: Not on file    Non-medical: Not on file  Tobacco Use  . Smoking status: Former Smoker    Types: Cigarettes    Quit date: 05/11/2013    Years since quitting: 5.6  . Smokeless tobacco: Never Used  . Tobacco comment: Once month.  Substance and Sexual Activity  . Alcohol use: Yes    Comment: social  . Drug use: Not Currently    Types:  Marijuana    Comment: quit in 2018  . Sexual activity: Yes    Partners: Male    Birth control/protection: Condom  Lifestyle  . Physical activity    Days per week: Not on file    Minutes per session: Not on file  . Stress: Not on file  Relationships  . Social Musicianconnections    Talks on phone: Not on file    Gets together: Not on file    Attends religious service: Not on file    Active member of club or organization: Not on file    Attends meetings of clubs or organizations: Not on file    Relationship status: Not on file  . Intimate partner violence    Fear of current or ex partner: Not on file    Emotionally abused: Not on file    Physically abused: Not on file    Forced sexual activity: Not on file  Other Topics Concern  . Not on file  Social History Narrative   Pt lives in [place] with mom and brother. Pt goes to [employer] and pt is a sophomore. [place] born [place] and raised by mom. Pt has 4 siblings and 1 on the  way on her dad's side. Childhood was nice and she mostly raised as an child.  Pt works as an Engineer, manufacturing systemsundergraduate TA. Never married, no kids.       Outpatient Medications Prior to Visit  Medication Sig Dispense Refill  . albuterol (PROVENTIL HFA;VENTOLIN HFA) 108 (90 Base) MCG/ACT inhaler Inhale 1-2 puffs into the lungs every 6 (six) hours as needed for wheezing or shortness of breath. 1 Inhaler 0  . budesonide (RHINOCORT ALLERGY) 32 MCG/ACT nasal spray Place 1-2 sprays into both nostrils daily. 8.6 g 6  . cetirizine (ZYRTEC) 10 MG tablet Take 1 tablet (10 mg total) by mouth daily. 90 tablet 3  . naproxen (NAPROSYN) 500 MG tablet Take 1 tablet (500 mg total) by mouth 2 (two) times daily. 30 tablet 0  . Olopatadine HCl (PATADAY) 0.2 % SOLN Apply 1 drop to eye daily as needed. 2.5 mL 3  . PRESCRIPTION MEDICATION     . propranolol (INDERAL) 10 MG tablet Take 1 tablet (10 mg total) by mouth 2 (two) times daily as needed. 60 tablet 2  . rizatriptan (MAXALT-MLT) 10 MG  disintegrating tablet Take 10 mg by mouth as needed for migraine. May repeat in 2 hours if needed    . sertraline (ZOLOFT) 100 MG tablet Take 1 tablet (100 mg total) by mouth daily. 30 tablet 0   No facility-administered medications prior to visit.     Allergies  Allergen Reactions  . Fruit & Vegetable Daily [Nutritional Supplements]   . Peanut-Containing Drug Products     Tree nut specifically     ROS Review of Systems  Constitutional: Negative.   HENT: Negative.   Eyes: Negative.   Respiratory: Negative.   Cardiovascular: Negative.   Gastrointestinal: Negative.   Endocrine: Negative.   Genitourinary: Negative.   Musculoskeletal: Negative.   Skin: Negative.   Allergic/Immunologic: Negative.   Neurological: Negative.   Hematological: Negative.   Psychiatric/Behavioral: Negative.   All other systems reviewed and are negative.     Objective:    Physical Exam  Constitutional: She is oriented to person, place, and time. She appears well-developed and well-nourished. No distress.  HENT:  Head: Normocephalic and atraumatic.  Right Ear: External ear normal.  Left Ear: External ear normal.  Nose: Nose normal.  Mouth/Throat: Oropharynx is clear and moist. No oropharyngeal exudate.  Eyes: Pupils are equal, round, and reactive to light. Conjunctivae and EOM are normal. Right eye exhibits no discharge. Left eye exhibits no discharge. No scleral icterus.  Neck: Normal range of motion. Neck supple. No tracheal deviation present. No thyromegaly present.  Cardiovascular: Normal rate, regular rhythm, normal heart sounds and intact distal pulses. Exam reveals no gallop and no friction rub.  No murmur heard. Pulmonary/Chest: Effort normal and breath sounds normal. No respiratory distress. She has no wheezes. She has no rales. She exhibits no tenderness.  Abdominal: Soft. Bowel sounds are normal. She exhibits no distension and no mass. There is no abdominal tenderness. There is no  rebound and no guarding.  Musculoskeletal: Normal range of motion.        General: No tenderness, deformity or edema.  Lymphadenopathy:    She has no cervical adenopathy.  Neurological: She is alert and oriented to person, place, and time. No cranial nerve deficit. Coordination normal.  Skin: Skin is warm and dry. No rash noted. She is not diaphoretic. No erythema. No pallor.  Psychiatric: She has a normal mood and affect. Her behavior is normal. Judgment and thought content normal.  Nursing note and vitals reviewed.   BP 111/75   Pulse 71   Temp 98.5 F (36.9 C) (Oral)   Resp 16   Ht 5' 2.99" (1.6 m)   Wt 175 lb (79.4 kg)   LMP 12/05/2018 (Approximate)   SpO2 100%   BMI 31.01 kg/m  Wt Readings from Last 3 Encounters:  01/02/19 175 lb (79.4 kg)  06/06/18 180 lb 9.6 oz (81.9 kg)  06/04/18 175 lb 3.2 oz (79.5 kg)     Health Maintenance Due  Topic Date Due  . CHLAMYDIA SCREENING  05/07/2018  . INFLUENZA VACCINE  12/29/2018    There are no preventive care reminders to display for this patient.  Lab Results  Component Value Date   TSH 0.722 06/19/2015   Lab Results  Component Value Date   WBC 3.6 06/06/2018   HGB 11.6 06/06/2018   HCT 33.8 (L) 06/06/2018   MCV 87 06/06/2018   PLT 340 06/06/2018   Lab Results  Component Value Date   NA 139 06/06/2018   K 3.4 (L) 06/06/2018   CO2 24 06/06/2018   GLUCOSE 96 06/06/2018   BUN 9 06/06/2018   CREATININE 0.70 06/06/2018   BILITOT 0.2 06/06/2018   ALKPHOS 73 06/06/2018   AST 29 06/06/2018   ALT 31 06/06/2018   PROT 7.6 06/06/2018   ALBUMIN 3.7 06/06/2018   CALCIUM 8.7 06/06/2018   ANIONGAP 8 05/31/2018   No results found for: CHOL No results found for: HDL No results found for: LDLCALC No results found for: TRIG No results found for: CHOLHDL Lab Results  Component Value Date   HGBA1C 5.5 06/11/2017      Assessment & Plan:   Problem List Items Addressed This Visit    None    Visit Diagnoses     Screen for STD (sexually transmitted disease)    -  Primary   Relevant Orders   HIV antibody (with reflex)   RPR   GC/Chlamydia probe amp ([place])not at [facility]   Tuberculosis screening       Relevant Orders   QuantiFERON-TB Gold Plus   Lipid screening       Relevant Orders   Lipid panel   Screening for endocrine, metabolic and immunity disorder       Relevant Orders   CBC with Differential/Platelet   Comprehensive metabolic panel   Hemoglobin A1c   TSH   Routine general medical examination at a health care facility          No orders of the defined types were placed in this encounter.   Follow-up: Return in 1 year (on 01/02/2020).   PLAN  Physical exam with normal findings  Labs drawn will follow as warranted  Otherwise, return in 1 year for CPE  Patient encouraged to call clinic with any questions, comments, or concerns.   Maximiano Coss, NP

## 2019-01-02 NOTE — Patient Instructions (Addendum)
   If you have lab work done today you will be contacted with your lab results within the next 2 weeks.  If you have not heard from us then please contact us. The fastest way to get your results is to register for My Chart.   IF you received an x-ray today, you will receive an invoice from [facility]. Please contact [facility] at [phone] with questions or concerns regarding your invoice.   IF you received labwork today, you will receive an invoice from [facility]. Please contact [facility] at [phone] with questions or concerns regarding your invoice.   Our billing staff will not be able to assist you with questions regarding bills from these companies.  You will be contacted with the lab results as soon as they are available. The fastest way to get your results is to activate your My Chart account. Instructions are located on the last page of this paperwork. If you have not heard from us regarding the results in 2 weeks, please contact this office.       Health Maintenance, Female Adopting a healthy lifestyle and getting preventive care are important in promoting health and wellness. Ask your health care provider about:  The right schedule for you to have regular tests and exams.  Things you can do on your own to prevent diseases and keep yourself healthy. What should I know about diet, weight, and exercise? Eat a healthy diet   Eat a diet that includes plenty of vegetables, fruits, low-fat dairy products, and lean protein.  Do not eat a lot of foods that are high in solid fats, added sugars, or sodium. Maintain a healthy weight Body mass index (BMI) is used to identify weight problems. It estimates body fat based on height and weight. Your health care provider can help determine your BMI and help you achieve or maintain a healthy weight. Get regular exercise Get regular exercise. This is one of the most important things you can do for your health. Most  adults should:  Exercise for at least 150 minutes each week. The exercise should increase your heart rate and make you sweat (moderate-intensity exercise).  Do strengthening exercises at least twice a week. This is in addition to the moderate-intensity exercise.  Spend less time sitting. Even light physical activity can be beneficial. Watch cholesterol and blood lipids Have your blood tested for lipids and cholesterol at 24 years of age, then have this test every 5 years. Have your cholesterol levels checked more often if:  Your lipid or cholesterol levels are high.  You are older than 24 years of age.  You are at high risk for heart disease. What should I know about cancer screening? Depending on your health history and family history, you may need to have cancer screening at various ages. This may include screening for:  Breast cancer.  Cervical cancer.  Colorectal cancer.  Skin cancer.  Lung cancer. What should I know about heart disease, diabetes, and high blood pressure? Blood pressure and heart disease  High blood pressure causes heart disease and increases the risk of stroke. This is more likely to develop in people who have high blood pressure readings, are of African descent, or are overweight.  Have your blood pressure checked: ? Every 3-5 years if you are 18-39 years of age. ? Every year if you are 40 years old or older. Diabetes Have regular diabetes screenings. This checks your fasting blood sugar level. Have the screening done:  Once   every three years after age 40 if you are at a normal weight and have a low risk for diabetes.  More often and at a younger age if you are overweight or have a high risk for diabetes. What should I know about preventing infection? Hepatitis B If you have a higher risk for hepatitis B, you should be screened for this virus. Talk with your health care provider to find out if you are at risk for hepatitis B infection. Hepatitis  C Testing is recommended for:  Everyone born from 1945 through 1965.  Anyone with known risk factors for hepatitis C. Sexually transmitted infections (STIs)  Get screened for STIs, including gonorrhea and chlamydia, if: ? You are sexually active and are younger than 24 years of age. ? You are older than 24 years of age and your health care provider tells you that you are at risk for this type of infection. ? Your sexual activity has changed since you were last screened, and you are at increased risk for chlamydia or gonorrhea. Ask your health care provider if you are at risk.  Ask your health care provider about whether you are at high risk for HIV. Your health care provider may recommend a prescription medicine to help prevent HIV infection. If you choose to take medicine to prevent HIV, you should first get tested for HIV. You should then be tested every 3 months for as long as you are taking the medicine. Pregnancy  If you are about to stop having your period (premenopausal) and you may become pregnant, seek counseling before you get pregnant.  Take 400 to 800 micrograms (mcg) of folic acid every day if you become pregnant.  Ask for birth control (contraception) if you want to prevent pregnancy. Osteoporosis and menopause Osteoporosis is a disease in which the bones lose minerals and strength with aging. This can result in bone fractures. If you are 65 years old or older, or if you are at risk for osteoporosis and fractures, ask your health care provider if you should:  Be screened for bone loss.  Take a calcium or vitamin D supplement to lower your risk of fractures.  Be given hormone replacement therapy (HRT) to treat symptoms of menopause. Follow these instructions at home: Lifestyle  Do not use any products that contain nicotine or tobacco, such as cigarettes, e-cigarettes, and chewing tobacco. If you need help quitting, ask your health care provider.  Do not use street  drugs.  Do not share needles.  Ask your health care provider for help if you need support or information about quitting drugs. Alcohol use  Do not drink alcohol if: ? Your health care provider tells you not to drink. ? You are pregnant, may be pregnant, or are planning to become pregnant.  If you drink alcohol: ? Limit how much you use to 0-1 drink a day. ? Limit intake if you are breastfeeding.  Be aware of how much alcohol is in your drink. In the [place] one drink equals one 12 oz bottle of beer (355 mL), one 5 oz glass of wine (148 mL), or one 1 oz glass of hard liquor (44 mL). General instructions  Schedule regular health, dental, and eye exams.  Stay current with your vaccines.  Tell your health care provider if: ? You often feel depressed. ? You have ever been abused or do not feel safe at home. Summary  Adopting a healthy lifestyle and getting preventive care are important in promoting health and   wellness.  Follow your health care provider's instructions about healthy diet, exercising, and getting tested or screened for diseases.  Follow your health care provider's instructions on monitoring your cholesterol and blood pressure. This information is not intended to replace advice given to you by your health care provider. Make sure you discuss any questions you have with your health care provider. Document Released: 11/29/2010 Document Revised: 05/09/2018 Document Reviewed: 05/09/2018 Elsevier Patient Education  2020 [employer]vier Inc.     Why follow it? Research shows. . Those who follow the Mediterranean diet have a reduced risk of heart disease  . The diet is associated with a reduced incidence of Parkinson's and Alzheimer's diseases . People following the diet may have longer life expectancies and lower rates of chronic diseases  . The Dietary Guidelines for Americans recommends the Mediterranean diet as an eating plan to promote health and prevent disease  What Is the  Mediterranean Diet?  . Healthy eating plan based on typical foods and recipes of Mediterranean-style cooking . The diet is primarily a plant based diet; these foods should make up a majority of meals   Starches - Plant based foods should make up a majority of meals - They are an important sources of vitamins, minerals, energy, antioxidants, and fiber - Choose whole grains, foods high in fiber and minimally processed items  - Typical grain sources include wheat, oats, barley, corn, brown rice, bulgar, farro, millet, polenta, couscous  - Various types of beans include chickpeas, lentils, fava beans, black beans, white beans   Fruits  Veggies - Large quantities of antioxidant rich fruits & veggies; 6 or more servings  - Vegetables can be eaten raw or lightly drizzled with oil and cooked  - Vegetables common to the traditional Mediterranean Diet include: artichokes, arugula, beets, broccoli, brussel sprouts, cabbage, carrots, celery, collard greens, cucumbers, eggplant, kale, leeks, lemons, lettuce, mushrooms, okra, onions, peas, peppers, potatoes, pumpkin, radishes, rutabaga, shallots, spinach, sweet potatoes, turnips, zucchini - Fruits common to the Mediterranean Diet include: apples, apricots, avocados, cherries, clementines, dates, figs, grapefruits, grapes, melons, nectarines, oranges, peaches, pears, pomegranates, strawberries, tangerines  Fats - Replace butter and margarine with healthy oils, such as olive oil, canola oil, and tahini  - Limit nuts to no more than a handful a day  - Nuts include walnuts, almonds, pecans, pistachios, pine nuts  - Limit or avoid candied, honey roasted or heavily salted nuts - Olives are central to the Mediterranean diet - can be eaten whole or used in a variety of dishes   Meats Protein - Limiting red meat: no more than a few times a month - When eating red meat: choose lean cuts and keep the portion to the size of deck of cards - Eggs: approx. 0 to 4 times a  week  - Fish and lean poultry: at least 2 a week  - Healthy protein sources include, chicken, turkey, lean beef, lamb - Increase intake of seafood such as tuna, salmon, trout, mackerel, shrimp, scallops - Avoid or limit high fat processed meats such as sausage and bacon  Dairy - Include moderate amounts of low fat dairy products  - Focus on healthy dairy such as fat free yogurt, skim milk, low or reduced fat cheese - Limit dairy products higher in fat such as whole or 2% milk, cheese, ice cream  Alcohol - Moderate amounts of red wine is ok  - No more than 5 oz daily for women (all ages) and men older than age 65  -   No more than 10 oz of wine daily for men younger than 65  Other - Limit sweets and other desserts  - Use herbs and spices instead of salt to flavor foods  - Herbs and spices common to the traditional Mediterranean Diet include: basil, bay leaves, chives, cloves, cumin, fennel, garlic, lavender, marjoram, mint, oregano, parsley, pepper, rosemary, sage, savory, sumac, tarragon, thyme   It's not just a diet, it's a lifestyle:  . The Mediterranean diet includes lifestyle factors typical of those in the region  . Foods, drinks and meals are best eaten with others and savored . Daily physical activity is important for overall good health . This could be strenuous exercise like running and aerobics . This could also be more leisurely activities such as walking, housework, yard-work, or taking the stairs . Moderation is the key; a balanced and healthy diet accommodates most foods and drinks . Consider portion sizes and frequency of consumption of certain foods   Meal Ideas & Options:  . Breakfast:  o Whole wheat toast or whole wheat English muffins with peanut butter & hard boiled egg o Steel cut oats topped with apples & cinnamon and skim milk  o Fresh fruit: banana, strawberries, melon, berries, peaches  o Smoothies: strawberries, bananas, greek yogurt, peanut butter o Low fat  greek yogurt with blueberries and granola  o Egg white omelet with spinach and mushrooms o Breakfast couscous: whole wheat couscous, apricots, skim milk, cranberries  . Sandwiches:  o Hummus and grilled vegetables (peppers, zucchini, squash) on whole wheat bread   o Grilled chicken on whole wheat pita with lettuce, tomatoes, cucumbers or tzatziki  o Tuna salad on whole wheat bread: tuna salad made with greek yogurt, olives, red peppers, capers, green onions o Garlic rosemary lamb pita: lamb sauted with garlic, rosemary, salt & pepper; add lettuce, cucumber, greek yogurt to pita - flavor with lemon juice and black pepper  . Seafood:  o Mediterranean grilled salmon, seasoned with garlic, basil, parsley, lemon juice and black pepper o Shrimp, lemon, and spinach whole-grain pasta salad made with low fat greek yogurt  o Seared scallops with lemon orzo  o Seared tuna steaks seasoned salt, pepper, coriander topped with tomato mixture of olives, tomatoes, olive oil, minced garlic, parsley, green onions and cappers  . Meats:  o Herbed greek chicken salad with kalamata olives, cucumber, feta  o Red bell peppers stuffed with spinach, bulgur, lean ground beef (or lentils) & topped with feta   o Kebabs: skewers of chicken, tomatoes, onions, zucchini, squash  o Turkey burgers: made with red onions, mint, dill, lemon juice, feta cheese topped with roasted red peppers . Vegetarian o Cucumber salad: cucumbers, artichoke hearts, celery, red onion, feta cheese, tossed in olive oil & lemon juice  o Hummus and whole grain pita points with a greek salad (lettuce, tomato, feta, olives, cucumbers, red onion) o Lentil soup with celery, carrots made with vegetable broth, garlic, salt and pepper  o Tabouli salad: parsley, bulgur, mint, scallions, cucumbers, tomato, radishes, lemon juice, olive oil, salt and pepper.       Fat and Cholesterol Restricted Eating Plan Eating a diet that limits fat and cholesterol may  help lower your risk for heart disease and other conditions. Your body needs fat and cholesterol for basic functions, but eating too much of these things can be harmful to your health. Your health care provider may order lab tests to check your blood fat (lipid) and cholesterol levels. This helps your   health care provider understand your risk for certain conditions and whether you need to make diet changes. Work with your health care provider or dietitian to make an eating plan that is right for you. Your plan includes:  Limit your fat intake to ______% or less of your total calories a day.  Limit your saturated fat intake to ______% or less of your total calories a day.  Limit the amount of cholesterol in your diet to less than _________mg a day.  Eat ___________ g of fiber a day. What are tips for following this plan? General guidelines   If you are overweight, work with your health care provider to lose weight safely. Losing just 5-10% of your body weight can improve your overall health and help prevent diseases such as diabetes and heart disease.  Avoid: ? Foods with added sugar. ? Fried foods. ? Foods that contain partially hydrogenated oils, including stick margarine, some tub margarines, cookies, crackers, and other baked goods.  Limit alcohol intake to no more than 1 drink a day for nonpregnant women and 2 drinks a day for men. One drink equals 12 oz of beer, 5 oz of wine, or 1 oz of hard liquor. Reading food labels  Check food labels for: ? Trans fats, partially hydrogenated oils, or high amounts of saturated fat. Avoid foods that contain saturated fat and trans fat. ? The amount of cholesterol in each serving. Try to eat no more than 200 mg of cholesterol each day. ? The amount of fiber in each serving. Try to eat at least 20-30 g of fiber each day.  Choose foods with healthy fats, such as: ? Monounsaturated and polyunsaturated fats. These include olive and canola oil,  flaxseeds, walnuts, almonds, and seeds. ? Omega-3 fats. These are found in foods such as salmon, mackerel, sardines, tuna, flaxseed oil, and ground flaxseeds.  Choose grain products that have whole grains. Look for the word "whole" as the first word in the ingredient list. Cooking  Cook foods using methods other than frying. Baking, boiling, grilling, and broiling are some healthy options.  Eat more home-cooked food and less restaurant, buffet, and fast food.  Avoid cooking using saturated fats. ? Animal sources of saturated fats include meats, butter, and cream. ? Plant sources of saturated fats include palm oil, palm kernel oil, and coconut oil. Meal planning   At meals, imagine dividing your plate into fourths: ? Fill one-half of your plate with vegetables and green salads. ? Fill one-fourth of your plate with whole grains. ? Fill one-fourth of your plate with lean protein foods.  Eat fish that is high in omega-3 fats at least two times a week.  Eat more foods that contain fiber, such as whole grains, beans, apples, broccoli, carrots, peas, and barley. These foods help promote healthy cholesterol levels in the blood. Recommended foods Grains  Whole grains, such as whole wheat or whole grain breads, crackers, cereals, and pasta. Unsweetened oatmeal, bulgur, barley, quinoa, or brown rice. Corn or whole wheat flour tortillas. Vegetables  Fresh or frozen vegetables (raw, steamed, roasted, or grilled). Green salads. Fruits  All fresh, canned (in natural juice), or frozen fruits. Meats and other protein foods  Ground beef (85% or leaner), grass-fed beef, or beef trimmed of fat. Skinless chicken or turkey. Ground chicken or turkey. Pork trimmed of fat. All fish and seafood. Egg whites. Dried beans, peas, or lentils. Unsalted nuts or seeds. Unsalted canned beans. Natural nut butters without added sugar and oil. Dairy    Low-fat or nonfat dairy products, such as skim or 1% milk, 2% or  reduced-fat cheeses, low-fat and fat-free ricotta or cottage cheese, or plain low-fat and nonfat yogurt. Fats and oils  Tub margarine without trans fats. Light or reduced-fat mayonnaise and salad dressings. Avocado. Olive, canola, sesame, or safflower oils. The items listed above may not be a complete list of recommended foods or beverages. Contact your dietitian for more options. Foods to avoid Grains  White bread. White pasta. White rice. Cornbread. Bagels, pastries, and croissants. Crackers and snack foods that contain trans fat and hydrogenated oils. Vegetables  Vegetables cooked in cheese, cream, or butter sauce. Fried vegetables. Fruits  Canned fruit in heavy syrup. Fruit in cream or butter sauce. Fried fruit. Meats and other protein foods  Fatty cuts of meat. Ribs, chicken wings, bacon, sausage, bologna, salami, chitterlings, fatback, hot dogs, bratwurst, and packaged lunch meats. Liver and organ meats. Whole eggs and egg yolks. Chicken and turkey with skin. Fried meat. Dairy  Whole or 2% milk, cream, half-and-half, and cream cheese. Whole milk cheeses. Whole-fat or sweetened yogurt. Full-fat cheeses. Nondairy creamers and whipped toppings. Processed cheese, cheese spreads, and cheese curds. Beverages  Alcohol. Sugar-sweetened drinks such as sodas, lemonade, and fruit drinks. Fats and oils  Butter, stick margarine, lard, shortening, ghee, or bacon fat. Coconut, palm kernel, and palm oils. Sweets and desserts  Corn syrup, sugars, honey, and molasses. Candy. Jam and jelly. Syrup. Sweetened cereals. Cookies, pies, cakes, donuts, muffins, and ice cream. The items listed above may not be a complete list of foods and beverages to avoid. Contact your dietitian for more information. Summary  Your body needs fat and cholesterol for basic functions. However, eating too much of these things can be harmful to your health.  Work with your health care provider and dietitian to follow a  diet low in fat and cholesterol. Doing this may help lower your risk for heart disease and other conditions.  Choose healthy fats, such as monounsaturated and polyunsaturated fats, and foods high in omega-3 fatty acids.  Eat fiber-rich foods, such as whole grains, beans, peas, fruits, and vegetables.  Limit or avoid alcohol, fried foods, and foods high in saturated fats, partially hydrogenated oils, and sugar. This information is not intended to replace advice given to you by your health care provider. Make sure you discuss any questions you have with your health care provider. Document Released: 05/16/2005 Document Revised: 04/28/2017 Document Reviewed: 01/31/2017 Elsevier Patient Education  2020 [employer] Inc.  American Heart Association (AHA) Exercise Recommendation  Being physically active is important to prevent heart disease and stroke, the nation's No. 1and No. 5killers. To improve overall cardiovascular health, we suggest at least 150 minutes per week of moderate exercise or 75 minutes per week of vigorous exercise (or a combination of moderate and vigorous activity). Thirty minutes a day, five times a week is an easy goal to remember. You will also experience benefits even if you divide your time into two or three segments of 10 to 15 minutes per day.  For people who would benefit from lowering their blood pressure or cholesterol, we recommend 40 minutes of aerobic exercise of moderate to vigorous intensity three to four times a week to lower the risk for heart attack and stroke.  Physical activity is anything that makes you move your body and burn calories.  This includes things like climbing stairs or playing sports. Aerobic exercises benefit your heart, and include walking, jogging, swimming or biking. Strength and   stretching exercises are best for overall stamina and flexibility.  The simplest, positive change you can make to effectively improve your heart health is to start  walking. It's enjoyable, free, easy, social and great exercise. A walking program is flexible and boasts high success rates because people can stick with it. It's easy for walking to become a regular and satisfying part of life.   For Overall Cardiovascular Health:  At least 30 minutes of moderate-intensity aerobic activity at least 5 days per week for a total of 150  OR   At least 25 minutes of vigorous aerobic activity at least 3 days per week for a total of 75 minutes; or a combination of moderate- and vigorous-intensity aerobic activity  AND   Moderate- to high-intensity muscle-strengthening activity at least 2 days per week for additional health benefits.  For Lowering Blood Pressure and Cholesterol  An average 40 minutes of moderate- to vigorous-intensity aerobic activity 3 or 4 times per week  What if I can't make it to the time goal? Something is always better than nothing! And everyone has to start somewhere. Even if you've been sedentary for years, today is the day you can begin to make healthy changes in your life. If you don't think you'll make it for 30 or 40 minutes, set a reachable goal for today. You can work up toward your overall goal by increasing your time as you get stronger. Don't let all-or-nothing thinking rob you of doing what you can every day.  Source:http://www.heart.org    

## 2019-01-04 LAB — QUANTIFERON-TB GOLD PLUS
QuantiFERON Mitogen Value: 3.1 IU/mL
QuantiFERON Nil Value: 0.01 IU/mL
QuantiFERON TB1 Ag Value: 0.01 IU/mL
QuantiFERON TB2 Ag Value: 0.01 IU/mL
QuantiFERON-TB Gold Plus: NEGATIVE

## 2019-01-04 LAB — COMPREHENSIVE METABOLIC PANEL
ALT: 20 IU/L (ref 0–32)
AST: 22 IU/L (ref 0–40)
Albumin/Globulin Ratio: 1.1 — ABNORMAL LOW (ref 1.2–2.2)
Albumin: 3.9 g/dL (ref 3.9–5.0)
Alkaline Phosphatase: 76 IU/L (ref 39–117)
BUN/Creatinine Ratio: 15 (ref 9–23)
BUN: 9 mg/dL (ref 6–20)
Bilirubin Total: 0.4 mg/dL (ref 0.0–1.2)
CO2: 20 mmol/L (ref 20–29)
Calcium: 9.2 mg/dL (ref 8.7–10.2)
Chloride: 105 mmol/L (ref 96–106)
Creatinine, Ser: 0.62 mg/dL (ref 0.57–1.00)
GFR calc Af Amer: 147 mL/min/{1.73_m2} (ref >59)
GFR calc non Af Amer: 128 mL/min/{1.73_m2} (ref >59)
Globulin, Total: 3.5 g/dL (ref 1.5–4.5)
Glucose: 83 mg/dL (ref 65–99)
Potassium: 4.1 mmol/L (ref 3.5–5.2)
Sodium: 138 mmol/L (ref 134–144)
Total Protein: 7.4 g/dL (ref 6.0–8.5)

## 2019-01-04 LAB — CBC WITH DIFFERENTIAL/PLATELET
Basophils Absolute: 0 10*3/uL (ref 0.0–0.2)
Basos: 0 %
EOS (ABSOLUTE): 0 10*3/uL (ref 0.0–0.4)
Eos: 1 %
Hematocrit: 35.9 % (ref 34.0–46.6)
Hemoglobin: 12.2 g/dL (ref 11.1–15.9)
Immature Grans (Abs): 0 10*3/uL (ref 0.0–0.1)
Immature Granulocytes: 0 %
Lymphocytes Absolute: 1.5 10*3/uL (ref 0.7–3.1)
Lymphs: 42 %
MCH: 29.7 pg (ref 26.6–33.0)
MCHC: 34 g/dL (ref 31.5–35.7)
MCV: 87 fL (ref 79–97)
Monocytes Absolute: 0.4 10*3/uL (ref 0.1–0.9)
Monocytes: 12 %
Neutrophils Absolute: 1.6 10*3/uL (ref 1.4–7.0)
Neutrophils: 45 %
Platelets: 359 10*3/uL (ref 150–450)
RBC: 4.11 x10E6/uL (ref 3.77–5.28)
RDW: 13.1 % (ref 11.7–15.4)
WBC: 3.6 10*3/uL (ref 3.4–10.8)

## 2019-01-04 LAB — LIPID PANEL
Chol/HDL Ratio: 2.6 ratio (ref 0.0–4.4)
Cholesterol, Total: 151 mg/dL (ref 100–199)
HDL: 58 mg/dL (ref >39)
LDL Calculated: 83 mg/dL (ref 0–99)
Triglycerides: 52 mg/dL (ref 0–149)
VLDL Cholesterol Cal: 10 mg/dL (ref 5–40)

## 2019-01-04 LAB — HEMOGLOBIN A1C
Est. average glucose Bld gHb Est-mCnc: 108 mg/dL
Hgb A1c MFr Bld: 5.4 % (ref 4.8–5.6)

## 2019-01-04 LAB — GC/CHLAMYDIA PROBE AMP (~~LOC~~) NOT AT ARMC
Chlamydia: NEGATIVE
Neisseria Gonorrhea: NEGATIVE

## 2019-01-04 LAB — HIV ANTIBODY (ROUTINE TESTING W REFLEX): HIV Screen 4th Generation wRfx: NONREACTIVE

## 2019-01-04 LAB — TSH: TSH: 1.01 u[IU]/mL (ref 0.450–4.500)

## 2019-01-04 LAB — RPR: RPR Ser Ql: NONREACTIVE

## 2019-01-04 NOTE — Progress Notes (Signed)
Good afternoon Jamie Lopez -  Your lab results are back. They are all normal. The only flagged result is a mildly low albumin/globulin ratio, but given that the rest of your Metabolic Panel is normal, this is likely a benign finding that should not warrant any concern.  If you need a printed copy of these labs, please feel free to pick one up at the office.  Thank you,  Kathrin Ruddy, NP

## 2019-01-11 ENCOUNTER — Telehealth: Payer: Self-pay | Admitting: Family Medicine

## 2019-01-11 NOTE — Telephone Encounter (Signed)
Copied from [place] [phone]. Topic: General - Call Back - No Documentation >> Jan 11, 2019  2:57 PM Erick Blinks wrote: Reason for CRM: Pt checking status of forms needed for school. Please advise so pt can come retrieve documents Best contact: [phone]

## 2019-01-13 NOTE — Telephone Encounter (Signed)
Will check status of form completion and contact pt back at [phone].

## 2019-01-14 NOTE — Telephone Encounter (Signed)
Form given to stallings for completion.

## 2019-01-24 ENCOUNTER — Other Ambulatory Visit: Payer: Self-pay

## 2019-01-24 ENCOUNTER — Encounter (HOSPITAL_COMMUNITY): Payer: Self-pay | Admitting: Psychiatry

## 2019-01-24 ENCOUNTER — Ambulatory Visit (INDEPENDENT_AMBULATORY_CARE_PROVIDER_SITE_OTHER): Payer: BC Managed Care – PPO | Admitting: Psychiatry

## 2019-01-24 DIAGNOSIS — F411 Generalized anxiety disorder: Secondary | ICD-10-CM | POA: Diagnosis not present

## 2019-01-24 MED ORDER — PROPRANOLOL HCL 10 MG PO TABS: 10.0000 mg | ORAL_TABLET | Freq: Two times a day (BID) | ORAL | 0 refills | Status: DC

## 2019-01-24 MED ORDER — SERTRALINE HCL 100 MG PO TABS: 100.0000 mg | ORAL_TABLET | Freq: Every day | ORAL | 0 refills | Status: DC

## 2019-01-24 NOTE — Progress Notes (Signed)
Virtual Visit via Video Note  I connected with Jamie Lopez on 01/24/19 at  2:45 PM EDT by a video enabled telemedicine application and verified that I am speaking with the correct person using two identifiers.  Location: Patient: in car Provider: home   I discussed the limitations of evaluation and management by telemedicine and the availability of in person appointments. The patient expressed understanding and agreed to proceed.  History of Present Illness: I last saw this patient in October 2019. Pt is ending her job at [employer] tomorrow. 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 Damoni will move next year once COVID calms down. She has been keeping up with all the political, social and  pandemic events. It can be overwhelming. Pt reports her mood is stable. She has some days where she feels sad and defeated. On those days she isolates and lies in bed all day. It lasts for about a day and then she feels better. It happens about 2x/month. Pt denies SI/HI. Appetite is good. Sleep is "horrible". She is working 3rd shift and sleeps during the daytime. Tawanna took Melatonin last night and it helped her sleep quickly. Her anxiety has increased recently due to restarting school. Her classes are during the day and for 2 weeks she was working 3rd shift. She was anxious because she feels she is not effectively studying. Pt feels overwhelmed with good stress by school work. Her work stress is bad. She expects that all this will improve once she stops working at [employer]. 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 They will accommodate her work and class schedule. She ran out of Zoloft 1 month ago and felt like she went thru withdrawal. Pt states she had enough tabs from our last visit to take till last month. Pt states Zoloft and Propranolol help to reduce her anxiety to a more manageable level. She would like to continue both.    Observations/Objective:   There were no vitals  taken for this visit.There is no height or weight on file to calculate BMI.  General Appearance: Casual and Fairly Groomed  Eye Contact:  Good  Speech:  Clear and Coherent and Normal Rate  Volume:  Normal  Mood:  Anxious  Affect:  Full Range  Thought Process:  Goal Directed and Descriptions of Associations: Intact  Orientation:  Full (Time, Place, and Person)  Thought Content:  Logical  Suicidal Thoughts:  No  Homicidal Thoughts:  No  Memory:  Immediate;   Good  Judgement:  Good  Insight:  Good  Psychomotor Activity:  Normal  Concentration:  Concentration: Good and Attention Span: Good  Recall:  Good  Fund of Knowledge:  Good  Language:  Good  Akathisia:  No  Handed:  Right  AIMS (if indicated):     Assets:  Communication Skills Housing Leisure Time Physical Health Resilience Social Support Talents/Skills Transportation Vocational/Educational  ADL's:  Intact  Cognition:  WNL  Sleep:        Assessment and Plan:  GAD; Insomnia  I reviewed the Medical Records in Epic   Zoloft 100mg  po qD Propranolol 10mg  BID  I reiterated to the patient that she would not receive any refills unless she attended her appointments.  Patient verbalized understanding.  Follow Up Instructions: In 8-10 weeks or sooner if needed   I discussed the assessment and treatment plan with the patient. The patient was provided an opportunity to ask questions and all were answered. The  patient agreed with the plan and demonstrated an understanding of the instructions.   The patient was advised to call back or seek an in-person evaluation if the symptoms worsen or if the condition fails to improve as anticipated.  I provided 45 minutes of non-face-to-face time during this encounter.   Charlcie Cradle, MD

## 2019-03-28 ENCOUNTER — Other Ambulatory Visit: Payer: Self-pay

## 2019-03-28 ENCOUNTER — Ambulatory Visit (INDEPENDENT_AMBULATORY_CARE_PROVIDER_SITE_OTHER): Payer: BC Managed Care – PPO | Admitting: Psychiatry

## 2019-03-28 ENCOUNTER — Encounter (HOSPITAL_COMMUNITY): Payer: Self-pay | Admitting: Psychiatry

## 2019-03-28 DIAGNOSIS — F411 Generalized anxiety disorder: Secondary | ICD-10-CM | POA: Diagnosis not present

## 2019-03-28 MED ORDER — SERTRALINE HCL 100 MG PO TABS: 100.0000 mg | ORAL_TABLET | Freq: Every day | ORAL | 3 refills | Status: DC

## 2019-03-28 MED ORDER — PROPRANOLOL HCL 10 MG PO TABS: 10.0000 mg | ORAL_TABLET | Freq: Two times a day (BID) | ORAL | 3 refills | Status: DC

## 2019-03-28 NOTE — Progress Notes (Signed)
  Virtual Visit via Telephone Note  I connected with Jamie Lopez  on 03/28/19 at  2:30 PM EDT by telephone and verified that I am speaking with the correct person using two identifiers.  Location: Patient: home Provider: office   I discussed the limitations, risks, security and privacy concerns of performing an evaluation and management service by telephone and the availability of in person appointments. I also discussed with the patient that there may be a patient responsible charge related to this service. The patient expressed understanding and agreed to proceed.   History of Present Illness: Pt states she is doing well. Her anxiety is manageable. She is stressed by school but states it is of her own making. The Propranolol does help and she notes that when she takes it consistently her irritability is better. Sleep is good because she has a routine due to school and work. Pt has 3 job offers and is trying to find something that finds her schedule.    Observations/Objective: I spoke with Jamie Lopez on the phone.  Pt was calm, pleasant and cooperative.  Pt was engaged in the conversation and answered questions appropriately.  Speech was clear and coherent with normal rate, tone and volume.  Mood is euthymic and affect is full. Thought processes are coherent, goal oriented and intact.  Thought content is logical.  Pt denies SI/HI.   Pt denies auditory and visual hallucinations and did not appear to be responding to internal stimuli.  Memory and concentration are good.  Fund of knowledge and use of language are average.  Insight and judgment are fair.  I am unable to comment on psychomotor activity, general appearance, hygiene, or eye contact as I was unable to physically see the patient on the phone.  Vital signs not available since interview conducted virtually.     Assessment and Plan: GAD; Insomnia  Status of current symptoms: stable  Zoloft 100mg  po qD  Propranolol 10mg  po  BID    Follow Up Instructions: In 16 weeks or sooner if needed   I discussed the assessment and treatment plan with the patient. The patient was provided an opportunity to ask questions and all were answered. The patient agreed with the plan and demonstrated an understanding of the instructions.   The patient was advised to call back or seek an in-person evaluation if the symptoms worsen or if the condition fails to improve as anticipated.  I provided 15 minutes of non-face-to-face time during this encounter.   Charlcie Cradle, MD

## 2019-04-05 DIAGNOSIS — Z20828 Contact with and (suspected) exposure to other viral communicable diseases: Secondary | ICD-10-CM | POA: Diagnosis not present

## 2019-04-19 DIAGNOSIS — Z20828 Contact with and (suspected) exposure to other viral communicable diseases: Secondary | ICD-10-CM | POA: Diagnosis not present

## 2019-05-13 ENCOUNTER — Telehealth (HOSPITAL_COMMUNITY): Payer: Self-pay

## 2019-05-13 DIAGNOSIS — F411 Generalized anxiety disorder: Secondary | ICD-10-CM

## 2019-05-13 NOTE — Telephone Encounter (Signed)
Medication refill request - Fax from pt's [facility] requesting a 90 day refill order of her prescribed Sertraline. Last ordered 03/27/20 + 3 refills and pt returns 07/25/19.

## 2019-05-16 NOTE — Telephone Encounter (Signed)
90 days of Zoloft is fine

## 2019-05-17 MED ORDER — SERTRALINE HCL 100 MG PO TABS: 100.0000 mg | ORAL_TABLET | Freq: Every day | ORAL | 0 refills | Status: DC

## 2019-05-17 NOTE — Telephone Encounter (Signed)
A new 90 day order for patient's prescribed Sertraline 100 mg, one a day, #90 with no refills e-scribed to patient's [facility] on [address] as authorized by Dr. Doyne Keel.

## 2019-05-18 DIAGNOSIS — J019 Acute sinusitis, unspecified: Secondary | ICD-10-CM | POA: Diagnosis not present

## 2019-05-20 DIAGNOSIS — Z20828 Contact with and (suspected) exposure to other viral communicable diseases: Secondary | ICD-10-CM | POA: Diagnosis not present

## 2019-05-20 DIAGNOSIS — Z03818 Encounter for observation for suspected exposure to other biological agents ruled out: Secondary | ICD-10-CM | POA: Diagnosis not present

## 2019-06-03 DIAGNOSIS — Z20822 Contact with and (suspected) exposure to covid-19: Secondary | ICD-10-CM | POA: Diagnosis not present

## 2019-06-03 DIAGNOSIS — Z20828 Contact with and (suspected) exposure to other viral communicable diseases: Secondary | ICD-10-CM | POA: Diagnosis not present

## 2019-06-18 ENCOUNTER — Other Ambulatory Visit (HOSPITAL_COMMUNITY): Payer: Self-pay | Admitting: Psychiatry

## 2019-06-18 DIAGNOSIS — F411 Generalized anxiety disorder: Secondary | ICD-10-CM

## 2019-07-03 DIAGNOSIS — Z6835 Body mass index (BMI) 35.0-35.9, adult: Secondary | ICD-10-CM | POA: Diagnosis not present

## 2019-07-03 DIAGNOSIS — Z113 Encounter for screening for infections with a predominantly sexual mode of transmission: Secondary | ICD-10-CM | POA: Diagnosis not present

## 2019-07-03 DIAGNOSIS — Z118 Encounter for screening for other infectious and parasitic diseases: Secondary | ICD-10-CM | POA: Diagnosis not present

## 2019-07-03 DIAGNOSIS — Z01419 Encounter for gynecological examination (general) (routine) without abnormal findings: Secondary | ICD-10-CM | POA: Diagnosis not present

## 2019-07-05 ENCOUNTER — Other Ambulatory Visit (HOSPITAL_COMMUNITY): Payer: Self-pay | Admitting: Psychiatry

## 2019-07-05 DIAGNOSIS — F411 Generalized anxiety disorder: Secondary | ICD-10-CM

## 2019-07-22 DIAGNOSIS — Z20828 Contact with and (suspected) exposure to other viral communicable diseases: Secondary | ICD-10-CM | POA: Diagnosis not present

## 2019-07-22 DIAGNOSIS — Z20822 Contact with and (suspected) exposure to covid-19: Secondary | ICD-10-CM | POA: Diagnosis not present

## 2019-07-25 ENCOUNTER — Other Ambulatory Visit: Payer: Self-pay

## 2019-07-25 ENCOUNTER — Ambulatory Visit (INDEPENDENT_AMBULATORY_CARE_PROVIDER_SITE_OTHER): Payer: Federal, State, Local not specified - PPO | Admitting: Psychiatry

## 2019-07-25 ENCOUNTER — Encounter (HOSPITAL_COMMUNITY): Payer: Self-pay | Admitting: Psychiatry

## 2019-07-25 DIAGNOSIS — F411 Generalized anxiety disorder: Secondary | ICD-10-CM | POA: Diagnosis not present

## 2019-07-25 MED ORDER — PROPRANOLOL HCL 10 MG PO TABS: 10.0000 mg | ORAL_TABLET | Freq: Two times a day (BID) | ORAL | 0 refills | Status: DC

## 2019-07-25 MED ORDER — SERTRALINE HCL 100 MG PO TABS: 100.0000 mg | ORAL_TABLET | Freq: Every day | ORAL | 0 refills | Status: DC

## 2019-07-25 NOTE — Progress Notes (Signed)
Virtual Visit via Telephone Note  I connected with Jamie Lopez on 07/25/19 at  2:00 PM EST by telephone and verified that I am speaking with the correct person using two identifiers.  Location: Patient: home Provider: office   I discussed the limitations, risks, security and privacy concerns of performing an evaluation and management service by telephone and the availability of in person appointments. I also discussed with the patient that there may be a patient responsible charge related to this service. The patient expressed understanding and agreed to proceed.   History of Present Illness: Overall she is doing well. She has ongoing stress related to school and life in general. Sleep is good but she often finds she is clenching her jaw. She notes that it is worse when she anxious. Most days her anxiety is manageable and she is taking Propranolol.Tasmin is trying to use coping skills.  She denies depression. Trenese denies SI/HI. When really overwhelmed she will withdraw from everything for hours or even one day.    Observations/Objective: General Appearance: unable to assess  Eye Contact:  unable to assess  Speech:  Clear and Coherent and Normal Rate  Volume:  Normal  Mood:  Euthymic  Affect:  Full Range  Thought Process:  Goal Directed, Linear and Descriptions of Associations: Intact  Orientation:  Full (Time, Place, and Person)  Thought Content:  Logical  Suicidal Thoughts:  No  Homicidal Thoughts:  No  Memory:  Immediate;   Good  Judgement:  Good  Insight:  Good  Psychomotor Activity:  unable to assess  Concentration:  Concentration: Good  Recall:  Good  Fund of Knowledge:  Good  Language:  Good  Akathisia:  unable to assess  Handed:  Right  AIMS (if indicated):     Assets:  Communication Skills Desire for Improvement Financial Resources/Insurance Housing Leisure Time Physical Health Resilience Social  Support Talents/Skills Transportation Vocational/Educational  ADL's:  Unable to assess  Cognition:  WNL  Sleep:        Assessment and Plan: GAD; Insomnia  Zoloft 100mg  po qD Propranolol 10mg  po BID  Talked about using app such as calm or headspace to learn coping skills  Follow Up Instructions: In 3 months or sooner if needed   I discussed the assessment and treatment plan with the patient. The patient was provided an opportunity to ask questions and all were answered. The patient agreed with the plan and demonstrated an understanding of the instructions.   The patient was advised to call back or seek an in-person evaluation if the symptoms worsen or if the condition fails to improve as anticipated.  I provided 15 minutes of non-face-to-face time during this encounter.   , MD

## 2019-09-01 DIAGNOSIS — F331 Major depressive disorder, recurrent, moderate: Secondary | ICD-10-CM | POA: Diagnosis not present

## 2019-09-08 DIAGNOSIS — F331 Major depressive disorder, recurrent, moderate: Secondary | ICD-10-CM | POA: Diagnosis not present

## 2019-09-15 DIAGNOSIS — F331 Major depressive disorder, recurrent, moderate: Secondary | ICD-10-CM | POA: Diagnosis not present

## 2019-09-17 ENCOUNTER — Other Ambulatory Visit (HOSPITAL_COMMUNITY): Payer: Self-pay

## 2019-09-17 ENCOUNTER — Telehealth (HOSPITAL_COMMUNITY): Payer: Self-pay

## 2019-09-17 DIAGNOSIS — G4700 Insomnia, unspecified: Secondary | ICD-10-CM

## 2019-09-17 MED ORDER — HYDROXYZINE PAMOATE 25 MG PO CAPS: 25.0000 mg | ORAL_CAPSULE | Freq: Every evening | ORAL | 1 refills | Status: DC | PRN

## 2019-09-17 NOTE — Telephone Encounter (Signed)
Pt called stating she is having difficulties sleeping. Pt reports father passed unexpectedly last month and pt has been grieving his death. States she is able to fall sleep with the help of melatonin but is unable to stay asleep. Reports vivid dreams that are at times "nightmare like". Pt states in the past she has taken a "sleep and allergy small green pill" that was helpful for her. Writer to call MD for orders.

## 2019-09-17 NOTE — Telephone Encounter (Signed)
Received verbal order for hydroxyzine 25mg , one tablet nightly as needed for sleep. 30 tablets with one refill. Pt was called back and made aware of new prescription. Advised pt to avoid other allergy medications while taking new prescription. Pt verbalized understanding. Denies further questions/concerns at this time.

## 2019-09-21 DIAGNOSIS — F331 Major depressive disorder, recurrent, moderate: Secondary | ICD-10-CM | POA: Diagnosis not present

## 2019-10-08 DIAGNOSIS — Z113 Encounter for screening for infections with a predominantly sexual mode of transmission: Secondary | ICD-10-CM | POA: Diagnosis not present

## 2019-10-08 DIAGNOSIS — L638 Other alopecia areata: Secondary | ICD-10-CM | POA: Diagnosis not present

## 2019-10-08 DIAGNOSIS — A749 Chlamydial infection, unspecified: Secondary | ICD-10-CM | POA: Diagnosis not present

## 2019-10-08 DIAGNOSIS — B350 Tinea barbae and tinea capitis: Secondary | ICD-10-CM | POA: Diagnosis not present

## 2019-10-08 DIAGNOSIS — Z1159 Encounter for screening for other viral diseases: Secondary | ICD-10-CM | POA: Diagnosis not present

## 2019-10-12 ENCOUNTER — Other Ambulatory Visit (HOSPITAL_COMMUNITY): Payer: Self-pay | Admitting: Psychiatry

## 2019-10-12 DIAGNOSIS — G4700 Insomnia, unspecified: Secondary | ICD-10-CM

## 2019-10-13 DIAGNOSIS — F331 Major depressive disorder, recurrent, moderate: Secondary | ICD-10-CM | POA: Diagnosis not present

## 2019-10-20 DIAGNOSIS — F331 Major depressive disorder, recurrent, moderate: Secondary | ICD-10-CM | POA: Diagnosis not present

## 2019-10-23 ENCOUNTER — Telehealth: Payer: Self-pay | Admitting: Family Medicine

## 2019-10-23 NOTE — Telephone Encounter (Signed)
Pt is wanting her immunization record printed off and called when they are ready for pick up. [phone] Please advise

## 2019-10-24 ENCOUNTER — Telehealth (INDEPENDENT_AMBULATORY_CARE_PROVIDER_SITE_OTHER): Payer: Federal, State, Local not specified - PPO | Admitting: Psychiatry

## 2019-10-24 ENCOUNTER — Encounter (HOSPITAL_COMMUNITY): Payer: Self-pay | Admitting: Psychiatry

## 2019-10-24 ENCOUNTER — Other Ambulatory Visit: Payer: Self-pay

## 2019-10-24 DIAGNOSIS — F5102 Adjustment insomnia: Secondary | ICD-10-CM | POA: Diagnosis not present

## 2019-10-24 DIAGNOSIS — F411 Generalized anxiety disorder: Secondary | ICD-10-CM

## 2019-10-24 DIAGNOSIS — F4321 Adjustment disorder with depressed mood: Secondary | ICD-10-CM

## 2019-10-24 MED ORDER — SERTRALINE HCL 100 MG PO TABS: 100.0000 mg | ORAL_TABLET | Freq: Every day | ORAL | 0 refills | Status: DC

## 2019-10-24 MED ORDER — PROPRANOLOL HCL 10 MG PO TABS: 10.0000 mg | ORAL_TABLET | Freq: Two times a day (BID) | ORAL | 0 refills | Status: DC

## 2019-10-24 NOTE — Progress Notes (Signed)
Virtual Visit via Telephone Note  I connected with Jamie Lopez on 10/24/19 at  4:30 PM EDT by telephone and verified that I am speaking with the correct person using two identifiers.  Location: Patient: home Provider: office   I discussed the limitations, risks, security and privacy concerns of performing an evaluation and management service by telephone and the availability of in person appointments. I also discussed with the patient that there may be a patient responsible charge related to this service. The patient expressed understanding and agreed to proceed.   History of Present Illness: After our last visit her father passed away unexpectedly. It was due to OD on substance abuse. It was stressful and she took off from school. She completed 1/2 her semester and is going to finish in the summer. Jamie Lopez is grieving. She is depressed. Jamie Lopez feels she is mentally slow as compared to before. She is not eating much. She is eating 1-2 small meals/day. Jamie Lopez doesn't feel much hunger and has to force herself to eat.  Without meds she is only getting about 3-5 hrs. With Vistaril she will sleep for 10 hrs. Jamie Lopez is trying to work out/walk for 30 min. Jamie Lopez rescued a dog in April and walks him 3-4x/day.  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 She is happy about it and will start in mid June. When her anxiety gets high she usually shuts down. Lately she is being more mindful about her anxiety and triggers. She will use coping skills to decrease her anxiety to a more tolerable level. Jamie Lopez has some random passive thoughts of death. She denies SI/HI.    Observations/Objective:  General Appearance: unable to assess  Eye Contact:  unable to assess  Speech:  Clear and Coherent and Normal Rate  Volume:  Normal  Mood:  Depressed  Affect:  Congruent  Thought Process:  Goal Directed, Linear and Descriptions of Associations: Intact  Orientation:  Full (Time, Place, and Person)  Thought  Content:  Logical  Suicidal Thoughts:  No  Homicidal Thoughts:  No  Memory:  Immediate;   Good  Judgement:  Good  Insight:  Good  Psychomotor Activity: unable to assess  Concentration:  Concentration: Good  Recall:  Good  Fund of Knowledge:  Good  Language:  Good  Akathisia:  unable to assess  Handed:  Right  AIMS (if indicated):     Assets:  Communication Skills Desire for Improvement Financial Resources/Insurance Housing Resilience Social Support Talents/Skills Transportation Vocational/Educational  ADL's:  unable to assess  Cognition:  WNL  Sleep:         Assessment and Plan: Grief; GAD; Insomnia  Zoloft 100mg  po qD  Propranolol 10mg  po BID  Vistaril 25mg  po qHS prn insomnia  She is in grief therapy weekly  Follow Up Instructions: In 4-8 weeks or sooner if needed   I discussed the assessment and treatment plan with the patient. The patient was provided an opportunity to ask questions and all were answered. The patient agreed with the plan and demonstrated an understanding of the instructions.   The patient was advised to call back or seek an in-person evaluation if the symptoms worsen or if the condition fails to improve as anticipated.  I provided 20 minutes of non-face-to-face time during this encounter.   , MD

## 2019-10-31 DIAGNOSIS — Z20822 Contact with and (suspected) exposure to covid-19: Secondary | ICD-10-CM | POA: Diagnosis not present

## 2019-10-31 NOTE — Telephone Encounter (Signed)
I spoke with pt and she no longer needs this.

## 2019-11-03 DIAGNOSIS — F331 Major depressive disorder, recurrent, moderate: Secondary | ICD-10-CM | POA: Diagnosis not present

## 2019-11-08 ENCOUNTER — Other Ambulatory Visit (HOSPITAL_COMMUNITY): Payer: Self-pay | Admitting: Psychiatry

## 2019-11-08 DIAGNOSIS — F411 Generalized anxiety disorder: Secondary | ICD-10-CM

## 2019-11-11 ENCOUNTER — Other Ambulatory Visit (HOSPITAL_COMMUNITY): Payer: Self-pay | Admitting: Psychiatry

## 2019-11-11 DIAGNOSIS — G4700 Insomnia, unspecified: Secondary | ICD-10-CM

## 2019-11-11 DIAGNOSIS — L638 Other alopecia areata: Secondary | ICD-10-CM | POA: Diagnosis not present

## 2019-11-11 DIAGNOSIS — B350 Tinea barbae and tinea capitis: Secondary | ICD-10-CM | POA: Diagnosis not present

## 2019-11-14 ENCOUNTER — Other Ambulatory Visit: Payer: Self-pay

## 2019-11-14 ENCOUNTER — Encounter (HOSPITAL_COMMUNITY): Payer: Self-pay | Admitting: Psychiatry

## 2019-11-14 ENCOUNTER — Telehealth (INDEPENDENT_AMBULATORY_CARE_PROVIDER_SITE_OTHER): Payer: Federal, State, Local not specified - PPO | Admitting: Psychiatry

## 2019-11-14 DIAGNOSIS — F411 Generalized anxiety disorder: Secondary | ICD-10-CM

## 2019-11-14 MED ORDER — SERTRALINE HCL 100 MG PO TABS: 100.0000 mg | ORAL_TABLET | Freq: Every day | ORAL | 0 refills | Status: DC

## 2019-11-14 MED ORDER — PROPRANOLOL HCL 10 MG PO TABS: 10.0000 mg | ORAL_TABLET | Freq: Two times a day (BID) | ORAL | 0 refills | Status: DC

## 2019-11-14 NOTE — Progress Notes (Signed)
Virtual Visit via Telephone Note  I connected with Daniah Harman on 11/14/19 at 10:00 AM EDT by telephone and verified that I am speaking with the correct person using two identifiers.  Location: Patient: home Provider: office   I discussed the limitations, risks, security and privacy concerns of performing an evaluation and management service by telephone and the availability of in person appointments. I also discussed with the patient that there may be a patient responsible charge related to this service. The patient expressed understanding and agreed to proceed.   History of Present Illness: Arena is anxious about starting her job next week. She is excited and nervous at the same time. Peggi is looking forward to having her day filled with work. School is ongoing. The anxiety is ongoing but not pervasive. The Propranolol helps. Jakara has rare days where she wants to isolate and has low motivation. She was depressed but was able to force herself to go out. The last time was in March. She is still grieving the death of her father but it is getting better. Her sleep is good but most days her energy is generally low. Emrys rarely takes Vistaril because she feels very tired the next morning. She denies SI/HI.    Observations/Objective:  General Appearance: unable to assess  Eye Contact:  unable to assess  Speech:  Clear and Coherent and Normal Rate  Volume:  Normal  Mood:  Euthymic  Affect:  Full Range  Thought Process:  Goal Directed, Linear and Descriptions of Associations: Intact  Orientation:  Full (Time, Place, and Person)  Thought Content:  Logical  Suicidal Thoughts:  No  Homicidal Thoughts:  No  Memory:  Immediate;   Good  Judgement:  Good  Insight:  Good  Psychomotor Activity: unable to assess  Concentration:  Concentration: Good  Recall:  Good  Fund of Knowledge:  Good  Language:  Good  Akathisia:  unable to assess  Handed:  Right  AIMS (if indicated):      Assets:  Communication Skills Desire for Improvement Financial Resources/Insurance Housing Leisure Time Physical Health Resilience Social Support Talents/Skills Transportation Vocational/Educational  ADL's:  unable to assess  Cognition:  WNL  Sleep:         Assessment and Plan: GAD; Grief, Insomnia  Zoloft 100mg  po qD  Propranolol 10mg  po qD  Vistaril 25mg  po qHS prn insomnia   Follow Up Instructions: In 2-3 months or sooner if needed   I discussed the assessment and treatment plan with the patient. The patient was provided an opportunity to ask questions and all were answered. The patient agreed with the plan and demonstrated an understanding of the instructions.   The patient was advised to call back or seek an in-person evaluation if the symptoms worsen or if the condition fails to improve as anticipated.  I provided 15 minutes of non-face-to-face time during this encounter.   , MD

## 2019-11-17 DIAGNOSIS — F331 Major depressive disorder, recurrent, moderate: Secondary | ICD-10-CM | POA: Diagnosis not present

## 2019-11-20 DIAGNOSIS — Z1159 Encounter for screening for other viral diseases: Secondary | ICD-10-CM | POA: Diagnosis not present

## 2019-11-20 DIAGNOSIS — N760 Acute vaginitis: Secondary | ICD-10-CM | POA: Diagnosis not present

## 2019-11-20 DIAGNOSIS — Z113 Encounter for screening for infections with a predominantly sexual mode of transmission: Secondary | ICD-10-CM | POA: Diagnosis not present

## 2019-12-08 DIAGNOSIS — F331 Major depressive disorder, recurrent, moderate: Secondary | ICD-10-CM | POA: Diagnosis not present

## 2019-12-21 DIAGNOSIS — Z20822 Contact with and (suspected) exposure to covid-19: Secondary | ICD-10-CM | POA: Diagnosis not present

## 2019-12-23 ENCOUNTER — Encounter: Payer: Self-pay | Admitting: Registered Nurse

## 2019-12-23 ENCOUNTER — Other Ambulatory Visit: Payer: Self-pay

## 2019-12-23 ENCOUNTER — Other Ambulatory Visit (HOSPITAL_COMMUNITY)
Admission: RE | Admit: 2019-12-23 | Discharge: 2019-12-23 | Disposition: A | Discharge status: Home / Self Care | Payer: Federal, State, Local not specified - PPO | Source: Ambulatory Visit | Attending: Registered Nurse | Admitting: Registered Nurse

## 2019-12-23 ENCOUNTER — Ambulatory Visit: Payer: Federal, State, Local not specified - PPO | Admitting: Registered Nurse

## 2019-12-23 VITALS — BP 105/72 | HR 75 | Temp 98.1°F | Resp 18 | Ht 62.0 in | Wt 185.4 lb

## 2019-12-23 DIAGNOSIS — Z130 Encounter for screening for diseases of the blood and blood-forming organs and certain disorders involving the immune mechanism: Secondary | ICD-10-CM

## 2019-12-23 DIAGNOSIS — Z1329 Encounter for screening for other suspected endocrine disorder: Secondary | ICD-10-CM | POA: Diagnosis not present

## 2019-12-23 DIAGNOSIS — Z13220 Encounter for screening for lipoid disorders: Secondary | ICD-10-CM | POA: Diagnosis not present

## 2019-12-23 DIAGNOSIS — Z13228 Encounter for screening for other metabolic disorders: Secondary | ICD-10-CM | POA: Diagnosis not present

## 2019-12-23 DIAGNOSIS — R4582 Worries: Secondary | ICD-10-CM | POA: Diagnosis not present

## 2019-12-23 DIAGNOSIS — L638 Other alopecia areata: Secondary | ICD-10-CM | POA: Diagnosis not present

## 2019-12-23 DIAGNOSIS — Z113 Encounter for screening for infections with a predominantly sexual mode of transmission: Secondary | ICD-10-CM

## 2019-12-23 NOTE — Progress Notes (Signed)
Established Patient Office Visit  Subjective:  Patient ID: Jamie Lopez, female    DOB: [birth year]  Age: 24 y.o. MRN: [id number]  CC:  Chief Complaint  Patient presents with  . Transitions Of Care    patient states she is here for transfer of care. Per patient she would routine STD screening. Per patient she has no other concerns  . Medication Refill    on pended medications    HPI Jamie Lopez presents for TOC  Requesting STD screening - no symptoms, no known exposure. Routine screening.  Otherwise, notes that she lost her father to substance abuse in March - has been difficult to cope but has felt better in previous weeks.  Requesting routine labs - strong family hx of t2dm and hld.  No other concerns at this time.   Past Medical History:  Diagnosis Date  . Anxiety   . Asthma   . Headache(784.0)   . Rosacea   . Seasonal allergies     Past Surgical History:  Procedure Laterality Date  . NO PAST SURGERIES    . WISDOM TOOTH EXTRACTION      Family History  Problem Relation Age of Onset  . Anxiety disorder Mother   . Diabetes Mellitus II Mother   . Depression Mother   . Anxiety disorder Maternal Grandfather   . Depression Maternal Grandfather   . Alcohol abuse Maternal Grandfather   . Drug abuse Maternal Grandfather   . Anxiety disorder Maternal Grandmother   . Diabetes Mellitus II Maternal Grandmother   . Depression Maternal Grandmother   . Alcohol abuse Paternal Grandfather   . Drug abuse Paternal Grandfather   . Bipolar disorder Cousin   . Schizophrenia Cousin   . Drug abuse Father     Social History   Socioeconomic History  . Marital status: Single    Spouse name: Not on file  . Number of children: 0  . Years of education: 35  . Highest education level: Not on file  Occupational History  . Occupation: student    Comment: [employer]  Tobacco Use  . Smoking status: Light Tobacco Smoker    Types: Cigarettes, Cigars    Last attempt to quit:  05/11/2013    Years since quitting: 6.6  . Smokeless tobacco: Never Used  . Tobacco comment: once a year  Vaping Use  . Vaping Use: Never used  Substance and Sexual Activity  . Alcohol use: Yes    Alcohol/week: 2.0 standard drinks    Types: 2 Glasses of wine per week    Comment: social: 1-2x/week  . Drug use: Not Currently    Types: Marijuana    Comment: quit in 2018  . Sexual activity: Yes    Partners: Male    Birth control/protection: Condom  Other Topics Concern  . Not on file  Social History Narrative   Pt lives in [place] with mom and brother. Pt goes to [employer] and pt is a sophomore. Pt was born in [place] and raised by mom. Pt has 4 siblings and 1 on the way on her dad's side. Childhood was nice and she mostly raised as an child.  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      Social Determinants of Health   Financial Resource Strain:   . Difficulty of Paying Living Expenses:   Food Insecurity:   . Worried About Programme researcher, broadcasting/film/video in the Last Year:   . [employer]  in the Last Year:   Transportation Needs:   . Freight forwarder (Medical):   Marland Kitchen Lack of Transportation (Non-Medical):   Physical Activity:   . Days of Exercise per Week:   . Minutes of Exercise per Session:   Stress:   . Feeling of Stress :   Social Connections:   . Frequency of Communication with Friends and Family:   . Frequency of Social Gatherings with Friends and Family:   . Attends Religious Services:   . Active Member of Clubs or Organizations:   . Attends Banker Meetings:   Marland Kitchen Marital Status:   Intimate Partner Violence:   . Fear of Current or Ex-Partner:   . Emotionally Abused:   Marland Kitchen Physically Abused:   . Sexually Abused:     Outpatient Medications Prior to Visit  Medication Sig Dispense Refill  . albuterol (PROVENTIL HFA;VENTOLIN HFA) 108 (90 Base) MCG/ACT inhaler Inhale 1-2 puffs into the lungs every 6 (six) hours as needed for wheezing or shortness  of breath. 1 Inhaler 0  . budesonide (RHINOCORT ALLERGY) 32 MCG/ACT nasal spray Place 1-2 sprays into both nostrils daily. 8.6 g 6  . cetirizine (ZYRTEC) 10 MG tablet Take 1 tablet (10 mg total) by mouth daily. 90 tablet 3  . etonogestrel-ethinyl estradiol (NUVARING) 0.12-0.015 MG/24HR vaginal ring Place 1 each vaginally every 28 (twenty-eight) days. Insert vaginally and leave in place for 3 consecutive weeks, then remove for 1 week.    . hydrOXYzine (VISTARIL) 25 MG capsule Take 1 capsule (25 mg total) by mouth at bedtime as needed for anxiety (sleep). 30 capsule 1  . naproxen (NAPROSYN) 500 MG tablet Take 1 tablet (500 mg total) by mouth 2 (two) times daily. 30 tablet 0  . Olopatadine HCl (PATADAY) 0.2 % SOLN Apply 1 drop to eye daily as needed. 2.5 mL 3  . PRESCRIPTION MEDICATION     . propranolol (INDERAL) 10 MG tablet Take 1 tablet (10 mg total) by mouth 2 (two) times daily. 180 tablet 0  . rizatriptan (MAXALT-MLT) 10 MG disintegrating tablet Take 10 mg by mouth as needed for migraine. May repeat in 2 hours if needed    . sertraline (ZOLOFT) 100 MG tablet Take 1 tablet (100 mg total) by mouth daily. 90 tablet 0  . triamcinolone cream (KENALOG) 0.1 % Apply 1 application topically 2 (two) times daily. 30 g 0  . terbinafine (LAMISIL) 250 MG tablet Take 250 mg by mouth daily.     No facility-administered medications prior to visit.    Allergies  Allergen Reactions  . Fruit & Vegetable Daily [Nutritional Supplements]   . Peanut-Containing Drug Products     Tree nut specifically   . Other     ROS Review of Systems  Constitutional: Negative.   HENT: Negative.   Eyes: Negative.   Respiratory: Negative.   Cardiovascular: Negative.   Gastrointestinal: Negative.   Endocrine: Negative.   Genitourinary: Negative.   Musculoskeletal: Negative.   Skin: Negative.   Allergic/Immunologic: Negative.   Neurological: Negative.   Hematological: Negative.   Psychiatric/Behavioral: Negative.    All other systems reviewed and are negative.     Objective:    Physical Exam Vitals and nursing note reviewed.  Constitutional:      General: She is not in acute distress.    Appearance: Normal appearance. She is obese. She is not ill-appearing, toxic-appearing or diaphoretic.  Cardiovascular:     Rate and Rhythm: Normal rate.  Skin:    Capillary  Refill: Capillary refill takes less than 2 seconds.  Neurological:     General: No focal deficit present.     Mental Status: She is alert and oriented to person, place, and time. Mental status is at baseline.  Psychiatric:        Mood and Affect: Mood normal.        Behavior: Behavior normal.        Thought Content: Thought content normal.        Judgment: Judgment normal.     BP 105/72   Pulse 75   Temp 98.1 F (36.7 C) (Temporal)   Resp 18   Ht 5\' 2"  (1.575 m)   Wt 185 lb 6.4 oz (84.1 kg)   SpO2 93%   BMI 33.91 kg/m  Wt Readings from Last 3 Encounters:  12/23/19 185 lb 6.4 oz (84.1 kg)  01/02/19 175 lb (79.4 kg)  06/06/18 180 lb 9.6 oz (81.9 kg)     Health Maintenance Due  Topic Date Due  . PAP-Cervical Cytology Screening  12/30/2019  . PAP SMEAR-Modifier  12/30/2019    There are no preventive care reminders to display for this patient.  Lab Results  Component Value Date   TSH 1.010 01/02/2019   Lab Results  Component Value Date   WBC 3.6 01/02/2019   HGB 12.2 01/02/2019   HCT 35.9 01/02/2019   MCV 87 01/02/2019   PLT 359 01/02/2019   Lab Results  Component Value Date   NA 138 01/02/2019   K 4.1 01/02/2019   CO2 20 01/02/2019   GLUCOSE 83 01/02/2019   BUN 9 01/02/2019   CREATININE 0.62 01/02/2019   BILITOT 0.4 01/02/2019   ALKPHOS 76 01/02/2019   AST 22 01/02/2019   ALT 20 01/02/2019   PROT 7.4 01/02/2019   ALBUMIN 3.9 01/02/2019   CALCIUM 9.2 01/02/2019   ANIONGAP 8 05/31/2018   Lab Results  Component Value Date   CHOL 151 01/02/2019   Lab Results  Component Value Date   HDL 58  01/02/2019   Lab Results  Component Value Date   LDLCALC 83 01/02/2019   Lab Results  Component Value Date   TRIG 52 01/02/2019   Lab Results  Component Value Date   CHOLHDL 2.6 01/02/2019   Lab Results  Component Value Date   HGBA1C 5.4 01/02/2019      Assessment & Plan:   Problem List Items Addressed This Visit    None    Visit Diagnoses    Screening for endocrine, metabolic and immunity disorder    -  Primary   Relevant Orders   TSH   Hemoglobin A1c   CBC with Differential   Comprehensive metabolic panel   Lipid screening       Relevant Orders   Lipid panel   Routine screening for STI (sexually transmitted infection)       Relevant Orders   HIV antibody (with reflex)   RPR   Urine cytology ancillary only   Worries       Relevant Orders   HIV antibody (with reflex)   RPR   Urine cytology ancillary only      No orders of the defined types were placed in this encounter.   Follow-up: No follow-ups on file.   PLAN  Labs collected, will follow up as warranted  Discussed grief process and coping - patient has been seen for individual and group therapy, going well  Patient encouraged to call clinic with any questions, comments,  or concerns.  Ioanna Colquhoun, NP 

## 2019-12-23 NOTE — Patient Instructions (Signed)
° ° ° °  If you have lab work done today you will be contacted with your lab results within the next 2 weeks.  If you have not heard from us then please contact us. The fastest way to get your results is to register for My Chart. ° ° °IF you received an x-ray today, you will receive an invoice from [facility]. Please contact [facility] at [phone] with questions or concerns regarding your invoice.  ° °IF you received labwork today, you will receive an invoice from [facility]. Please contact [facility] at [phone] with questions or concerns regarding your invoice.  ° °Our billing staff will not be able to assist you with questions regarding bills from these companies. ° °You will be contacted with the lab results as soon as they are available. The fastest way to get your results is to activate your My Chart account. Instructions are located on the last page of this paperwork. If you have not heard from us regarding the results in 2 weeks, please contact this office. °  ° ° ° °

## 2019-12-24 LAB — URINE CYTOLOGY ANCILLARY ONLY
Chlamydia: NEGATIVE
Comment: NEGATIVE
Comment: NEGATIVE
Comment: NORMAL
Neisseria Gonorrhea: NEGATIVE
Trichomonas: NEGATIVE

## 2019-12-24 LAB — COMPREHENSIVE METABOLIC PANEL
ALT: 27 IU/L (ref 0–32)
AST: 25 IU/L (ref 0–40)
Albumin/Globulin Ratio: 1.1 — ABNORMAL LOW (ref 1.2–2.2)
Albumin: 4.1 g/dL (ref 3.9–5.0)
Alkaline Phosphatase: 78 IU/L (ref 48–121)
BUN/Creatinine Ratio: 13 (ref 9–23)
BUN: 8 mg/dL (ref 6–20)
Bilirubin Total: <0.2 mg/dL (ref 0.0–1.2)
CO2: 20 mmol/L (ref 20–29)
Calcium: 8.9 mg/dL (ref 8.7–10.2)
Chloride: 107 mmol/L — ABNORMAL HIGH (ref 96–106)
Creatinine, Ser: 0.6 mg/dL (ref 0.57–1.00)
GFR calc Af Amer: 148 mL/min/{1.73_m2} (ref >59)
GFR calc non Af Amer: 128 mL/min/{1.73_m2} (ref >59)
Globulin, Total: 3.9 g/dL (ref 1.5–4.5)
Glucose: 110 mg/dL — ABNORMAL HIGH (ref 65–99)
Potassium: 4.1 mmol/L (ref 3.5–5.2)
Sodium: 140 mmol/L (ref 134–144)
Total Protein: 8 g/dL (ref 6.0–8.5)

## 2019-12-24 LAB — CBC WITH DIFFERENTIAL/PLATELET
Basophils Absolute: 0 10*3/uL (ref 0.0–0.2)
Basos: 0 %
EOS (ABSOLUTE): 0.1 10*3/uL (ref 0.0–0.4)
Eos: 2 %
Hematocrit: 36.5 % (ref 34.0–46.6)
Hemoglobin: 11.9 g/dL (ref 11.1–15.9)
Immature Grans (Abs): 0 10*3/uL (ref 0.0–0.1)
Immature Granulocytes: 0 %
Lymphocytes Absolute: 1.5 10*3/uL (ref 0.7–3.1)
Lymphs: 32 %
MCH: 28.5 pg (ref 26.6–33.0)
MCHC: 32.6 g/dL (ref 31.5–35.7)
MCV: 88 fL (ref 79–97)
Monocytes Absolute: 0.4 10*3/uL (ref 0.1–0.9)
Monocytes: 10 %
Neutrophils Absolute: 2.5 10*3/uL (ref 1.4–7.0)
Neutrophils: 56 %
Platelets: 354 10*3/uL (ref 150–450)
RBC: 4.17 x10E6/uL (ref 3.77–5.28)
RDW: 12.5 % (ref 11.7–15.4)
WBC: 4.5 10*3/uL (ref 3.4–10.8)

## 2019-12-24 LAB — TSH: TSH: 0.682 u[IU]/mL (ref 0.450–4.500)

## 2019-12-24 LAB — LIPID PANEL
Chol/HDL Ratio: 3.3 ratio (ref 0.0–4.4)
Cholesterol, Total: 157 mg/dL (ref 100–199)
HDL: 48 mg/dL (ref >39)
LDL Chol Calc (NIH): 95 mg/dL (ref 0–99)
Triglycerides: 70 mg/dL (ref 0–149)
VLDL Cholesterol Cal: 14 mg/dL (ref 5–40)

## 2019-12-24 LAB — HEMOGLOBIN A1C
Est. average glucose Bld gHb Est-mCnc: 114 mg/dL
Hgb A1c MFr Bld: 5.6 % (ref 4.8–5.6)

## 2019-12-24 LAB — RPR: RPR Ser Ql: NONREACTIVE

## 2019-12-24 LAB — HIV ANTIBODY (ROUTINE TESTING W REFLEX): HIV Screen 4th Generation wRfx: NONREACTIVE

## 2019-12-25 ENCOUNTER — Ambulatory Visit: Payer: Federal, State, Local not specified - PPO

## 2019-12-26 ENCOUNTER — Encounter: Payer: Self-pay | Admitting: Registered Nurse

## 2019-12-26 DIAGNOSIS — F331 Major depressive disorder, recurrent, moderate: Secondary | ICD-10-CM | POA: Diagnosis not present

## 2019-12-26 IMAGING — CR DG CHEST 2V
2 series · 2 of 2 positions shown · non-contrast
Comparison: None.

CLINICAL DATA: Intermittent left-sided chest pain today.

EXAM:
CHEST - 2 VIEW

[w chest pa]
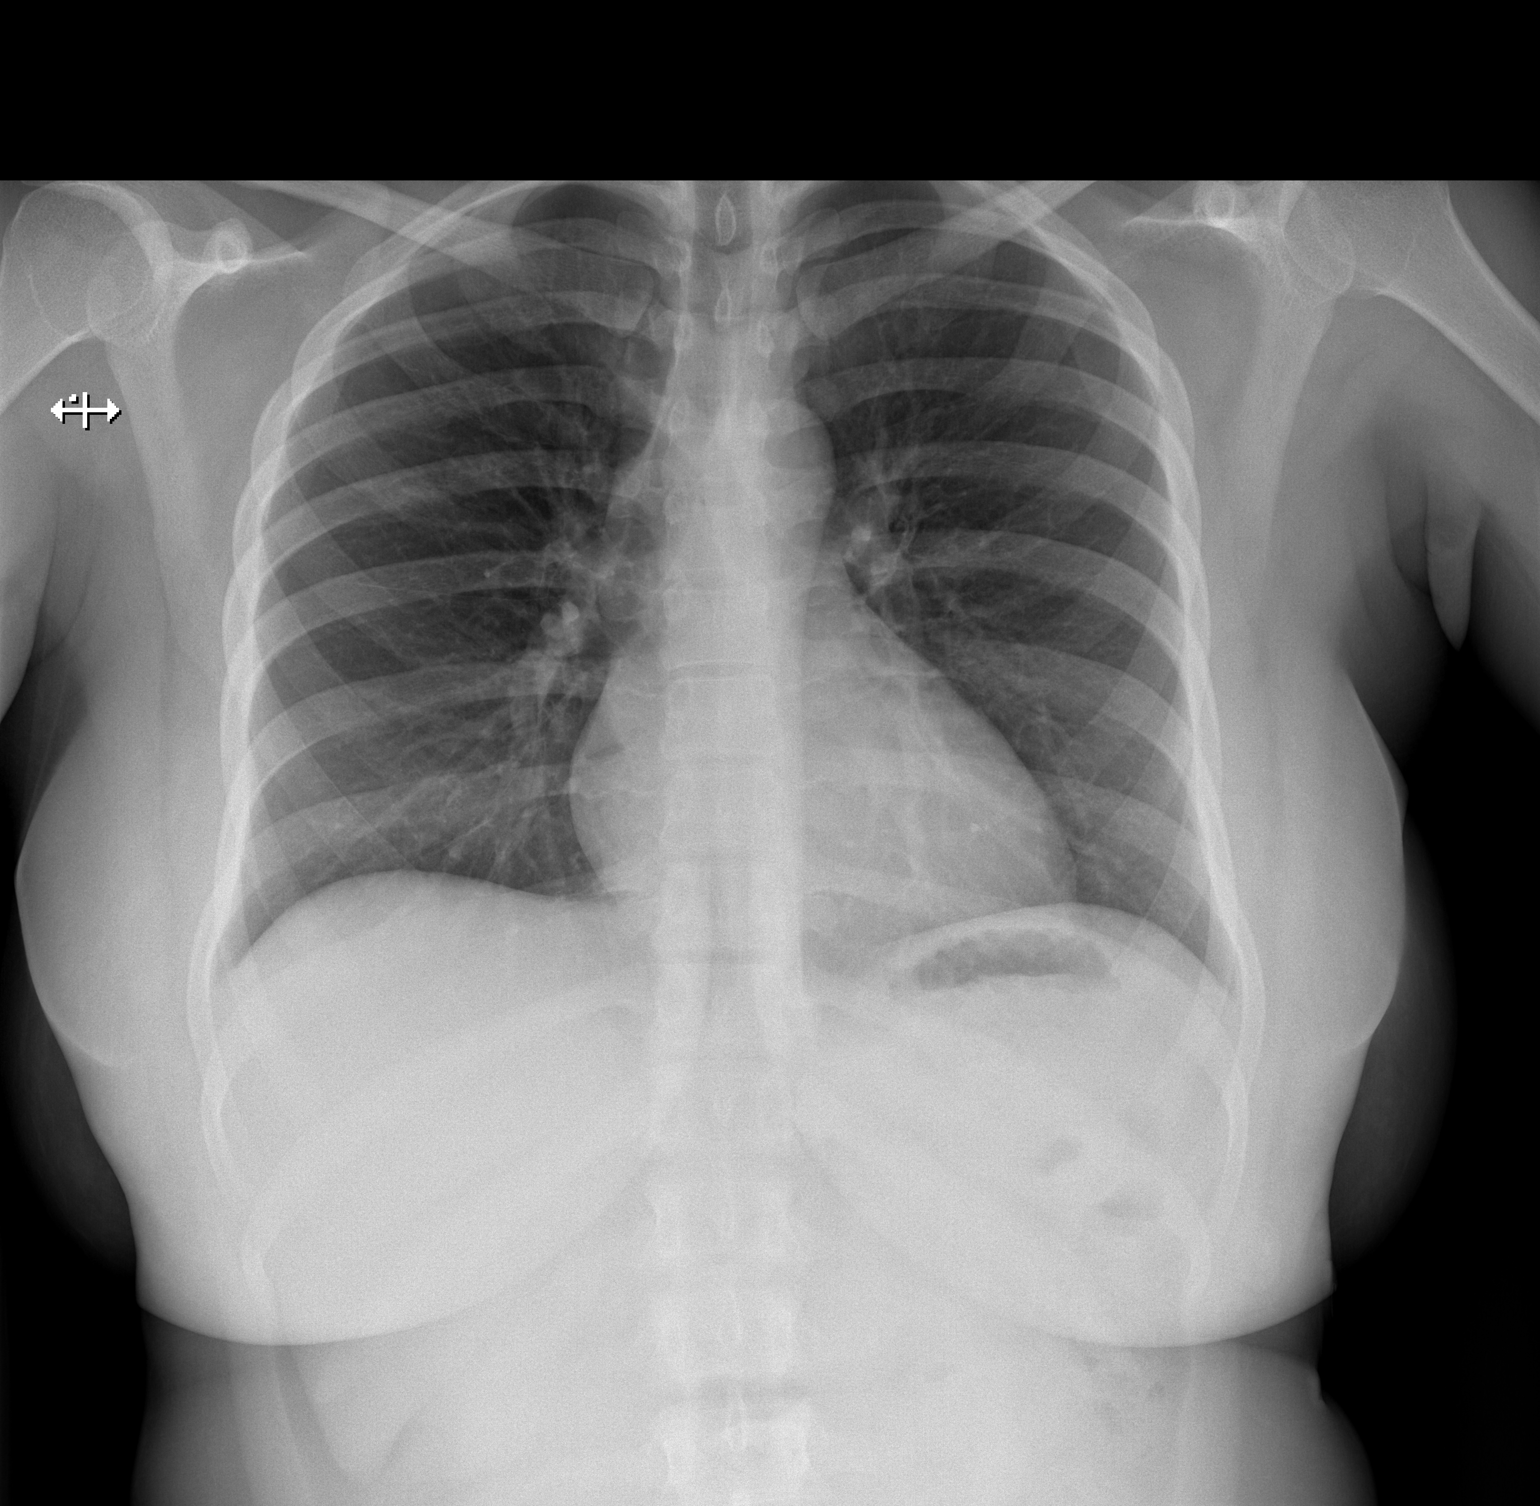

[w chest lat]
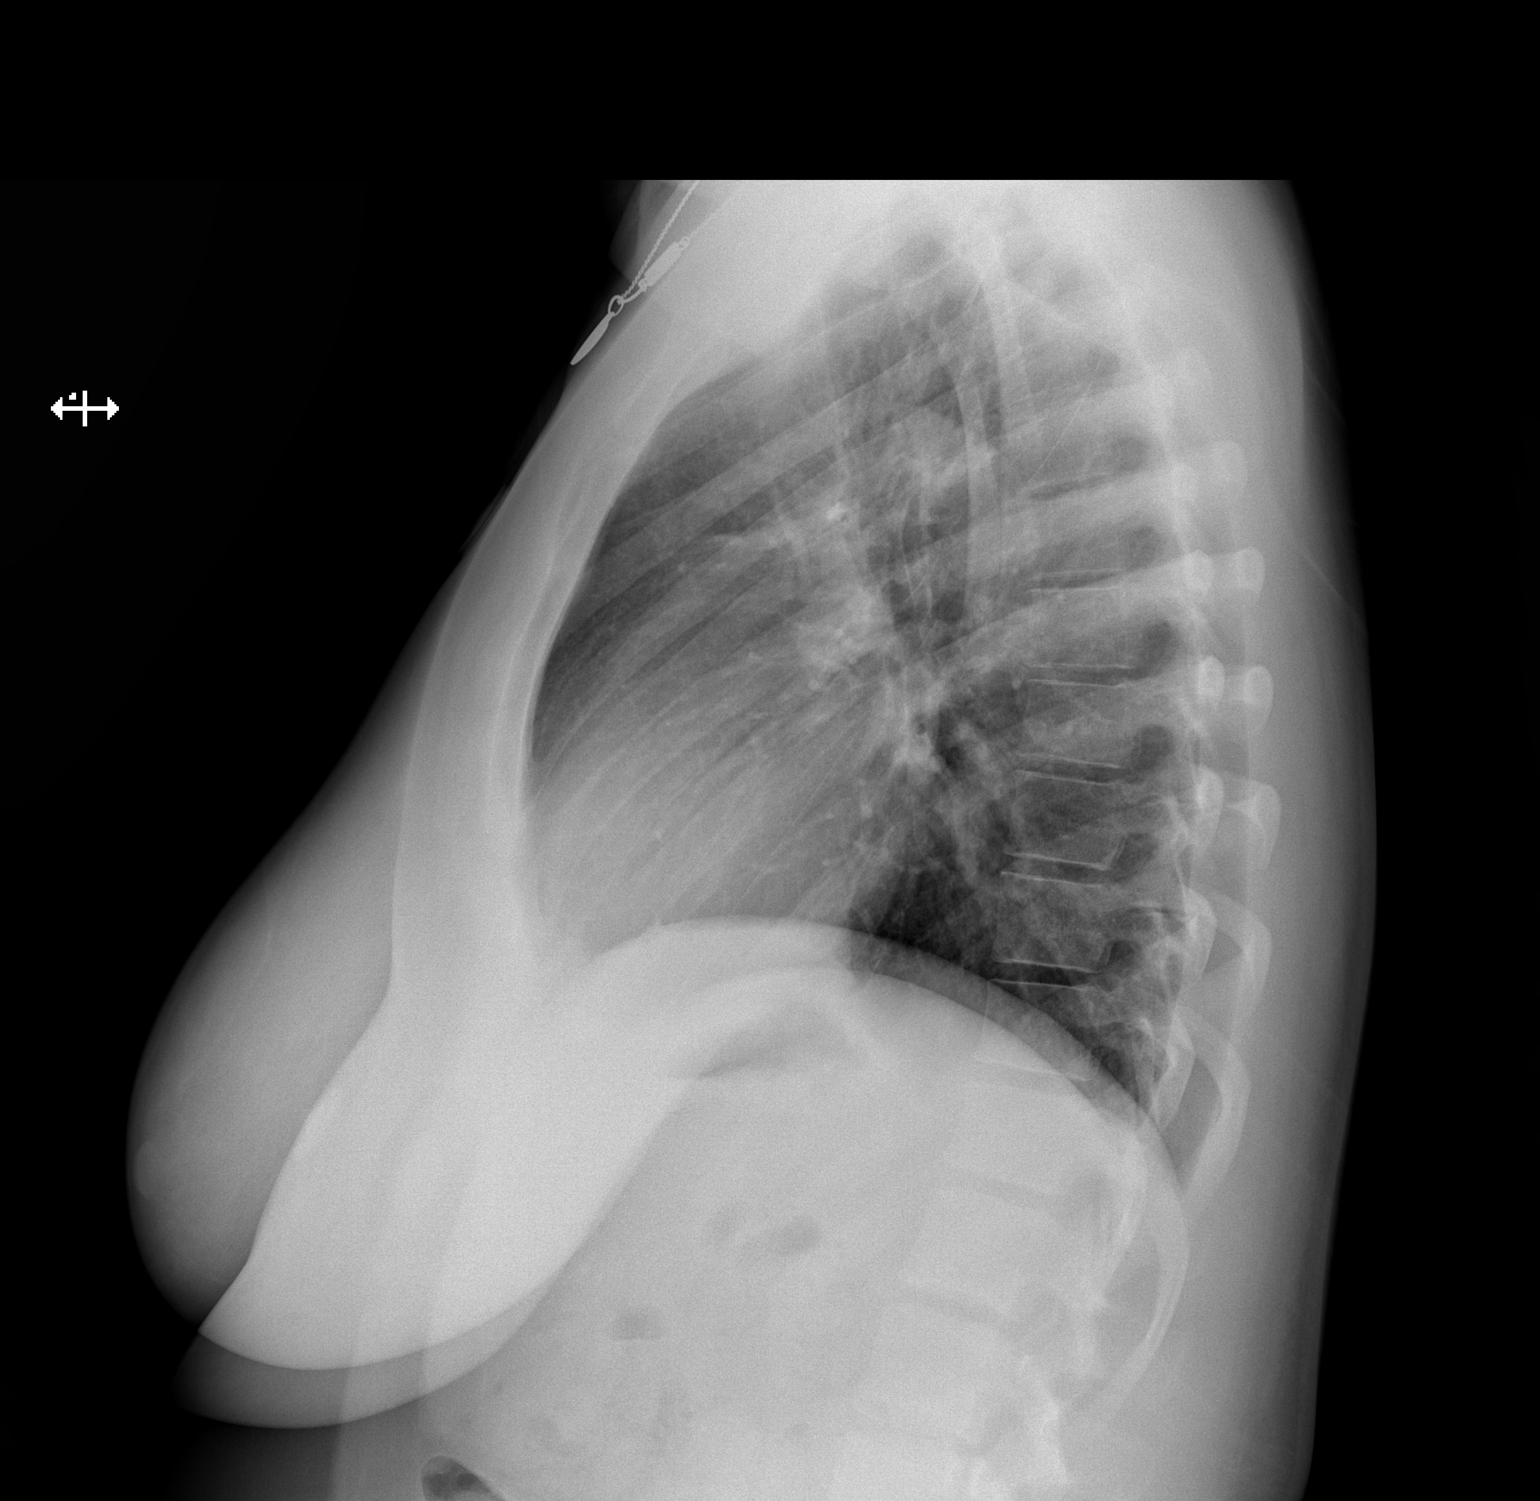

[2 of 2 positions shown; findings below may reference images not displayed]

FINDINGS: The heart size and mediastinal contours are within normal limits.
Both lungs are clear. The visualized skeletal structures are
unremarkable.
IMPRESSION: No active cardiopulmonary disease.

## 2020-01-16 ENCOUNTER — Other Ambulatory Visit: Payer: Self-pay

## 2020-01-16 ENCOUNTER — Telehealth (INDEPENDENT_AMBULATORY_CARE_PROVIDER_SITE_OTHER): Payer: Federal, State, Local not specified - PPO | Admitting: Psychiatry

## 2020-01-16 DIAGNOSIS — F331 Major depressive disorder, recurrent, moderate: Secondary | ICD-10-CM

## 2020-01-16 DIAGNOSIS — F411 Generalized anxiety disorder: Secondary | ICD-10-CM | POA: Diagnosis not present

## 2020-01-16 DIAGNOSIS — G4700 Insomnia, unspecified: Secondary | ICD-10-CM | POA: Diagnosis not present

## 2020-01-16 MED ORDER — PROPRANOLOL HCL 10 MG PO TABS: 10.0000 mg | ORAL_TABLET | Freq: Two times a day (BID) | ORAL | 0 refills | Status: DC

## 2020-01-16 MED ORDER — HYDROXYZINE PAMOATE 25 MG PO CAPS: ORAL_CAPSULE | ORAL | 1 refills | Status: DC

## 2020-01-16 MED ORDER — SERTRALINE HCL 100 MG PO TABS: 150.0000 mg | ORAL_TABLET | Freq: Every day | ORAL | 0 refills | Status: DC

## 2020-01-16 NOTE — Progress Notes (Signed)
Virtual Visit via Telephone Note  I connected with Jamie Lopez on 01/16/20 at 10:15 AM EDT by telephone and verified that I am speaking with the correct person using two identifiers.  Location: Patient: home Provider: office   I discussed the limitations, risks, security and privacy concerns of performing an evaluation and management service by telephone and the availability of in person appointments. I also discussed with the patient that there may be a patient responsible charge related to this service. The patient expressed understanding and agreed to proceed.   History of Present Illness: Jamie Lopez shares that she has started work at the [employer] [facility]. Her classes started yesterday. She is very stressed. The things she has seen in the ED have been hard to deal with. She has noticed that her depression is worsening. There are days where she wakes up and doesn't have any motivation to do anything. Jamie Lopez is able to force herself to go to work. Her anxiety is manageable. Sleep has been poor. It takes her a long time to fall asleep. Jamie Lopez denies SI/HI.    Observations/Objective:  General Appearance: unable to assess  Eye Contact:  unable to assess  Speech:  Clear and Coherent and Normal Rate  Volume:  Normal  Mood:  Anxious and Depressed  Affect:  Congruent  Thought Process:  Goal Directed, Linear and Descriptions of Associations: Intact  Orientation:  Full (Time, Place, and Person)  Thought Content:  Logical  Suicidal Thoughts:  No  Homicidal Thoughts:  No  Memory:  Immediate;   Good  Judgement:  Good  Insight:  Good  Psychomotor Activity: unable to assess  Concentration:  Concentration: Good  Recall:  Good  Fund of Knowledge:  Good  Language:  Good  Akathisia:  unable to assess  Handed:  Right  AIMS (if indicated):     Assets:  Communication Skills Desire for Improvement Financial Resources/Insurance Housing Leisure Time Physical Health Resilience Social  Support Talents/Skills Transportation Vocational/Educational  ADL's:  unable to assess  Cognition:  WNL  Sleep:         Assessment and Plan:  MDD- recurrent, moderate; GAD  Increase Zoloft 150mg  po qD  Propranolol 10mg  po qD  Increase Vistaril 25-50mg  po qHS prn insomnia  Follow Up Instructions: In 2 months or sooner if needed   I discussed the assessment and treatment plan with the patient. The patient was provided an opportunity to ask questions and all were answered. The patient agreed with the plan and demonstrated an understanding of the instructions.   The patient was advised to call back or seek an in-person evaluation if the symptoms worsen or if the condition fails to improve as anticipated.  I provided 10 minutes of non-face-to-face time during this encounter.   , MD

## 2020-01-22 DIAGNOSIS — Z20822 Contact with and (suspected) exposure to covid-19: Secondary | ICD-10-CM | POA: Diagnosis not present

## 2020-01-27 ENCOUNTER — Ambulatory Visit (HOSPITAL_COMMUNITY)
Admission: EM | Admit: 2020-01-27 | Discharge: 2020-01-27 | Disposition: A | Discharge status: Home / Self Care | Payer: Federal, State, Local not specified - PPO | Attending: Family Medicine | Admitting: Family Medicine

## 2020-01-27 ENCOUNTER — Other Ambulatory Visit: Payer: Self-pay

## 2020-01-27 ENCOUNTER — Emergency Department (HOSPITAL_COMMUNITY)
Admission: EM | Admit: 2020-01-27 | Discharge: 2020-01-27 | Discharge status: Against Medical Advice | Payer: Federal, State, Local not specified - PPO

## 2020-01-27 ENCOUNTER — Encounter (HOSPITAL_COMMUNITY): Payer: Self-pay | Admitting: Emergency Medicine

## 2020-01-27 DIAGNOSIS — L0291 Cutaneous abscess, unspecified: Secondary | ICD-10-CM

## 2020-01-27 MED ORDER — FLUCONAZOLE 150 MG PO TABS: 150.0000 mg | ORAL_TABLET | Freq: Every day | ORAL | 0 refills | Status: DC

## 2020-01-27 MED ORDER — LIDOCAINE-EPINEPHRINE 1 %-1:100000 IJ SOLN
INTRAMUSCULAR | Status: AC
  Filled 2020-01-27: qty 1

## 2020-01-27 MED ORDER — AMOXICILLIN-POT CLAVULANATE 875-125 MG PO TABS: 1.0000 | ORAL_TABLET | Freq: Two times a day (BID) | ORAL | 0 refills | Status: DC

## 2020-01-27 MED ORDER — HYDROCODONE-ACETAMINOPHEN 5-325 MG PO TABS: 1.0000 | ORAL_TABLET | Freq: Four times a day (QID) | ORAL | 0 refills | Status: DC | PRN

## 2020-01-27 NOTE — Discharge Instructions (Signed)
Warm compresses or warm soaks to area The gauze in the opening will fall out in a day or 2 Keep the dressing on as long as it is draining Change antibiotics from doxycycline to Augmentin 1 twice a day I have prescribed Diflucan in case you needed Return as needed

## 2020-01-27 NOTE — ED Triage Notes (Signed)
Pt c/o abscess on buttock area x 3-4 days. Pt states she has been taking advil and ibuprofen every 4 hours, she also has been taking a prescription for doxycycline. Pt is having trouble walking and sitting.

## 2020-01-27 NOTE — ED Provider Notes (Signed)
[facility]    CSN: [id number] Arrival date & time: 01/27/20  1150      History   Chief Complaint Chief Complaint  Patient presents with  . Abscess    HPI Jamie Lopez is a 24 y.o. female.   HPI  Patient has an abscess.  Its at the top of her gluteal crease.  Is been present for about 4 days.  She states is very painful.  She states she is taking ibuprofen for pain with no relief.  She works at [employer].  One of the PAs was helping her and called in doxycycline.  This is helped somewhat, but the pain is becoming unbearable.  She is otherwise in good health takes Rhinocort and Zyrtec for allergies with albuterol as needed  Past Medical History:  Diagnosis Date  . Anxiety   . Asthma   . Headache(784.0)   . Rosacea   . Seasonal allergies     Patient Active Problem List   Diagnosis Date Noted  . GAD (generalized anxiety disorder) 10/25/2012  . Migraines 10/25/2012  . Migraine 05/28/2011    Past Surgical History:  Procedure Laterality Date  . NO PAST SURGERIES    . WISDOM TOOTH EXTRACTION      OB History   No obstetric history on file.      Home Medications    Prior to Admission medications   Medication Sig Start Date End Date Taking? Authorizing Provider  albuterol (PROVENTIL HFA;VENTOLIN HFA) 108 (90 Base) MCG/ACT inhaler Inhale 1-2 puffs into the lungs every 6 (six) hours as needed for wheezing or shortness of breath. 09/29/17  Yes Law, Alexandra M, PA-C  budesonide (RHINOCORT ALLERGY) 32 MCG/ACT nasal spray Place 1-2 sprays into both nostrils daily. 08/25/18  Yes Shade Flood, MD  cetirizine (ZYRTEC) 10 MG tablet Take 1 tablet (10 mg total) by mouth daily. 08/25/18  Yes Shade Flood, MD  etonogestrel-ethinyl estradiol (NUVARING) 0.12-0.015 MG/24HR vaginal ring Place 1 each vaginally every 28 (twenty-eight) days. Insert vaginally and leave in place for 3 consecutive weeks, then remove for 1 week.   Yes [provider]  hydrOXYzine (VISTARIL) 25 MG capsule Take 25-50mg  po qHS prn insomnia 01/16/20  Yes Oletta Darter, MD  naproxen (NAPROSYN) 500 MG tablet Take 1 tablet (500 mg total) by mouth 2 (two) times daily. 06/01/18  Yes Kendrick, Caitlyn S, PA-C  Olopatadine HCl (PATADAY) 0.2 % SOLN Apply 1 drop to eye daily as needed. 08/25/18  Yes Shade Flood, MD  PRESCRIPTION MEDICATION    Yes [provider]  propranolol (INDERAL) 10 MG tablet Take 1 tablet (10 mg total) by mouth 2 (two) times daily. 01/16/20  Yes Oletta Darter, MD  rizatriptan (MAXALT-MLT) 10 MG disintegrating tablet Take 10 mg by mouth as needed for migraine. May repeat in 2 hours if needed   Yes [provider]  sertraline (ZOLOFT) 100 MG tablet Take 1.5 tablets (150 mg total) by mouth daily. 01/16/20  Yes Oletta Darter, MD  triamcinolone cream (KENALOG) 0.1 % Apply 1 application topically 2 (two) times daily. 01/02/19  Yes Janeece Agee, NP  amoxicillin-clavulanate (AUGMENTIN) 875-125 MG tablet Take 1 tablet by mouth every 12 (twelve) hours. 01/27/20   Eustace Moore, MD  fluconazole (DIFLUCAN) 150 MG tablet Take 1 tablet (150 mg total) by mouth daily. Repeat in 1 week if needed 01/27/20   Eustace Moore, MD  HYDROcodone-acetaminophen (NORCO/VICODIN) 5-325 MG tablet Take 1-2 tablets by mouth every  6 (six) hours as needed. 01/27/20   Eustace Moore, MD    Family History Family History  Problem Relation Age of Onset  . Anxiety disorder Mother   . Diabetes Mellitus II Mother   . Depression Mother   . Anxiety disorder Maternal Grandfather   . Depression Maternal Grandfather   . Alcohol abuse Maternal Grandfather   . Drug abuse Maternal Grandfather   . Anxiety disorder Maternal Grandmother   . Diabetes Mellitus II Maternal Grandmother   . Depression Maternal Grandmother   . Alcohol abuse Paternal Grandfather   . Drug abuse Paternal Grandfather   . Bipolar disorder Cousin   . Schizophrenia Cousin   . Drug  abuse Father     Social History Social History   Tobacco Use  . Smoking status: Light Tobacco Smoker    Types: Cigarettes, Cigars    Last attempt to quit: 05/11/2013    Years since quitting: 6.7  . Smokeless tobacco: Never Used  . Tobacco comment: once a year  Vaping Use  . Vaping Use: Never used  Substance Use Topics  . Alcohol use: Yes    Alcohol/week: 2.0 standard drinks    Types: 2 Glasses of wine per week    Comment: social: 1-2x/week  . Drug use: Not Currently    Types: Marijuana    Comment: quit in 2018     Allergies   Fruit & vegetable daily [nutritional supplements], Peanut-containing drug products, and Other   Review of Systems Review of Systems See HPI  Physical Exam Triage Vital Signs ED Triage Vitals  Enc Vitals Group     BP 01/27/20 1423 (!) 128/92     Pulse Rate 01/27/20 1423 87     Resp 01/27/20 1423 16     Temp 01/27/20 1423 98.5 F (36.9 C)     Temp Source 01/27/20 1423 Oral     SpO2 01/27/20 1423 99 %     Weight --      Height --      Head Circumference --      Peak Flow --      Pain Score 01/27/20 1419 7     Pain Loc --      Pain Edu? --      Excl. in GC? --    No data found.  Updated Vital Signs BP (!) 128/92 (BP Location: Right Arm)   Pulse 87   Temp 98.5 F (36.9 C) (Oral)   Resp 16   LMP 12/27/2019 (Approximate)   SpO2 99%      Physical Exam Constitutional:      General: She is not in acute distress.    Appearance: She is well-developed and normal weight.  HENT:     Head: Normocephalic and atraumatic.     Mouth/Throat:     Comments: Mask is in place Eyes:     Conjunctiva/sclera: Conjunctivae normal.     Pupils: Pupils are equal, round, and reactive to light.  Cardiovascular:     Rate and Rhythm: Normal rate.  Pulmonary:     Effort: Pulmonary effort is normal. No respiratory distress.  Abdominal:     General: There is no distension.     Palpations: Abdomen is soft.  Musculoskeletal:        General: Normal  range of motion.     Cervical back: Normal range of motion.       Legs:  Skin:    General: Skin is warm and dry.  Neurological:  Mental Status: She is alert.      UC Treatments / Results  Labs (all labs ordered are listed, but only abnormal results are displayed) Labs Reviewed - No data to display  EKG   Radiology No results found.  Procedures Incision and Drainage  Date/Time: 01/27/2020 3:43 PM Performed by: Eustace Moore, MD Authorized by: Eustace Moore, MD   Consent:    Consent obtained:  Verbal   Consent given by:  Patient   Risks discussed:  Incomplete drainage   Alternatives discussed:  No treatment Location:    Type:  Abscess   Location:  Anogenital   Anogenital location:  Pilonidal Pre-procedure details:    Skin preparation:  Betadine Anesthesia (see MAR for exact dosages):    Anesthesia method:  Local infiltration   Local anesthetic:  Lidocaine 1% WITH epi Procedure details:    Needle aspiration: no     Incision types:  Stab incision   Incision depth:  Subcutaneous   Scalpel blade:  11   Wound management:  Probed and deloculated   Drainage:  Purulent   Drainage amount:  Copious   Wound treatment:  Drain placed   Packing materials:  1/2 in gauze   Amount 1/2":  1 Post-procedure details:    Patient tolerance of procedure:  Tolerated well, no immediate complications Comments:     Copious purulent malodorous drainage   (including critical care time)  Medications Ordered in UC Medications - No data to display  Initial Impression / Assessment and Plan / UC Course  I have reviewed the triage vital signs and the nursing notes.  Pertinent labs & imaging results that were available during my care of the patient were reviewed by me and considered in my medical decision making (see chart for details).     *Follow-up care discussed Final Clinical Impressions(s) / UC Diagnoses   Final diagnoses:  Abscess     Discharge Instructions      Warm compresses or warm soaks to area The gauze in the opening will fall out in a day or 2 Keep the dressing on as long as it is draining Change antibiotics from doxycycline to Augmentin 1 twice a day I have prescribed Diflucan in case you needed Return as needed    ED Prescriptions    Medication Sig Dispense Auth. Provider   amoxicillin-clavulanate (AUGMENTIN) 875-125 MG tablet Take 1 tablet by mouth every 12 (twelve) hours. 14 tablet Eustace Moore, MD   fluconazole (DIFLUCAN) 150 MG tablet Take 1 tablet (150 mg total) by mouth daily. Repeat in 1 week if needed 2 tablet Eustace Moore, MD   HYDROcodone-acetaminophen (NORCO/VICODIN) 5-325 MG tablet Take 1-2 tablets by mouth every 6 (six) hours as needed. 10 tablet Eustace Moore, MD     I have reviewed the PDMP during this encounter.   Eustace Moore, MD 01/27/20 [phone]

## 2020-01-28 ENCOUNTER — Ambulatory Visit: Payer: Self-pay

## 2020-02-04 DIAGNOSIS — L638 Other alopecia areata: Secondary | ICD-10-CM | POA: Diagnosis not present

## 2020-02-04 DIAGNOSIS — L239 Allergic contact dermatitis, unspecified cause: Secondary | ICD-10-CM | POA: Diagnosis not present

## 2020-02-07 DIAGNOSIS — F331 Major depressive disorder, recurrent, moderate: Secondary | ICD-10-CM | POA: Diagnosis not present

## 2020-02-13 ENCOUNTER — Other Ambulatory Visit (HOSPITAL_COMMUNITY): Payer: Self-pay | Admitting: Psychiatry

## 2020-02-13 DIAGNOSIS — G4700 Insomnia, unspecified: Secondary | ICD-10-CM

## 2020-02-15 DIAGNOSIS — F331 Major depressive disorder, recurrent, moderate: Secondary | ICD-10-CM | POA: Diagnosis not present

## 2020-02-21 DIAGNOSIS — M545 Low back pain: Secondary | ICD-10-CM | POA: Diagnosis not present

## 2020-02-21 DIAGNOSIS — Z1159 Encounter for screening for other viral diseases: Secondary | ICD-10-CM | POA: Diagnosis not present

## 2020-02-21 DIAGNOSIS — R102 Pelvic and perineal pain: Secondary | ICD-10-CM | POA: Diagnosis not present

## 2020-02-21 DIAGNOSIS — Z113 Encounter for screening for infections with a predominantly sexual mode of transmission: Secondary | ICD-10-CM | POA: Diagnosis not present

## 2020-02-28 DIAGNOSIS — F331 Major depressive disorder, recurrent, moderate: Secondary | ICD-10-CM | POA: Diagnosis not present

## 2020-03-05 ENCOUNTER — Other Ambulatory Visit: Payer: Self-pay

## 2020-03-05 ENCOUNTER — Telehealth (HOSPITAL_COMMUNITY): Payer: Federal, State, Local not specified - PPO | Admitting: Psychiatry

## 2020-03-05 NOTE — Progress Notes (Unsigned)
"  doing alright. Nothing  Still working at [employer]- she took a leave of absence from school. She is ok with it but it was a hard decision. Jamie Lopez is not sure when she is going to go back.   Still in therapy to deal with passing of her father. She is learning coping skills and use even she can. Jamie Lopez is worried about worsening of symptoms because it's the 1st holiday and birthday since he passed.   Sleep is good. She takes Vistaril 2 tabs about 2x/week. She takes melatonin 4x/week  Depression is slowly improving and is manageable. The increase in dose had helped. She sometimes feels hopeless that the depression will never get better. She does note that she is noticing some improvement in that feeling. She denies SI/HI.   Daily anxiety journal is helping to focus on good days. Anxiety is mostly manageable but bad when she has a very stressful day at work.     p: refill meds Continue therapy  Nov 18 @ 3.30pm   (20 min)

## 2020-03-19 DIAGNOSIS — B0089 Other herpesviral infection: Secondary | ICD-10-CM | POA: Diagnosis not present

## 2020-03-19 DIAGNOSIS — L638 Other alopecia areata: Secondary | ICD-10-CM | POA: Diagnosis not present

## 2020-03-23 DIAGNOSIS — Z20822 Contact with and (suspected) exposure to covid-19: Secondary | ICD-10-CM | POA: Diagnosis not present

## 2020-03-27 DIAGNOSIS — F331 Major depressive disorder, recurrent, moderate: Secondary | ICD-10-CM | POA: Diagnosis not present

## 2020-04-05 ENCOUNTER — Emergency Department (HOSPITAL_COMMUNITY): Payer: Federal, State, Local not specified - PPO

## 2020-04-05 ENCOUNTER — Emergency Department (HOSPITAL_COMMUNITY)
Admission: EM | Admit: 2020-04-05 | Discharge: 2020-04-05 | Disposition: A | Discharge status: Home / Self Care | Payer: Federal, State, Local not specified - PPO | Attending: Emergency Medicine | Admitting: Emergency Medicine

## 2020-04-05 ENCOUNTER — Other Ambulatory Visit: Payer: Self-pay

## 2020-04-05 ENCOUNTER — Encounter (HOSPITAL_COMMUNITY): Payer: Self-pay | Admitting: *Deleted

## 2020-04-05 DIAGNOSIS — M25562 Pain in left knee: Secondary | ICD-10-CM | POA: Diagnosis not present

## 2020-04-05 DIAGNOSIS — J45909 Unspecified asthma, uncomplicated: Secondary | ICD-10-CM | POA: Diagnosis not present

## 2020-04-05 DIAGNOSIS — F17210 Nicotine dependence, cigarettes, uncomplicated: Secondary | ICD-10-CM | POA: Insufficient documentation

## 2020-04-05 DIAGNOSIS — Z91010 Allergy to peanuts: Secondary | ICD-10-CM | POA: Insufficient documentation

## 2020-04-05 MED ORDER — KETOROLAC TROMETHAMINE 60 MG/2ML IM SOLN
60.0000 mg | Freq: Once | INTRAMUSCULAR | Status: AC
  Administered 2020-04-05: 60 mg via INTRAMUSCULAR
  Filled 2020-04-05: qty 2

## 2020-04-05 NOTE — Discharge Instructions (Signed)
Please take ibuprofen as needed for pain.  You may take up to 800 mg 3 times daily for approximately a week.  Use the knee sleeve as needed.  Make sure to elevate your leg, apply ice or heat depending on what helps more.  I have provided an orthopedic follow-up if needed.  Return to the ER for any new or worsening symptoms.

## 2020-04-05 NOTE — ED Provider Notes (Signed)
[facility]-EMERGENCY DEPT Provider Note   CSN: [id number] Arrival date & time: 04/05/20  1138     History Chief Complaint  Patient presents with  . Knee Pain    Jamie Lopez is a 24 y.o. female.  HPI 24 year old female with a history of anxiety, asthma, headaches presents to the ER with complaints of 2 days of left knee pain.  Patient states that she first noted the pain when she was lying in bed laying on her right side.  She then states that she had to turn over on the other side to prevent the pain.  She states that the pain has been pretty consistent since then.  She felt like she noticed some swelling to the left lateral area of the knee and wanted to come be evaluated.  She denies any numbness or tingling.  Has been able to ambulate but with pain.  She has not taken anything for her symptoms.  Denies any history of IV drug use, no fevers or chills, has not noticed any warmth or redness to the joint.  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  She also states that she has a chronic problem of her fingers and toes and joint swelling up randomly.  She states she plans to follow-up with her PCP and be evaluated for rheumatoid arthritis as this does run in the family.    Past Medical History:  Diagnosis Date  . Anxiety   . Asthma   . Headache(784.0)   . Rosacea   . Seasonal allergies     Patient Active Problem List   Diagnosis Date Noted  . GAD (generalized anxiety disorder) 10/25/2012  . Migraines 10/25/2012  . Migraine 05/28/2011    Past Surgical History:  Procedure Laterality Date  . NO PAST SURGERIES    . WISDOM TOOTH EXTRACTION       OB History   No obstetric history on file.     Family History  Problem Relation Age of Onset  . Anxiety disorder Mother   . Diabetes Mellitus II Mother   . Depression Mother   . Anxiety disorder Maternal Grandfather   . Depression Maternal Grandfather   . Alcohol abuse  Maternal Grandfather   . Drug abuse Maternal Grandfather   . Anxiety disorder Maternal Grandmother   . Diabetes Mellitus II Maternal Grandmother   . Depression Maternal Grandmother   . Alcohol abuse Paternal Grandfather   . Drug abuse Paternal Grandfather   . Bipolar disorder Cousin   . Schizophrenia Cousin   . Drug abuse Father     Social History   Tobacco Use  . Smoking status: Light Tobacco Smoker    Types: Cigarettes, Cigars    Last attempt to quit: 05/11/2013    Years since quitting: 6.9  . Smokeless tobacco: Never Used  . Tobacco comment: once a year  Vaping Use  . Vaping Use: Never used  Substance Use Topics  . Alcohol use: Yes    Alcohol/week: 2.0 standard drinks    Types: 2 Glasses of wine per week    Comment: social: 1-2x/week  . Drug use: Not Currently    Types: Marijuana    Comment: quit in 2018    Home Medications Prior to Admission medications   Medication Sig Start Date End Date Taking? Authorizing Provider  albuterol (PROVENTIL HFA;VENTOLIN HFA) 108 (90 Base) MCG/ACT inhaler Inhale 1-2 puffs into the lungs every 6 (six)  hours as needed for wheezing or shortness of breath. 09/29/17   Law, Waylan Boga, PA-C  amoxicillin-clavulanate (AUGMENTIN) 875-125 MG tablet Take 1 tablet by mouth every 12 (twelve) hours. 01/27/20   Eustace Moore, MD  budesonide (RHINOCORT ALLERGY) 32 MCG/ACT nasal spray Place 1-2 sprays into both nostrils daily. 08/25/18   Shade Flood, MD  cetirizine (ZYRTEC) 10 MG tablet Take 1 tablet (10 mg total) by mouth daily. 08/25/18   Shade Flood, MD  etonogestrel-ethinyl estradiol (NUVARING) 0.12-0.015 MG/24HR vaginal ring Place 1 each vaginally every 28 (twenty-eight) days. Insert vaginally and leave in place for 3 consecutive weeks, then remove for 1 week.    [provider]  fluconazole (DIFLUCAN) 150 MG tablet Take 1 tablet (150 mg total) by mouth daily. Repeat in 1 week if needed 01/27/20   Eustace Moore, MD   HYDROcodone-acetaminophen (NORCO/VICODIN) 5-325 MG tablet Take 1-2 tablets by mouth every 6 (six) hours as needed. 01/27/20   Eustace Moore, MD  hydrOXYzine (VISTARIL) 25 MG capsule TAKE 1 TO 2 CAPSULES BY MOUTH EVERY DAY AT BEDTIME AS NEEDED FOR INSOMNIA 02/17/20   Oletta Darter, MD  naproxen (NAPROSYN) 500 MG tablet Take 1 tablet (500 mg total) by mouth 2 (two) times daily. 06/01/18   Kendrick, Caitlyn S, PA-C  Olopatadine HCl (PATADAY) 0.2 % SOLN Apply 1 drop to eye daily as needed. 08/25/18   Shade Flood, MD  PRESCRIPTION MEDICATION     [provider]  propranolol (INDERAL) 10 MG tablet Take 1 tablet (10 mg total) by mouth 2 (two) times daily. 01/16/20   Oletta Darter, MD  rizatriptan (MAXALT-MLT) 10 MG disintegrating tablet Take 10 mg by mouth as needed for migraine. May repeat in 2 hours if needed    [provider]  sertraline (ZOLOFT) 100 MG tablet Take 1.5 tablets (150 mg total) by mouth daily. 01/16/20   Oletta Darter, MD  triamcinolone cream (KENALOG) 0.1 % Apply 1 application topically 2 (two) times daily. 01/02/19   Janeece Agee, NP    Allergies    Fruit & vegetable daily [nutritional supplements], Peanut-containing drug products, and Other  Review of Systems   Review of Systems  Constitutional: Negative for chills and fever.  Musculoskeletal: Positive for arthralgias and joint swelling.  Skin: Negative for color change.    Physical Exam Updated Vital Signs BP 131/83 (BP Location: Right Arm)   Pulse 86   Temp 98.8 F (37.1 C) (Oral)   Resp 18   LMP 03/16/2020   SpO2 100%   Physical Exam Vitals and nursing note reviewed.  Constitutional:      General: She is not in acute distress.    Appearance: She is well-developed.  HENT:     Head: Normocephalic and atraumatic.     Mouth/Throat:     Pharynx: No oropharyngeal exudate or posterior oropharyngeal erythema.  Eyes:     Conjunctiva/sclera: Conjunctivae normal.  Cardiovascular:      Rate and Rhythm: Normal rate and regular rhythm.     Heart sounds: No murmur heard.   Pulmonary:     Effort: Pulmonary effort is normal. No respiratory distress.     Breath sounds: Normal breath sounds.  Abdominal:     Palpations: Abdomen is soft.     Tenderness: There is no abdominal tenderness.  Musculoskeletal:        General: Swelling and tenderness present. No deformity or signs of injury.     Cervical back: Neck supple.  Right lower leg: No edema.     Left lower leg: No edema.       Legs:     Comments: Tenderness to palpation to the left lateral upper quadrant of the knee.  No visible erythema, warmth, possible mild swelling but nothing significant.  She has full flexion and extension of the knee joint.  She has no tibial plateau tenderness.  Skin:    General: Skin is warm and dry.     Findings: No bruising.  Neurological:     General: No focal deficit present.     Mental Status: She is alert and oriented to person, place, and time.     Sensory: No sensory deficit.     Motor: No weakness.      ED Results / Procedures / Treatments   Labs (all labs ordered are listed, but only abnormal results are displayed) Labs Reviewed - No data to display  EKG None  Radiology DG Knee Complete 4 Views Left  Result Date: 04/05/2020 CLINICAL DATA:  LEFT knee pain for 2 days with flexion and rotation, patellar tenderness, no known injury EXAM: LEFT KNEE - COMPLETE 4+ VIEW COMPARISON:  None FINDINGS: Osseous mineralization normal. Joint spaces preserved. No fracture, dislocation, or bone destruction. No joint effusion. IMPRESSION: Normal exam. Electronically Signed   By: Ulyses Southward M.D.   On: 04/05/2020 12:37    Procedures Procedures (including critical care time)  Medications Ordered in ED Medications  ketorolac (TORADOL) injection 60 mg (has no administration in time range)    ED Course  I have reviewed the triage vital signs and the nursing notes.  Pertinent labs &  imaging results that were available during my care of the patient were reviewed by me and considered in my medical decision making (see chart for details).    MDM Rules/Calculators/A&P                          24 year old female with complaints of left knee pain x2 days. Exam and vitals nonconcerning for septic joint, gout, concern for tibial plateau fracture.  She was able to ambulate here in the ED.  Encouraged NSAIDs, will provide knee sleeve, shot of Toradol here, rice, Ortho follow-up will be provided.  Patient plans to be evaluated for rheumatoid arthritis.  Return precautions discussed.  She was understanding and is agreeable. Final Clinical Impression(s) / ED Diagnoses Final diagnoses:  Acute pain of left knee    Rx / DC Orders ED Discharge Orders    None       Leone Brand 04/05/20 1320    Pollyann Savoy, MD 04/05/20 1436

## 2020-04-05 NOTE — ED Triage Notes (Signed)
Pt complains of left knee pain and swelling, became worse over the past couple of days. She denies injury, She noticed pain when she turned in bed. Worse with knee rotation and flexion.

## 2020-04-16 ENCOUNTER — Other Ambulatory Visit: Payer: Self-pay

## 2020-04-16 ENCOUNTER — Telehealth (HOSPITAL_COMMUNITY): Payer: Federal, State, Local not specified - PPO | Admitting: Psychiatry

## 2020-04-16 DIAGNOSIS — G4700 Insomnia, unspecified: Secondary | ICD-10-CM

## 2020-04-16 DIAGNOSIS — F411 Generalized anxiety disorder: Secondary | ICD-10-CM

## 2020-04-16 DIAGNOSIS — F331 Major depressive disorder, recurrent, moderate: Secondary | ICD-10-CM

## 2020-04-16 NOTE — Progress Notes (Unsigned)
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 Chance is good but it does make her anxious. Donalyn has on/off depression. She feels lonely but keeping busy helps. Sleep is fair with Vistaril. Since our last visit she has 2-3 periods of feeling hopeless for about 3 days. Adabella states that during those times she questioned her purpose. She denies SI/HI.   Looking for a new job. She may have secured one but waiting to hear back from them.  Still out of school  Clarrisa increased Zoloft to 200mg  about 3 weeks ago and it helps.   Feb 17 at 4pm  Increase Zoloft  Refill meds

## 2020-04-17 DIAGNOSIS — F331 Major depressive disorder, recurrent, moderate: Secondary | ICD-10-CM | POA: Diagnosis not present

## 2020-04-17 MED ORDER — SERTRALINE HCL 100 MG PO TABS: 200.0000 mg | ORAL_TABLET | Freq: Every day | ORAL | 0 refills | Status: DC

## 2020-04-17 MED ORDER — HYDROXYZINE PAMOATE 25 MG PO CAPS: ORAL_CAPSULE | ORAL | 0 refills | Status: DC

## 2020-04-17 MED ORDER — PROPRANOLOL HCL 10 MG PO TABS: 10.0000 mg | ORAL_TABLET | Freq: Two times a day (BID) | ORAL | 0 refills | Status: DC

## 2020-05-06 DIAGNOSIS — M25562 Pain in left knee: Secondary | ICD-10-CM | POA: Diagnosis not present

## 2020-05-07 DIAGNOSIS — F331 Major depressive disorder, recurrent, moderate: Secondary | ICD-10-CM | POA: Diagnosis not present

## 2020-05-26 DIAGNOSIS — Z20828 Contact with and (suspected) exposure to other viral communicable diseases: Secondary | ICD-10-CM | POA: Diagnosis not present

## 2020-06-06 DIAGNOSIS — F331 Major depressive disorder, recurrent, moderate: Secondary | ICD-10-CM | POA: Diagnosis not present

## 2020-06-12 DIAGNOSIS — R768 Other specified abnormal immunological findings in serum: Secondary | ICD-10-CM | POA: Diagnosis not present

## 2020-06-12 DIAGNOSIS — M2550 Pain in unspecified joint: Secondary | ICD-10-CM | POA: Diagnosis not present

## 2020-06-16 DIAGNOSIS — R768 Other specified abnormal immunological findings in serum: Secondary | ICD-10-CM | POA: Diagnosis not present

## 2020-06-16 DIAGNOSIS — M2550 Pain in unspecified joint: Secondary | ICD-10-CM | POA: Diagnosis not present

## 2020-07-08 ENCOUNTER — Ambulatory Visit: Payer: Federal, State, Local not specified - PPO | Admitting: Registered Nurse

## 2020-07-08 ENCOUNTER — Encounter: Payer: Self-pay | Admitting: Registered Nurse

## 2020-07-08 ENCOUNTER — Other Ambulatory Visit (HOSPITAL_COMMUNITY)
Admission: RE | Admit: 2020-07-08 | Discharge: 2020-07-08 | Disposition: A | Discharge status: Home / Self Care | Payer: Federal, State, Local not specified - PPO | Source: Ambulatory Visit | Attending: Registered Nurse | Admitting: Registered Nurse

## 2020-07-08 ENCOUNTER — Telehealth: Payer: Self-pay | Admitting: Registered Nurse

## 2020-07-08 ENCOUNTER — Other Ambulatory Visit: Payer: Self-pay

## 2020-07-08 VITALS — BP 110/78 | HR 90 | Temp 98.0°F | Resp 18 | Ht 62.0 in | Wt 183.8 lb

## 2020-07-08 DIAGNOSIS — M329 Systemic lupus erythematosus, unspecified: Secondary | ICD-10-CM

## 2020-07-08 DIAGNOSIS — Z8639 Personal history of other endocrine, nutritional and metabolic disease: Secondary | ICD-10-CM | POA: Diagnosis not present

## 2020-07-08 DIAGNOSIS — Z113 Encounter for screening for infections with a predominantly sexual mode of transmission: Secondary | ICD-10-CM | POA: Diagnosis not present

## 2020-07-08 DIAGNOSIS — N898 Other specified noninflammatory disorders of vagina: Secondary | ICD-10-CM

## 2020-07-08 NOTE — Patient Instructions (Signed)
° ° ° °  If you have lab work done today you will be contacted with your lab results within the next 2 weeks.  If you have not heard from us then please contact us. The fastest way to get your results is to register for My Chart. ° ° °IF you received an x-ray today, you will receive an invoice from [facility]. Please contact [facility] at [phone] with questions or concerns regarding your invoice.  ° °IF you received labwork today, you will receive an invoice from [facility]. Please contact [facility] at [phone] with questions or concerns regarding your invoice.  ° °Our billing staff will not be able to assist you with questions regarding bills from these companies. ° °You will be contacted with the lab results as soon as they are available. The fastest way to get your results is to activate your My Chart account. Instructions are located on the last page of this paperwork. If you have not heard from us regarding the results in 2 weeks, please contact this office. °  ° ° ° °

## 2020-07-08 NOTE — Telephone Encounter (Signed)
Patient stated provider and nurse told her she would receive a call from them this morning at about 10:30 with results. Patient following up - hasn't received call. Patient also wants to know if prescriptions still needed.  Please advise at [phone].

## 2020-07-08 NOTE — Progress Notes (Signed)
Established Patient Office Visit  Subjective:  Patient ID: Jamie Lopez, female    DOB: Sep 01, 1995  Age: 25 y.o. MRN: [id number]  CC:  Chief Complaint  Patient presents with   Lupus    Patient states she has  has been dx with lupus on 06/12/2020 and would like to discuss medications. And also get STD screening.    HPI Jamie Lopez presents for review of chronic conditions and STI testing.  Vaginal odor Ongoing 2-3 weeks No discharge or rash No pelvic pain or aub No known exposure, in monogamous partnership No recent tx for STI No urinary symptoms Denies fevers, chills, fatigue, shob doe  SLE Dx in mid January Had a fall in December at work - [employer] recommended she be assessed by ortho given ongoing knee pain. Ortho started some basic labs that led to full workup for rheumatological processes with [facility] associates, who gave her dx of SLE Has started on plaquenil and is taking low dose prednisone daily Pain improving She does note now that she has had a few symptoms in the past year or two that have been suspicious for lupus, but given her other allergies and circumstances, had dismissed them at the time: malar rash, sun sensitivity, joint pain, abdominal pain, dry eyes.  Otherwise feeling well overall. Continues to see behavioral health and counseling to address her mental health concerns, which she feels have generally been well managed, but she does struggle at times with SLE dx.  Past Medical History:  Diagnosis Date   Anxiety    Asthma    Headache(784.0)    Lupus (HCC)    Rosacea    Seasonal allergies     Past Surgical History:  Procedure Laterality Date   NO PAST SURGERIES     WISDOM TOOTH EXTRACTION      Family History  Problem Relation Age of Onset   Anxiety disorder Mother    Diabetes Mellitus II Mother    Depression Mother    Anxiety disorder Maternal Grandfather    Depression Maternal Grandfather    Alcohol abuse  Maternal Grandfather    Drug abuse Maternal Grandfather    Anxiety disorder Maternal Grandmother    Diabetes Mellitus II Maternal Grandmother    Depression Maternal Grandmother    Alcohol abuse Paternal Grandfather    Drug abuse Paternal Grandfather    Bipolar disorder Cousin    Schizophrenia Cousin    Drug abuse Father     Social History   Socioeconomic History   Marital status: Single    Spouse name: Not on file   Number of children: 0   Years of education: 14   Highest education level: Not on file  Occupational History   Occupation: student    Comment: [employer]  Tobacco Use   Smoking status: Light Tobacco Smoker    Types: Cigarettes, Cigars    Last attempt to quit: 05/11/2013    Years since quitting: 7.1   Smokeless tobacco: Never Used   Tobacco comment: once a year  Vaping Use   Vaping Use: Never used  Substance and Sexual Activity   Alcohol use: Yes    Alcohol/week: 2.0 standard drinks    Types: 2 Glasses of wine per week    Comment: social: 1-2x/week   Drug use: Not Currently    Types: Marijuana    Comment: quit in 2018   Sexual activity: Yes    Partners: Male    Birth control/protection: Condom  Other Topics Concern  Not on file  Social History Narrative   Pt lives in [place] with mom and brother. Pt goes to [employer] and pt is a sophomore. Pt was born in [place] and raised by mom. Pt has 4 siblings and 1 on the way on her dad's side. Childhood was nice and she mostly raised as an child.  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      Social Determinants of Health   Financial Resource Strain: Not on file  Food Insecurity: Not on file  Transportation Needs: Not on file  Physical Activity: Not on file  Stress: Not on file  Social Connections: Not on file  Intimate Partner Violence: Not on file    Outpatient Medications Prior to Visit  Medication Sig Dispense Refill   albuterol (PROVENTIL HFA;VENTOLIN HFA) 108 (90  Base) MCG/ACT inhaler Inhale 1-2 puffs into the lungs every 6 (six) hours as needed for wheezing or shortness of breath. 1 Inhaler 0   amoxicillin-clavulanate (AUGMENTIN) 875-125 MG tablet Take 1 tablet by mouth every 12 (twelve) hours. 14 tablet 0   budesonide (RHINOCORT ALLERGY) 32 MCG/ACT nasal spray Place 1-2 sprays into both nostrils daily. 8.6 g 6   cetirizine (ZYRTEC) 10 MG tablet Take 1 tablet (10 mg total) by mouth daily. 90 tablet 3   etonogestrel-ethinyl estradiol (NUVARING) 0.12-0.015 MG/24HR vaginal ring Place 1 each vaginally every 28 (twenty-eight) days. Insert vaginally and leave in place for 3 consecutive weeks, then remove for 1 week.     fluconazole (DIFLUCAN) 150 MG tablet Take 1 tablet (150 mg total) by mouth daily. Repeat in 1 week if needed 2 tablet 0   HYDROcodone-acetaminophen (NORCO/VICODIN) 5-325 MG tablet Take 1-2 tablets by mouth every 6 (six) hours as needed. 10 tablet 0   hydrOXYzine (VISTARIL) 25 MG capsule TAKE 1 TO 2 CAPSULES BY MOUTH EVERY DAY AT BEDTIME AS NEEDED FOR INSOMNIA 180 capsule 0   naproxen (NAPROSYN) 500 MG tablet Take 1 tablet (500 mg total) by mouth 2 (two) times daily. 30 tablet 0   Olopatadine HCl (PATADAY) 0.2 % SOLN Apply 1 drop to eye daily as needed. 2.5 mL 3   PRESCRIPTION MEDICATION      propranolol (INDERAL) 10 MG tablet Take 1 tablet (10 mg total) by mouth 2 (two) times daily. 180 tablet 0   rizatriptan (MAXALT-MLT) 10 MG disintegrating tablet Take 10 mg by mouth as needed for migraine. May repeat in 2 hours if needed     sertraline (ZOLOFT) 100 MG tablet Take 2 tablets (200 mg total) by mouth daily. 180 tablet 0   triamcinolone cream (KENALOG) 0.1 % Apply 1 application topically 2 (two) times daily. 30 g 0   No facility-administered medications prior to visit.    Allergies  Allergen Reactions   Fruit & Vegetable Daily [Nutritional Supplements]    Peanut-Containing Drug Products     Tree nut specifically    Other      ROS Review of Systems  Constitutional: Negative.   HENT: Negative.   Eyes: Negative.   Respiratory: Negative.   Cardiovascular: Negative.   Gastrointestinal: Negative.   Genitourinary: Negative.        Odor  Musculoskeletal: Positive for arthralgias.  Skin: Negative.   Neurological: Negative.   Hematological: Negative.   Psychiatric/Behavioral: Negative.       Objective:    Physical Exam Vitals and nursing note reviewed.  Constitutional:      General: She is not in acute distress.  Appearance: Normal appearance. She is normal weight. She is not ill-appearing, toxic-appearing or diaphoretic.  Cardiovascular:     Rate and Rhythm: Normal rate and regular rhythm.     Heart sounds: Normal heart sounds. No murmur heard. No friction rub. No gallop.   Pulmonary:     Effort: Pulmonary effort is normal. No respiratory distress.     Breath sounds: Normal breath sounds. No stridor. No wheezing, rhonchi or rales.  Chest:     Chest wall: No tenderness.  Skin:    General: Skin is warm and dry.  Neurological:     General: No focal deficit present.     Mental Status: She is alert and oriented to person, place, and time. Mental status is at baseline.  Psychiatric:        Mood and Affect: Mood normal.        Behavior: Behavior normal.        Thought Content: Thought content normal.        Judgment: Judgment normal.     BP 110/78    Pulse 90    Temp 98 F (36.7 C) (Temporal)    Resp 18    Ht 5\' 2"  (1.575 m)    Wt 183 lb 12.8 oz (83.4 kg)    SpO2 100%    BMI 33.62 kg/m  Wt Readings from Last 3 Encounters:  07/08/20 183 lb 12.8 oz (83.4 kg)  12/23/19 185 lb 6.4 oz (84.1 kg)  01/02/19 175 lb (79.4 kg)     Health Maintenance Due  Topic Date Due   INFLUENZA VACCINE  12/29/2019    There are no preventive care reminders to display for this patient.  Lab Results  Component Value Date   TSH 0.682 12/23/2019   Lab Results  Component Value Date   WBC 4.5 12/23/2019    HGB 11.9 12/23/2019   HCT 36.5 12/23/2019   MCV 88 12/23/2019   PLT 354 12/23/2019   Lab Results  Component Value Date   NA 140 12/23/2019   K 4.1 12/23/2019   CO2 20 12/23/2019   GLUCOSE 110 (H) 12/23/2019   BUN 8 12/23/2019   CREATININE 0.60 12/23/2019   BILITOT <0.2 12/23/2019   ALKPHOS 78 12/23/2019   AST 25 12/23/2019   ALT 27 12/23/2019   PROT 8.0 12/23/2019   ALBUMIN 4.1 12/23/2019   CALCIUM 8.9 12/23/2019   ANIONGAP 8 05/31/2018   Lab Results  Component Value Date   CHOL 157 12/23/2019   Lab Results  Component Value Date   HDL 48 12/23/2019   Lab Results  Component Value Date   LDLCALC 95 12/23/2019   Lab Results  Component Value Date   TRIG 70 12/23/2019   Lab Results  Component Value Date   CHOLHDL 3.3 12/23/2019   Lab Results  Component Value Date   HGBA1C 5.6 12/23/2019      Assessment & Plan:   Problem List Items Addressed This Visit   None   Visit Diagnoses    Routine screening for STI (sexually transmitted infection)    -  Primary   Relevant Orders   HIV antibody (with reflex)   RPR   Urine cytology ancillary only   Lupus (HCC)       Relevant Orders   Vitamin D, 25-hydroxy   Vitamin B12   Magnesium   Comprehensive metabolic panel   CBC   Vaginal odor       Relevant Orders   RPR   Urine  cytology ancillary only   POCT wet + KOH prep   History of vitamin D deficiency       Relevant Orders   Vitamin D, 25-hydroxy   Vitamin B12   Magnesium      No orders of the defined types were placed in this encounter.   Follow-up: No follow-ups on file.   PLAN  Discussed sle general course and prognosis  Discussed labs to monitor and symptoms to watch out for - pt is very aware of these, high level of health literacy  Labs collected. Will follow up with the patient as warranted.  Patient encouraged to call clinic with any questions, comments, or concerns.  Janeece Agee, NP

## 2020-07-09 LAB — COMPREHENSIVE METABOLIC PANEL
ALT: 14 IU/L (ref 0–32)
AST: 17 IU/L (ref 0–40)
Albumin/Globulin Ratio: 1 — ABNORMAL LOW (ref 1.2–2.2)
Albumin: 3.7 g/dL — ABNORMAL LOW (ref 3.9–5.0)
Alkaline Phosphatase: 53 IU/L (ref 44–121)
BUN/Creatinine Ratio: 15 (ref 9–23)
BUN: 11 mg/dL (ref 6–20)
Bilirubin Total: 0.3 mg/dL (ref 0.0–1.2)
CO2: 16 mmol/L — ABNORMAL LOW (ref 20–29)
Calcium: 8.7 mg/dL (ref 8.7–10.2)
Chloride: 105 mmol/L (ref 96–106)
Creatinine, Ser: 0.73 mg/dL (ref 0.57–1.00)
GFR calc Af Amer: 132 mL/min/{1.73_m2} (ref >59)
GFR calc non Af Amer: 115 mL/min/{1.73_m2} (ref >59)
Globulin, Total: 3.8 g/dL (ref 1.5–4.5)
Glucose: 84 mg/dL (ref 65–99)
Potassium: 4.2 mmol/L (ref 3.5–5.2)
Sodium: 135 mmol/L (ref 134–144)
Total Protein: 7.5 g/dL (ref 6.0–8.5)

## 2020-07-09 LAB — CBC
Hematocrit: 33.1 % — ABNORMAL LOW (ref 34.0–46.6)
Hemoglobin: 11 g/dL — ABNORMAL LOW (ref 11.1–15.9)
MCH: 29.2 pg (ref 26.6–33.0)
MCHC: 33.2 g/dL (ref 31.5–35.7)
MCV: 88 fL (ref 79–97)
Platelets: 350 10*3/uL (ref 150–450)
RBC: 3.77 x10E6/uL (ref 3.77–5.28)
RDW: 13.3 % (ref 11.7–15.4)
WBC: 4.4 10*3/uL (ref 3.4–10.8)

## 2020-07-09 LAB — RPR: RPR Ser Ql: NONREACTIVE

## 2020-07-09 LAB — MAGNESIUM: Magnesium: 2 mg/dL (ref 1.6–2.3)

## 2020-07-09 LAB — URINE CYTOLOGY ANCILLARY ONLY
Chlamydia: NEGATIVE
Comment: NEGATIVE
Comment: NEGATIVE
Comment: NORMAL
Neisseria Gonorrhea: NEGATIVE
Trichomonas: NEGATIVE

## 2020-07-09 LAB — VITAMIN B12: Vitamin B-12: 352 pg/mL (ref 232–1245)

## 2020-07-09 LAB — HIV ANTIBODY (ROUTINE TESTING W REFLEX): HIV Screen 4th Generation wRfx: NONREACTIVE

## 2020-07-09 LAB — VITAMIN D 25 HYDROXY (VIT D DEFICIENCY, FRACTURES): Vit D, 25-Hydroxy: 11.5 ng/mL — ABNORMAL LOW (ref 30.0–100.0)

## 2020-07-10 NOTE — Telephone Encounter (Signed)
Lab results call? Pt asking for call

## 2020-07-14 ENCOUNTER — Encounter: Payer: Self-pay | Admitting: Registered Nurse

## 2020-07-14 ENCOUNTER — Other Ambulatory Visit: Payer: Self-pay | Admitting: Registered Nurse

## 2020-07-14 DIAGNOSIS — E559 Vitamin D deficiency, unspecified: Secondary | ICD-10-CM

## 2020-07-14 MED ORDER — VITAMIN D (ERGOCALCIFEROL) 1.25 MG (50000 UNIT) PO CAPS: 50000.0000 [IU] | ORAL_CAPSULE | ORAL | 0 refills | Status: DC

## 2020-07-15 NOTE — Telephone Encounter (Signed)
Patient is wondering was naproxen sent in as well as vitamin D

## 2020-07-16 ENCOUNTER — Other Ambulatory Visit: Payer: Self-pay

## 2020-07-16 ENCOUNTER — Telehealth (INDEPENDENT_AMBULATORY_CARE_PROVIDER_SITE_OTHER): Payer: Federal, State, Local not specified - PPO | Admitting: Psychiatry

## 2020-07-16 DIAGNOSIS — G4700 Insomnia, unspecified: Secondary | ICD-10-CM | POA: Diagnosis not present

## 2020-07-16 DIAGNOSIS — F331 Major depressive disorder, recurrent, moderate: Secondary | ICD-10-CM | POA: Diagnosis not present

## 2020-07-16 DIAGNOSIS — F411 Generalized anxiety disorder: Secondary | ICD-10-CM

## 2020-07-16 DIAGNOSIS — M329 Systemic lupus erythematosus, unspecified: Secondary | ICD-10-CM | POA: Insufficient documentation

## 2020-07-16 MED ORDER — BUSPIRONE HCL 5 MG PO TABS: 5.0000 mg | ORAL_TABLET | Freq: Two times a day (BID) | ORAL | 0 refills | Status: DC

## 2020-07-16 MED ORDER — PROPRANOLOL HCL 10 MG PO TABS: 10.0000 mg | ORAL_TABLET | Freq: Two times a day (BID) | ORAL | 0 refills | Status: DC

## 2020-07-16 MED ORDER — HYDROXYZINE PAMOATE 25 MG PO CAPS: ORAL_CAPSULE | ORAL | 0 refills | Status: DC

## 2020-07-16 MED ORDER — SERTRALINE HCL 100 MG PO TABS: 200.0000 mg | ORAL_TABLET | Freq: Every day | ORAL | 0 refills | Status: DC

## 2020-07-16 NOTE — Progress Notes (Signed)
Virtual Visit via Video Note  I connected with Mara Acevedo on 07/16/20 at  4:00 PM EST by a video enabled telemedicine application and verified that I am speaking with the correct person using two identifiers.  Location: Patient: work Provider: office   I discussed the limitations of evaluation and management by telemedicine and the availability of in person appointments. The patient expressed understanding and agreed to proceed.  History of Present Illness: Sheniya shares that she was recently diagnosed with Lupus. Her friend died of Lupus when she was a young child. Being diagnosed with Lupus now has brought on a lot of fear, anxiety and depression. She is trying to stay busy but is always thinking about it in the back of her head. Her depression is manageable but worse than before. She is sleeping ok but often wakes up 1-2x/night. Carlisa takes Vistaril about 1x/week. She denies SI/HI.    Observations/Objective: Psychiatric Specialty Exam: ROS  There were no vitals taken for this visit.There is no height or weight on file to calculate BMI.  General Appearance: Well Groomed  Eye Contact:  Good  Speech:  Clear and Coherent and Normal Rate  Volume:  Normal  Mood:  Anxious and Depressed  Affect:  Full Range  Thought Process:  Goal Directed, Linear and Descriptions of Associations: Intact  Orientation:  Full (Time, Place, and Person)  Thought Content:  Logical  Suicidal Thoughts:  No  Homicidal Thoughts:  No  Memory:  Immediate;   Good  Judgement:  Good  Insight:  Good  Psychomotor Activity:  Normal  Concentration:  Concentration: Good  Recall:  Good  Fund of Knowledge:  Good  Language:  Good  Akathisia:  No  Handed:  Right  AIMS (if indicated):     Assets:  Communication Skills Desire for Improvement Financial Resources/Insurance Housing Resilience Social Support Talents/Skills Transportation Vocational/Educational  ADL's:  Intact  Cognition:  WNL  Sleep:         Assessment and Plan: 1. GAD (generalized anxiety disorder) - propranolol (INDERAL) 10 MG tablet; Take 1 tablet (10 mg total) by mouth 2 (two) times daily.  Dispense: 180 tablet; Refill: 0 - sertraline (ZOLOFT) 100 MG tablet; Take 2 tablets (200 mg total) by mouth daily.  Dispense: 180 tablet; Refill: 0 - start busPIRone (BUSPAR) 5 MG tablet; Take 1 tablet (5 mg total) by mouth 2 (two) times daily.  Dispense: 180 tablet; Refill: 0  2. MDD (major depressive disorder), recurrent episode, moderate (HCC) - sertraline (ZOLOFT) 100 MG tablet; Take 2 tablets (200 mg total) by mouth daily.  Dispense: 180 tablet; Refill: 0  3. Insomnia - hydrOXYzine (VISTARIL) 25 MG capsule; TAKE 1 TO 2 CAPSULES BY MOUTH EVERY DAY AT BEDTIME AS NEEDED FOR INSOMNIA  Dispense: 180 capsule; Refill: 0  - Jenipher is restarting therapy next week  Follow Up Instructions: In 4-6 weeks or sooner if needed   I discussed the assessment and treatment plan with the patient. The patient was provided an opportunity to ask questions and all were answered. The patient agreed with the plan and demonstrated an understanding of the instructions.   The patient was advised to call back or seek an in-person evaluation if the symptoms worsen or if the condition fails to improve as anticipated.   Oletta Darter, MD

## 2020-07-18 DIAGNOSIS — F331 Major depressive disorder, recurrent, moderate: Secondary | ICD-10-CM | POA: Diagnosis not present

## 2020-08-02 DIAGNOSIS — F331 Major depressive disorder, recurrent, moderate: Secondary | ICD-10-CM | POA: Diagnosis not present

## 2020-08-11 DIAGNOSIS — M2550 Pain in unspecified joint: Secondary | ICD-10-CM | POA: Diagnosis not present

## 2020-08-11 DIAGNOSIS — M329 Systemic lupus erythematosus, unspecified: Secondary | ICD-10-CM | POA: Diagnosis not present

## 2020-08-11 DIAGNOSIS — L659 Nonscarring hair loss, unspecified: Secondary | ICD-10-CM | POA: Diagnosis not present

## 2020-08-11 DIAGNOSIS — R768 Other specified abnormal immunological findings in serum: Secondary | ICD-10-CM | POA: Diagnosis not present

## 2020-08-16 DIAGNOSIS — F331 Major depressive disorder, recurrent, moderate: Secondary | ICD-10-CM | POA: Diagnosis not present

## 2020-08-27 ENCOUNTER — Telehealth (HOSPITAL_BASED_OUTPATIENT_CLINIC_OR_DEPARTMENT_OTHER): Payer: Federal, State, Local not specified - PPO | Admitting: Psychiatry

## 2020-08-27 ENCOUNTER — Other Ambulatory Visit: Payer: Self-pay

## 2020-08-27 DIAGNOSIS — F5105 Insomnia due to other mental disorder: Secondary | ICD-10-CM

## 2020-08-27 DIAGNOSIS — F99 Mental disorder, not otherwise specified: Secondary | ICD-10-CM | POA: Diagnosis not present

## 2020-08-27 DIAGNOSIS — F331 Major depressive disorder, recurrent, moderate: Secondary | ICD-10-CM | POA: Diagnosis not present

## 2020-08-27 DIAGNOSIS — F411 Generalized anxiety disorder: Secondary | ICD-10-CM

## 2020-08-27 DIAGNOSIS — G4700 Insomnia, unspecified: Secondary | ICD-10-CM

## 2020-08-27 MED ORDER — BUSPIRONE HCL 15 MG PO TABS: 7.5000 mg | ORAL_TABLET | Freq: Two times a day (BID) | ORAL | 0 refills | Status: DC

## 2020-08-27 MED ORDER — SERTRALINE HCL 100 MG PO TABS: 200.0000 mg | ORAL_TABLET | Freq: Every day | ORAL | 0 refills | Status: DC

## 2020-08-27 MED ORDER — PROPRANOLOL HCL 10 MG PO TABS: 10.0000 mg | ORAL_TABLET | Freq: Two times a day (BID) | ORAL | 0 refills | Status: DC

## 2020-08-27 MED ORDER — HYDROXYZINE PAMOATE 25 MG PO CAPS: ORAL_CAPSULE | ORAL | 0 refills | Status: DC

## 2020-08-27 NOTE — Progress Notes (Signed)
Virtual Visit via Video Note  I connected with Jamie Lopez on 08/27/20 at  3:30 PM EDT by a video enabled telemedicine application and verified that I am speaking with the correct person using two identifiers.  Location: Patient: home Provider: office   I discussed the limitations of evaluation and management by telemedicine and the availability of in person appointments. The patient expressed understanding and agreed to proceed.  History of Present Illness: "I'm doing fine". She has been taking her meds consistently. Things have calmed down and she is not as stressed. Her anxiety is slowly improving. Certain things do trigger her anxiety such as a meeting or a deadline but the anxiety is manageable. It does not occur daily. Her sleep and appetite are fair. She rarely takes Vistaril.  Her depression comes and goes as she grieves. She has only felt down 1-2 days in the past 2 weeks. She denies anhedonia and isolation. She denies SI/HI. Sometimes she feels numb and just feels like she is here. Most days for a few hours she feels numb. It has been going on for 10 days. She notices it after work and thinks it may be due to being over whelmed by social interactions.    Observations/Objective: Psychiatric Specialty Exam: ROS  There were no vitals taken for this visit.There is no height or weight on file to calculate BMI.  General Appearance: Casual and Neat  Eye Contact:  Good  Speech:  Clear and Coherent and Normal Rate  Volume:  Normal  Mood:  Anxious and Depressed  Affect:  Full Range  Thought Process:  Goal Directed, Linear, and Descriptions of Associations: Intact  Orientation:  Full (Time, Place, and Person)  Thought Content:  Logical  Suicidal Thoughts:  No  Homicidal Thoughts:  No  Memory:  Immediate;   Good  Judgement:  Good  Insight:  Good  Psychomotor Activity:  Normal  Concentration:  Concentration: Good  Recall:  Good  Fund of Knowledge:  Good  Language:  Good   Akathisia:  No  Handed:  Right  AIMS (if indicated):     Assets:  Communication Skills Desire for Improvement Financial Resources/Insurance Housing Resilience Social Support Talents/Skills Transportation Vocational/Educational  ADL's:  Intact  Cognition:  WNL  Sleep:        Assessment and Plan:  Depression screen [facility] 2/9 08/27/2020 07/08/2020 12/23/2019 01/02/2019 08/25/2018  Decreased Interest 0 0 0 0 0  Down, Depressed, Hopeless 1 0 0 0 0  PHQ - 2 Score 1 0 0 0 0    Flowsheet Row Video Visit [facility] 08/27/2020 in [facility]  C-SSRS RISK CATEGORY No Risk       1. GAD (generalized anxiety disorder) - busPIRone (BUSPAR) 15 MG tablet; Take 0.5 tablets (7.5 mg total) by mouth 2 (two) times daily.  Dispense: 45 tablet; Refill: 0 - sertraline (ZOLOFT) 100 MG tablet; Take 2 tablets (200 mg total) by mouth daily.  Dispense: 180 tablet; Refill: 0 - propranolol (INDERAL) 10 MG tablet; Take 1 tablet (10 mg total) by mouth 2 (two) times daily.  Dispense: 180 tablet; Refill: 0  2. Insomnia - hydrOXYzine (VISTARIL) 25 MG capsule; TAKE 1 TO 2 CAPSULES BY MOUTH EVERY DAY AT BEDTIME AS NEEDED FOR INSOMNIA  Dispense: 180 capsule; Refill: 0  3. MDD (major depressive disorder), recurrent episode, moderate (HCC) - sertraline (ZOLOFT) 100 MG tablet; Take 2 tablets (200 mg total) by mouth daily.  Dispense: 180 tablet; Refill: 0  - increase Buspar  Follow Up Instructions: In 2-3 months or sooner if needed   I discussed the assessment and treatment plan with the patient. The patient was provided an opportunity to ask questions and all were answered. The patient agreed with the plan and demonstrated an understanding of the instructions.   The patient was advised to call back or seek an in-person evaluation if the symptoms worsen or if the condition fails to improve as anticipated.  Oletta Darter, MD

## 2020-08-31 DIAGNOSIS — Z118 Encounter for screening for other infectious and parasitic diseases: Secondary | ICD-10-CM | POA: Diagnosis not present

## 2020-08-31 DIAGNOSIS — Z01419 Encounter for gynecological examination (general) (routine) without abnormal findings: Secondary | ICD-10-CM | POA: Diagnosis not present

## 2020-08-31 DIAGNOSIS — Z6834 Body mass index (BMI) 34.0-34.9, adult: Secondary | ICD-10-CM | POA: Diagnosis not present

## 2020-08-31 DIAGNOSIS — Z113 Encounter for screening for infections with a predominantly sexual mode of transmission: Secondary | ICD-10-CM | POA: Diagnosis not present

## 2020-09-14 DIAGNOSIS — L638 Other alopecia areata: Secondary | ICD-10-CM | POA: Diagnosis not present

## 2020-09-29 ENCOUNTER — Other Ambulatory Visit: Payer: Self-pay | Admitting: Registered Nurse

## 2020-09-29 DIAGNOSIS — E559 Vitamin D deficiency, unspecified: Secondary | ICD-10-CM

## 2020-10-09 DIAGNOSIS — Z20822 Contact with and (suspected) exposure to covid-19: Secondary | ICD-10-CM | POA: Diagnosis not present

## 2020-10-10 ENCOUNTER — Other Ambulatory Visit (HOSPITAL_COMMUNITY): Payer: Self-pay | Admitting: Psychiatry

## 2020-10-10 DIAGNOSIS — F411 Generalized anxiety disorder: Secondary | ICD-10-CM

## 2020-10-12 DIAGNOSIS — M2550 Pain in unspecified joint: Secondary | ICD-10-CM | POA: Diagnosis not present

## 2020-10-12 DIAGNOSIS — M329 Systemic lupus erythematosus, unspecified: Secondary | ICD-10-CM | POA: Diagnosis not present

## 2020-10-12 DIAGNOSIS — L659 Nonscarring hair loss, unspecified: Secondary | ICD-10-CM | POA: Diagnosis not present

## 2020-10-12 DIAGNOSIS — R768 Other specified abnormal immunological findings in serum: Secondary | ICD-10-CM | POA: Diagnosis not present

## 2020-10-19 ENCOUNTER — Other Ambulatory Visit (HOSPITAL_COMMUNITY): Payer: Self-pay | Admitting: Psychiatry

## 2020-10-19 DIAGNOSIS — F411 Generalized anxiety disorder: Secondary | ICD-10-CM

## 2020-10-28 ENCOUNTER — Other Ambulatory Visit (HOSPITAL_COMMUNITY): Payer: Self-pay | Admitting: Psychiatry

## 2020-10-28 DIAGNOSIS — F411 Generalized anxiety disorder: Secondary | ICD-10-CM

## 2020-11-02 DIAGNOSIS — L309 Dermatitis, unspecified: Secondary | ICD-10-CM | POA: Diagnosis not present

## 2020-11-02 DIAGNOSIS — L659 Nonscarring hair loss, unspecified: Secondary | ICD-10-CM | POA: Diagnosis not present

## 2020-11-07 DIAGNOSIS — J029 Acute pharyngitis, unspecified: Secondary | ICD-10-CM | POA: Diagnosis not present

## 2020-11-07 DIAGNOSIS — Z20822 Contact with and (suspected) exposure to covid-19: Secondary | ICD-10-CM | POA: Diagnosis not present

## 2020-11-19 ENCOUNTER — Telehealth (INDEPENDENT_AMBULATORY_CARE_PROVIDER_SITE_OTHER): Payer: Federal, State, Local not specified - PPO | Admitting: Psychiatry

## 2020-11-19 ENCOUNTER — Other Ambulatory Visit: Payer: Self-pay

## 2020-11-19 DIAGNOSIS — F331 Major depressive disorder, recurrent, moderate: Secondary | ICD-10-CM

## 2020-11-19 DIAGNOSIS — F411 Generalized anxiety disorder: Secondary | ICD-10-CM

## 2020-11-19 MED ORDER — BUSPIRONE HCL 15 MG PO TABS: 7.5000 mg | ORAL_TABLET | Freq: Two times a day (BID) | ORAL | 0 refills | Status: DC

## 2020-11-19 MED ORDER — SERTRALINE HCL 100 MG PO TABS: 200.0000 mg | ORAL_TABLET | Freq: Every day | ORAL | 0 refills | Status: DC

## 2020-11-19 MED ORDER — PROPRANOLOL HCL 10 MG PO TABS
10.0000 mg | ORAL_TABLET | Freq: Two times a day (BID) | ORAL | 0 refills | Status: DC
Start: 2020-11-19 — End: 2021-02-18

## 2020-11-19 NOTE — Progress Notes (Signed)
Virtual Visit via Video Note  I connected with Jamie Lopez on 11/19/20 at  4:00 PM EDT by a video enabled telemedicine application and verified that I am speaking with the correct person using two identifiers.  Location: Patient: work Provider: office   I discussed the limitations of evaluation and management by telemedicine and the availability of in person appointments. The patient expressed understanding and agreed to proceed.  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 Work is going well. This summer she is devoted to studying for GRE and other things in preparation for the fall semester. In the last 3 week she had 2 days she was depressed. On those days she was having Lupus flair. Her appetite is good. Her sleep is not good since she stopped taking the Melatonin. It takes her hours to fall asleep now. She rarely takes the Vistaril - only 3-5 times/month. It makes her very groggy the next day. She denies SI/HI. Overall her anxiety is not as bad as before. It is generally focused on school.    Observations/Objective: Psychiatric Specialty Exam: ROS  There were no vitals taken for this visit.There is no height or weight on file to calculate BMI.  General Appearance: Casual and Neat  Eye Contact:  Good  Speech:  Clear and Coherent and Normal Rate  Volume:  Normal  Mood:  Euthymic  Affect:  Full Range  Thought Process:  Goal Directed, Linear, and Descriptions of Associations: Intact  Orientation:  Full (Time, Place, and Person)  Thought Content:  Logical  Suicidal Thoughts:  No  Homicidal Thoughts:  No  Memory:  Immediate;   Good  Judgement:  Good  Insight:  Good  Psychomotor Activity:  Normal  Concentration:  Concentration: Good  Recall:  Good  Fund of Knowledge:  Good  Language:  Good  Akathisia:  No  Handed:  Right  AIMS (if indicated):     Assets:  Communication Skills Desire for Improvement Financial  Resources/Insurance Housing Leisure Time Resilience Social Support Talents/Skills Transportation Vocational/Educational  ADL's:  Intact  Cognition:  WNL  Sleep:        Assessment and Plan: Depression screen [facility] 2/9 11/19/2020 08/27/2020 07/08/2020 12/23/2019 01/02/2019  Decreased Interest 0 0 0 0 0  Down, Depressed, Hopeless 1 1 0 0 0  PHQ - 2 Score 1 1 0 0 0    Flowsheet Row Video Visit from 11/19/2020 in [facility] Video Visit from 08/27/2020 in [facility]  C-SSRS RISK CATEGORY No Risk No Risk      - no refill on Vistaril as pt states she has enough  1. GAD (generalized anxiety disorder) - busPIRone (BUSPAR) 15 MG tablet; Take 0.5 tablets (7.5 mg total) by mouth 2 (two) times daily.  Dispense: 90 tablet; Refill: 0 - sertraline (ZOLOFT) 100 MG tablet; Take 2 tablets (200 mg total) by mouth daily.  Dispense: 180 tablet; Refill: 0 - propranolol (INDERAL) 10 MG tablet; Take 1 tablet (10 mg total) by mouth 2 (two) times daily.  Dispense: 180 tablet; Refill: 0  2. MDD (major depressive disorder), recurrent episode, moderate (HCC) - sertraline (ZOLOFT) 100 MG tablet; Take 2 tablets (200 mg total) by mouth daily.  Dispense: 180 tablet; Refill: 0  -reviewed sleepy hygiene  Follow Up Instructions: In 3 months or sooner if needed   I discussed the assessment and treatment plan with the patient. The patient was provided an opportunity to ask questions and  all were answered. The patient agreed with the plan and demonstrated an understanding of the instructions.   The patient was advised to call back or seek an in-person evaluation if the symptoms worsen or if the condition fails to improve as anticipated.  I provided 12 minutes of non-face-to-face time during this encounter.   Oletta Darter, MD

## 2020-12-14 DIAGNOSIS — R768 Other specified abnormal immunological findings in serum: Secondary | ICD-10-CM | POA: Diagnosis not present

## 2020-12-14 DIAGNOSIS — M329 Systemic lupus erythematosus, unspecified: Secondary | ICD-10-CM | POA: Diagnosis not present

## 2020-12-14 DIAGNOSIS — M2550 Pain in unspecified joint: Secondary | ICD-10-CM | POA: Diagnosis not present

## 2020-12-14 DIAGNOSIS — L659 Nonscarring hair loss, unspecified: Secondary | ICD-10-CM | POA: Diagnosis not present

## 2020-12-30 ENCOUNTER — Ambulatory Visit: Payer: Federal, State, Local not specified - PPO | Admitting: Physician Assistant

## 2021-01-29 ENCOUNTER — Ambulatory Visit: Payer: Federal, State, Local not specified - PPO | Admitting: Registered Nurse

## 2021-01-29 ENCOUNTER — Encounter: Payer: Self-pay | Admitting: Registered Nurse

## 2021-01-29 ENCOUNTER — Other Ambulatory Visit: Payer: Self-pay

## 2021-01-29 VITALS — BP 118/74 | HR 88 | Temp 98.2°F | Resp 15 | Ht 62.0 in | Wt 181.2 lb

## 2021-01-29 DIAGNOSIS — R41840 Attention and concentration deficit: Secondary | ICD-10-CM | POA: Diagnosis not present

## 2021-01-29 DIAGNOSIS — G43809 Other migraine, not intractable, without status migrainosus: Secondary | ICD-10-CM

## 2021-01-29 MED ORDER — LISDEXAMFETAMINE DIMESYLATE 30 MG PO CAPS: 30.0000 mg | ORAL_CAPSULE | Freq: Every day | ORAL | 0 refills | Status: DC

## 2021-01-29 MED ORDER — RIZATRIPTAN BENZOATE 10 MG PO TBDP: 10.0000 mg | ORAL_TABLET | ORAL | 1 refills | Status: AC | PRN

## 2021-01-29 NOTE — Patient Instructions (Addendum)
My recommendation for assessment for attention deficit disorders is [facility]. While they treat ADD and ADHD, they operate under their primary care licenses, so no referral is required. Their information is as below:  [facility] [phone], [address] [place], [place] [address]  Phone: [phone] Email: [email or web address]  Of course, Dr. Michae Kava is probably your best bet - this is someone who already knows you and will best be able to determine if medication for a concentration deficit is warranted.   Thank you,  Rich

## 2021-01-29 NOTE — Progress Notes (Signed)
Established Patient Office Visit  Subjective:  Patient ID: Jamie Lopez, female    DOB: 08/05/1995  Age: 25 y.o. MRN: [id number]  CC:  Chief Complaint  Patient presents with   ADD    Pt complains of feeling unfocused and out of order throughout the day PHQ9 positive    HPI Jamie Lopez presents for concentration deficit  Ongoing. 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 Michae Kava in Psychiatry  Headaches - unilateral, intermittent, notes photophobia and nausea with these.   Continues to follow with [facility] for SLE.  Past Medical History:  Diagnosis Date   Anxiety    Asthma    Headache(784.0)    Lupus (HCC)    Rosacea    Seasonal allergies     Past Surgical History:  Procedure Laterality Date   NO PAST SURGERIES     WISDOM TOOTH EXTRACTION      Family History  Problem Relation Age of Onset   Anxiety disorder Mother    Diabetes Mellitus II Mother    Depression Mother    Anxiety disorder Maternal Grandfather    Depression Maternal Grandfather    Alcohol abuse Maternal Grandfather    Drug abuse Maternal Grandfather    Anxiety disorder Maternal Grandmother    Diabetes Mellitus II Maternal Grandmother    Depression Maternal Grandmother    Alcohol abuse Paternal Grandfather    Drug abuse Paternal Grandfather    Bipolar disorder Cousin    Schizophrenia Cousin    Drug abuse Father     Social History   Socioeconomic History   Marital status: Single    Spouse name: Not on file   Number of children: 0   Years of education: 14   Highest education level: Not on file  Occupational History   Occupation: student    Comment: [employer]  Tobacco Use   Smoking status: Light Smoker    Types: Cigarettes, Cigars    Last attempt to quit: 05/11/2013    Years since quitting: 7.7   Smokeless tobacco:  Never   Tobacco comments:    once a year  Vaping Use   Vaping Use: Never used  Substance and Sexual Activity   Alcohol use: Yes    Alcohol/week: 2.0 standard drinks    Types: 2 Glasses of wine per week    Comment: social: 1-2x/week   Drug use: Not Currently    Types: Marijuana    Comment: quit in 2018   Sexual activity: Yes    Partners: Male    Birth control/protection: Condom  Other Topics Concern   Not on file  Social History Narrative   Pt lives in [place] with mom and brother. Pt goes to [employer] and pt is a sophomore. Pt was born in [place] and raised by mom. Pt has 4 siblings and 1 on the way on her dad's side. Childhood was nice and she mostly raised as an child.  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      Social Determinants of Health   Financial Resource Strain: Not on file  Food Insecurity: Not on file  Transportation Needs: Not on file  Physical Activity: Not on file  Stress: Not on file  Social Connections: Not on file  Intimate Partner Violence: Not on file    Outpatient Medications Prior to  Visit  Medication Sig Dispense Refill   albuterol (PROVENTIL HFA;VENTOLIN HFA) 108 (90 Base) MCG/ACT inhaler Inhale 1-2 puffs into the lungs every 6 (six) hours as needed for wheezing or shortness of breath. 1 Inhaler 0   budesonide (RHINOCORT ALLERGY) 32 MCG/ACT nasal spray Place 1-2 sprays into both nostrils daily. 8.6 g 6   cetirizine (ZYRTEC) 10 MG tablet Take 1 tablet (10 mg total) by mouth daily. 90 tablet 3   diclofenac (VOLTAREN) 75 MG EC tablet Take 75 mg by mouth 2 (two) times daily.     etonogestrel-ethinyl estradiol (NUVARING) 0.12-0.015 MG/24HR vaginal ring Place 1 each vaginally every 28 (twenty-eight) days. Insert vaginally and leave in place for 3 consecutive weeks, then remove for 1 week.     hydroxychloroquine (PLAQUENIL) 200 MG tablet Take 200 mg by mouth 2 (two) times daily.     meloxicam (MOBIC) 15 MG tablet Take 15 mg by mouth daily as  needed for pain.     naproxen (NAPROSYN) 500 MG tablet Take 1 tablet (500 mg total) by mouth 2 (two) times daily. 30 tablet 0   Olopatadine HCl (PATADAY) 0.2 % SOLN Apply 1 drop to eye daily as needed. 2.5 mL 3   ondansetron (ZOFRAN-ODT) 4 MG disintegrating tablet Take 4 mg by mouth as needed.     triamcinolone cream (KENALOG) 0.1 % Apply 1 application topically 2 (two) times daily. 30 g 0   busPIRone (BUSPAR) 15 MG tablet Take 0.5 tablets (7.5 mg total) by mouth 2 (two) times daily. 90 tablet 0   hydrOXYzine (VISTARIL) 25 MG capsule TAKE 1 TO 2 CAPSULES BY MOUTH EVERY DAY AT BEDTIME AS NEEDED FOR INSOMNIA 180 capsule 0   propranolol (INDERAL) 10 MG tablet Take 1 tablet (10 mg total) by mouth 2 (two) times daily. 180 tablet 0   rizatriptan (MAXALT-MLT) 10 MG disintegrating tablet Take 10 mg by mouth as needed for migraine. May repeat in 2 hours if needed     sertraline (ZOLOFT) 100 MG tablet Take 2 tablets (200 mg total) by mouth daily. 180 tablet 0   PRESCRIPTION MEDICATION      Vitamin D, Ergocalciferol, (DRISDOL) 1.25 MG (50000 UNIT) CAPS capsule Take 1 capsule (50,000 Units total) by mouth every 7 (seven) days. (Patient not taking: Reported on 11/19/2020) 12 capsule 0   No facility-administered medications prior to visit.    Allergies  Allergen Reactions   Fruit & Vegetable Daily [Nutritional Supplements]    Peanut-Containing Drug Products     Tree nut specifically    Other     ROS Review of Systems  Constitutional: Negative.   HENT: Negative.    Eyes: Negative.   Respiratory: Negative.    Cardiovascular: Negative.   Gastrointestinal: Negative.   Genitourinary: Negative.   Musculoskeletal: Negative.   Skin: Negative.   Neurological: Negative.   Psychiatric/Behavioral:  Positive for decreased concentration, dysphoric mood and sleep disturbance. Negative for agitation, behavioral problems, confusion, hallucinations, self-injury and suicidal ideas. The patient is nervous/anxious.  The patient is not hyperactive.      Objective:    Physical Exam Vitals and nursing note reviewed.  Constitutional:      General: She is not in acute distress.    Appearance: Normal appearance. She is normal weight. She is not ill-appearing, toxic-appearing or diaphoretic.  Cardiovascular:     Rate and Rhythm: Normal rate and regular rhythm.     Heart sounds: Normal heart sounds. No murmur heard.   No friction rub. No gallop.  Pulmonary:     Effort: Pulmonary effort is normal. No respiratory distress.     Breath sounds: Normal breath sounds. No stridor. No wheezing, rhonchi or rales.  Chest:     Chest wall: No tenderness.  Skin:    General: Skin is warm and dry.  Neurological:     General: No focal deficit present.     Mental Status: She is alert and oriented to person, place, and time. Mental status is at baseline.  Psychiatric:        Mood and Affect: Mood normal.        Behavior: Behavior normal.        Thought Content: Thought content normal.        Judgment: Judgment normal.    BP 118/74   Pulse 88   Temp 98.2 F (36.8 C) (Temporal)   Resp 15   Ht 5\' 2"  (1.575 m)   Wt 181 lb 3.2 oz (82.2 kg)   SpO2 97%   BMI 33.14 kg/m  Wt Readings from Last 3 Encounters:  01/29/21 181 lb 3.2 oz (82.2 kg)  07/08/20 183 lb 12.8 oz (83.4 kg)  12/23/19 185 lb 6.4 oz (84.1 kg)     There are no preventive care reminders to display for this patient.   There are no preventive care reminders to display for this patient.  Lab Results  Component Value Date   TSH 0.682 12/23/2019   Lab Results  Component Value Date   WBC 4.4 07/08/2020   HGB 11.0 (L) 07/08/2020   HCT 33.1 (L) 07/08/2020   MCV 88 07/08/2020   PLT 350 07/08/2020   Lab Results  Component Value Date   NA 135 07/08/2020   K 4.2 07/08/2020   CO2 16 (L) 07/08/2020   GLUCOSE 84 07/08/2020   BUN 11 07/08/2020   CREATININE 0.73 07/08/2020   BILITOT 0.3 07/08/2020   ALKPHOS 53 07/08/2020   AST 17 07/08/2020    ALT 14 07/08/2020   PROT 7.5 07/08/2020   ALBUMIN 3.7 (L) 07/08/2020   CALCIUM 8.7 07/08/2020   ANIONGAP 8 05/31/2018   Lab Results  Component Value Date   CHOL 157 12/23/2019   Lab Results  Component Value Date   HDL 48 12/23/2019   Lab Results  Component Value Date   LDLCALC 95 12/23/2019   Lab Results  Component Value Date   TRIG 70 12/23/2019   Lab Results  Component Value Date   CHOLHDL 3.3 12/23/2019   Lab Results  Component Value Date   HGBA1C 5.6 12/23/2019      Assessment & Plan:   Problem List Items Addressed This Visit       Cardiovascular and Mediastinum   Migraine   Relevant Medications   rizatriptan (MAXALT-MLT) 10 MG disintegrating tablet   Other Visit Diagnoses     Concentration deficit    -  Primary   Relevant Medications   lisdexamfetamine (VYVANSE) 30 MG capsule       Meds ordered this encounter  Medications   lisdexamfetamine (VYVANSE) 30 MG capsule    Sig: Take 1 capsule (30 mg total) by mouth daily.    Dispense:  30 capsule    Refill:  0    Order Specific Question:   Supervising Provider    Answer:   12/25/2019, JEFFREY R [[id number]]   rizatriptan (MAXALT-MLT) 10 MG disintegrating tablet    Sig: Take 1 tablet (10 mg total) by mouth as needed for migraine. May repeat in 2  hours if needed    Dispense:  90 tablet    Refill:  1    Order Specific Question:   Supervising Provider    Answer:   Neva SeatGREENE, JEFFREY R [[id number]]    Follow-up: Return if symptoms worsen or fail to improve.   Discussed role of concentration deficit disorders in anxiety and depression, as well as role of chronic illness in affecting anxiety and depression. Can try a short course of vyvanse. Discuss with psychiatry for continuity. Can discuss FMLA for work. May benefit from temporary leave. Migraines may respond to maxalt.will give rx and monitor effect. Discussed risks, benefits, and alternatives to these medications. Patient encouraged to call clinic with any  questions, comments, or concerns.  Janeece Ageeichard Ranell Finelli, NP

## 2021-02-02 DIAGNOSIS — L659 Nonscarring hair loss, unspecified: Secondary | ICD-10-CM | POA: Diagnosis not present

## 2021-02-18 ENCOUNTER — Other Ambulatory Visit: Payer: Self-pay

## 2021-02-18 ENCOUNTER — Telehealth (INDEPENDENT_AMBULATORY_CARE_PROVIDER_SITE_OTHER): Payer: Federal, State, Local not specified - PPO | Admitting: Psychiatry

## 2021-02-18 ENCOUNTER — Telehealth (INDEPENDENT_AMBULATORY_CARE_PROVIDER_SITE_OTHER): Payer: Federal, State, Local not specified - PPO | Admitting: Registered Nurse

## 2021-02-18 ENCOUNTER — Encounter: Payer: Self-pay | Admitting: Registered Nurse

## 2021-02-18 ENCOUNTER — Telehealth: Payer: Self-pay

## 2021-02-18 DIAGNOSIS — F5105 Insomnia due to other mental disorder: Secondary | ICD-10-CM

## 2021-02-18 DIAGNOSIS — F411 Generalized anxiety disorder: Secondary | ICD-10-CM | POA: Diagnosis not present

## 2021-02-18 DIAGNOSIS — F331 Major depressive disorder, recurrent, moderate: Secondary | ICD-10-CM

## 2021-02-18 DIAGNOSIS — K2900 Acute gastritis without bleeding: Secondary | ICD-10-CM

## 2021-02-18 DIAGNOSIS — F99 Mental disorder, not otherwise specified: Secondary | ICD-10-CM | POA: Diagnosis not present

## 2021-02-18 DIAGNOSIS — D649 Anemia, unspecified: Secondary | ICD-10-CM | POA: Diagnosis not present

## 2021-02-18 MED ORDER — PROPRANOLOL HCL 10 MG PO TABS: 10.0000 mg | ORAL_TABLET | Freq: Two times a day (BID) | ORAL | 0 refills | Status: DC

## 2021-02-18 MED ORDER — HYDROXYZINE PAMOATE 25 MG PO CAPS: ORAL_CAPSULE | ORAL | 0 refills | Status: DC

## 2021-02-18 MED ORDER — SUCRALFATE 1 G PO TABS: 1.0000 g | ORAL_TABLET | Freq: Three times a day (TID) | ORAL | 0 refills | Status: DC

## 2021-02-18 MED ORDER — PANTOPRAZOLE SODIUM 40 MG PO TBEC
40.0000 mg | DELAYED_RELEASE_TABLET | Freq: Every day | ORAL | 3 refills | Status: DC
Start: 2021-02-18 — End: 2021-05-13

## 2021-02-18 MED ORDER — BUSPIRONE HCL 15 MG PO TABS: 7.5000 mg | ORAL_TABLET | Freq: Two times a day (BID) | ORAL | 0 refills | Status: DC

## 2021-02-18 MED ORDER — SERTRALINE HCL 100 MG PO TABS: 200.0000 mg | ORAL_TABLET | Freq: Every day | ORAL | 0 refills | Status: DC

## 2021-02-18 NOTE — Progress Notes (Signed)
Telemedicine Encounter- SOAP NOTE Established Patient  This telephone encounter was conducted with the patient's (or proxy's) verbal consent via audio telecommunications: yes  Patient was instructed to have this encounter in a suitably private space; and to only have persons present to whom they give permission to participate. In addition, patient identity was confirmed by use of name plus two identifiers (DOB and address).  I discussed the limitations, risks, security and privacy concerns of performing an evaluation and management service by telephone and the availability of in person appointments. I also discussed with the patient that there may be a patient responsible charge related to this service. The patient expressed understanding and agreed to proceed.  I spent a total of 15 minutes talking with the patient or their proxy.  Patient at home Provider in office  Participants: Jari Sportsman, NP and [facility]  Chief Complaint  Patient presents with   Abdominal Pain    Patient states she has been having a dull pain and burning sensation on the right side of her bell button. Patient states that the pain last for hours at a time. Patient states she was on a lot of Nsaids this year and feels like she so she feels like it is a stomach ulcer.    Subjective   Jamie Lopez is a 25 y.o. established patient. Telephone visit today for abdominal pain  HPI Pain onset earlier this year - usually after taking antiinflammatories Had stopped taking these in July  Pain is burning sensation, always in same spot  Has taken antacids/tums and zofran with no relief. Pain persists.  In addition to medication, would happen at times when hungry, improves but does not resolve after eating.  Some blood in stool - bright red only, no dark/tarry/melenic stool. Tends towards constipation, occ diarrhea - at baseline   Does note some lightheadedness, headaches, warm sensation since beginning of  August. No LOC. Only 2-3 instances of this all together.   Patient Active Problem List   Diagnosis Date Noted   Lupus (HCC) 07/16/2020   GAD (generalized anxiety disorder) 10/25/2012   Migraines 10/25/2012   Migraine 05/28/2011    Past Medical History:  Diagnosis Date   Anxiety    Asthma    Headache(784.0)    Lupus (HCC)    Rosacea    Seasonal allergies     Current Outpatient Medications  Medication Sig Dispense Refill   albuterol (PROVENTIL HFA;VENTOLIN HFA) 108 (90 Base) MCG/ACT inhaler Inhale 1-2 puffs into the lungs every 6 (six) hours as needed for wheezing or shortness of breath. 1 Inhaler 0   budesonide (RHINOCORT ALLERGY) 32 MCG/ACT nasal spray Place 1-2 sprays into both nostrils daily. 8.6 g 6   busPIRone (BUSPAR) 15 MG tablet Take 0.5 tablets (7.5 mg total) by mouth 2 (two) times daily. 90 tablet 0   cetirizine (ZYRTEC) 10 MG tablet Take 1 tablet (10 mg total) by mouth daily. 90 tablet 3   diclofenac (VOLTAREN) 75 MG EC tablet Take 75 mg by mouth 2 (two) times daily.     etonogestrel-ethinyl estradiol (NUVARING) 0.12-0.015 MG/24HR vaginal ring Place 1 each vaginally every 28 (twenty-eight) days. Insert vaginally and leave in place for 3 consecutive weeks, then remove for 1 week.     hydroxychloroquine (PLAQUENIL) 200 MG tablet Take 200 mg by mouth 2 (two) times daily.     hydrOXYzine (VISTARIL) 25 MG capsule TAKE 1 TO 2 CAPSULES BY MOUTH EVERY DAY AT BEDTIME AS NEEDED FOR INSOMNIA 180  capsule 0   lisdexamfetamine (VYVANSE) 30 MG capsule Take 1 capsule (30 mg total) by mouth daily. 30 capsule 0   meloxicam (MOBIC) 15 MG tablet Take 15 mg by mouth daily as needed for pain.     naproxen (NAPROSYN) 500 MG tablet Take 1 tablet (500 mg total) by mouth 2 (two) times daily. 30 tablet 0   Olopatadine HCl (PATADAY) 0.2 % SOLN Apply 1 drop to eye daily as needed. 2.5 mL 3   ondansetron (ZOFRAN-ODT) 4 MG disintegrating tablet Take 4 mg by mouth as needed.     propranolol (INDERAL) 10  MG tablet Take 1 tablet (10 mg total) by mouth 2 (two) times daily. 180 tablet 0   rizatriptan (MAXALT-MLT) 10 MG disintegrating tablet Take 1 tablet (10 mg total) by mouth as needed for migraine. May repeat in 2 hours if needed 90 tablet 1   sertraline (ZOLOFT) 100 MG tablet Take 2 tablets (200 mg total) by mouth daily. 180 tablet 0   triamcinolone cream (KENALOG) 0.1 % Apply 1 application topically 2 (two) times daily. 30 g 0   pantoprazole (PROTONIX) 40 MG tablet Take 1 tablet (40 mg total) by mouth daily. 30 tablet 3   sucralfate (CARAFATE) 1 g tablet Take 1 tablet (1 g total) by mouth 4 (four) times daily -  with meals and at bedtime. 40 tablet 0   No current facility-administered medications for this visit.    Allergies  Allergen Reactions   Fruit & Vegetable Daily [Nutritional Supplements]    Peanut-Containing Drug Products     Tree nut specifically    Other     Social History   Socioeconomic History   Marital status: Single    Spouse name: Not on file   Number of children: 0   Years of education: 14   Highest education level: Not on file  Occupational History   Occupation: student    Comment: [employer]  Tobacco Use   Smoking status: Light Smoker    Types: Cigarettes, Cigars    Last attempt to quit: 05/11/2013    Years since quitting: 7.7   Smokeless tobacco: Never   Tobacco comments:    once a year  Vaping Use   Vaping Use: Never used  Substance and Sexual Activity   Alcohol use: Yes    Alcohol/week: 2.0 standard drinks    Types: 2 Glasses of wine per week    Comment: social: 1-2x/week   Drug use: Not Currently    Types: Marijuana    Comment: quit in 2018   Sexual activity: Yes    Partners: Male    Birth control/protection: Condom  Other Topics Concern   Not on file  Social History Narrative   Pt lives in [place] with mom and brother. Pt goes to  [employer] and pt is a sophomore. Pt was born in [place] and raised by mom. Pt has 4 siblings and 1 on the way on  her dad's side. Childhood was nice and she mostly raised as an child.  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      Social Determinants of Health   Financial Resource Strain: Not on file  Food Insecurity: Not on file  Transportation Needs: Not on file  Physical Activity: Not on file  Stress: Not on file  Social Connections: Not on file  Intimate Partner Violence: Not on file    Review of Systems  Constitutional: Negative.   HENT: Negative.  Eyes: Negative.   Respiratory: Negative.    Cardiovascular: Negative.   Gastrointestinal:  Positive for abdominal pain, blood in stool (brbpr), constipation, diarrhea and nausea. Negative for heartburn, melena and vomiting.  Genitourinary: Negative.   Musculoskeletal: Negative.   Skin: Negative.   Neurological:  Positive for dizziness and headaches.  Endo/Heme/Allergies: Negative.   Psychiatric/Behavioral: Negative.    All other systems reviewed and are negative.  Objective   Vitals as reported by the patient: There were no vitals filed for this visit.  Yuli was seen today for abdominal pain.  Diagnoses and all orders for this visit:  Anemia, unspecified type -     CBC with Differential/Platelet; Future -     Iron, TIBC and Ferritin Panel; Future  Acute gastritis, presence of bleeding unspecified, unspecified gastritis type -     pantoprazole (PROTONIX) 40 MG tablet; Take 1 tablet (40 mg total) by mouth daily. -     sucralfate (CARAFATE) 1 g tablet; Take 1 tablet (1 g total) by mouth 4 (four) times daily -  with meals and at bedtime.   PLAN Symptoms point to gastritis. No clear evidence of GI bleed but plan to obtain CBC to help confirm. Last CBC in Feb 2022 showed mild anemia -will check iron counts as well. Future orders placed. Plan to start pantoprazole 40mg  po qd, sucralfate qid as above. If no improvement, can consider GI referral, h pylori testing.  Encouraged adequate hydration and avoidance of trigger  foods Patient encouraged to call clinic with any questions, comments, or concerns.  I discussed the assessment and treatment plan with the patient. The patient was provided an opportunity to ask questions and all were answered. The patient agreed with the plan and demonstrated an understanding of the instructions.   The patient was advised to call back or seek an in-person evaluation if the symptoms worsen or if the condition fails to improve as anticipated.  I provided 15 minutes of non-face-to-face time during this encounter.  , NP

## 2021-02-18 NOTE — Telephone Encounter (Signed)
Patient called in wanting to know if we had received any forms to fill out in regards to her short term disability. Please advise

## 2021-02-18 NOTE — Telephone Encounter (Signed)
Patient called about disability forms. Have there been any sent in for patient? Please advise

## 2021-02-18 NOTE — Progress Notes (Signed)
Virtual Visit via Video Note  I connected with Jamie Lopez on 02/18/21 at  3:00 PM EDT by a video enabled telemedicine application and verified that I am speaking with the correct person using two identifiers.  Location: Patient: home Provider: office   I discussed the limitations of evaluation and management by telemedicine and the availability of in person appointments. The patient expressed understanding and agreed to proceed.  History of Present Illness: "I have been better". 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 Her father's birthday was in early September. She noticed shortly after she was crying and isolating from others. Her activity level went down. Her friend's came by to check on her and also told her that she is very down. Her focus has been down. She has trouble transitioning from work to school.  It get worse at certain of the year. She notices it always happening around her dad's birthday. It feels like her brain just stops and her therapist called it "brain jog". Due to her physical illness she asked her PCP to write her of work for a short period of time. She shared all this with her PCP who started her on Vyvanse. She hopes to start it soon once it is approved. Now her depression is slowly improving. She still has low days and has been really hard on herself a lot. Her anxiety is mild and not really noticeable. Jamie Lopez is sleeping a little better but she does wake up in the middle in the night. She is able to fall back asleep quickly. Her energy is generally low. She denies SI/HI.    Observations/Objective: Psychiatric Specialty Exam: ROS  There were no vitals taken for this visit.There is no height or weight on file to calculate BMI.  General Appearance: Casual and Neat  Eye Contact:  Good  Speech:  Clear and Coherent and Normal Rate  Volume:  Normal  Mood:  Depressed  Affect:  Congruent  Thought Process:  Goal Directed, Linear, and Descriptions of Associations:  Intact  Orientation:  Full (Time, Place, and Person)  Thought Content:  Logical  Suicidal Thoughts:  No  Homicidal Thoughts:  No  Memory:  Immediate;   Good  Judgement:  Good  Insight:  Good  Psychomotor Activity:  Normal  Concentration:  Concentration: Good  Recall:  Good  Fund of Knowledge:  Good  Language:  Good  Akathisia:  No  Handed:  Right  AIMS (if indicated):     Assets:  Communication Skills Desire for Improvement Financial Resources/Insurance Housing Resilience Social Support Talents/Skills Transportation Vocational/Educational  ADL's:  Intact  Cognition:  WNL  Sleep:        Assessment and Plan: Depression screen [facility] 2/9 02/18/2021 01/29/2021 11/19/2020 08/27/2020 07/08/2020  Decreased Interest 2 1 0 0 0  Down, Depressed, Hopeless 2 1 1 1  0  PHQ - 2 Score 4 2 1 1  0  Altered sleeping 1 2 - - -  Tired, decreased energy 2 2 - - -  Change in appetite 1 1 - - -  Feeling bad or failure about yourself  2 1 - - -  Trouble concentrating 2 2 - - -  Moving slowly or fidgety/restless 0 2 - - -  Suicidal thoughts 0 0 - - -  PHQ-9 Score 12 12 - - -  Difficult doing work/chores Very difficult - - - -    Flowsheet Row Video Visit [facility]/18/2021 in [facility] Video Visit from 11[facility]19/2020 [facility] Visit from 08/27/2020 in [facility]  C-SSRS RISK CATEGORY No Risk No Risk No Risk      She feels her meds help her control her depression most of the time. She is noticing at bad times of the year (Sept is dad's birthday and March is when he passed) she is depressed even with meds.   Jamie Lopez would like to wait and see if her symptoms improve within the next 4 weeks.   - talked about the importance of self care  1. GAD (generalized anxiety disorder) - busPIRone (BUSPAR) 15 MG tablet; Take 0.5 tablets (7.5 mg total) by mouth 2 (two) times daily.   Dispense: 90 tablet; Refill: 0 - propranolol (INDERAL) 10 MG tablet; Take 1 tablet (10 mg total) by mouth 2 (two) times daily.  Dispense: 180 tablet; Refill: 0 - sertraline (ZOLOFT) 100 MG tablet; Take 2 tablets (200 mg total) by mouth daily.  Dispense: 180 tablet; Refill: 0  2. Insomnia - hydrOXYzine (VISTARIL) 25 MG capsule; TAKE 1 TO 2 CAPSULES BY MOUTH EVERY DAY AT BEDTIME AS NEEDED FOR INSOMNIA  Dispense: 180 capsule; Refill: 0  3. MDD (major depressive disorder), recurrent episode, moderate (HCC) - sertraline (ZOLOFT) 100 MG tablet; Take 2 tablets (200 mg total) by mouth daily.  Dispense: 180 tablet; Refill: 0   Follow Up Instructions: In 4-6 weeks or sooner if needed   I discussed the assessment and treatment plan with the patient. The patient was provided an opportunity to ask questions and all were answered. The patient agreed with the plan and demonstrated an understanding of the instructions.   The patient was advised to call back or seek an in-person evaluation if the symptoms worsen or if the condition fails to improve as anticipated.  I provided 16 minutes of non-face-to-face time during this encounter.   Oletta Darter, MD

## 2021-02-18 NOTE — Patient Instructions (Signed)
° ° ° °  If you have lab work done today you will be contacted with your lab results within the next 2 weeks.  If you have not heard from us then please contact us. The fastest way to get your results is to register for My Chart. ° ° °IF you received an x-ray today, you will receive an invoice from [facility]. Please contact [facility] at [phone] with questions or concerns regarding your invoice.  ° °IF you received labwork today, you will receive an invoice from [facility]. Please contact [facility] at [phone] with questions or concerns regarding your invoice.  ° °Our billing staff will not be able to assist you with questions regarding bills from these companies. ° °You will be contacted with the lab results as soon as they are available. The fastest way to get your results is to activate your My Chart account. Instructions are located on the last page of this paperwork. If you have not heard from us regarding the results in 2 weeks, please contact this office. °  ° ° ° °

## 2021-02-22 NOTE — Telephone Encounter (Signed)
Have filled out and returned  Thanks  Luan Pulling

## 2021-03-16 DIAGNOSIS — M2550 Pain in unspecified joint: Secondary | ICD-10-CM | POA: Diagnosis not present

## 2021-03-16 DIAGNOSIS — M329 Systemic lupus erythematosus, unspecified: Secondary | ICD-10-CM | POA: Diagnosis not present

## 2021-03-16 DIAGNOSIS — R768 Other specified abnormal immunological findings in serum: Secondary | ICD-10-CM | POA: Diagnosis not present

## 2021-03-16 DIAGNOSIS — L659 Nonscarring hair loss, unspecified: Secondary | ICD-10-CM | POA: Diagnosis not present

## 2021-03-20 ENCOUNTER — Emergency Department (HOSPITAL_BASED_OUTPATIENT_CLINIC_OR_DEPARTMENT_OTHER)
Admission: EM | Admit: 2021-03-20 | Discharge: 2021-03-21 | Disposition: A | Discharge status: Home / Self Care | Payer: Federal, State, Local not specified - PPO | Attending: Emergency Medicine | Admitting: Emergency Medicine

## 2021-03-20 ENCOUNTER — Other Ambulatory Visit: Payer: Self-pay

## 2021-03-20 ENCOUNTER — Ambulatory Visit: Admit: 2021-03-20 | Discharge status: Absent without leave | Payer: Federal, State, Local not specified - PPO

## 2021-03-20 ENCOUNTER — Emergency Department (HOSPITAL_COMMUNITY): Payer: Federal, State, Local not specified - PPO

## 2021-03-20 ENCOUNTER — Encounter (HOSPITAL_BASED_OUTPATIENT_CLINIC_OR_DEPARTMENT_OTHER): Payer: Self-pay

## 2021-03-20 DIAGNOSIS — D649 Anemia, unspecified: Secondary | ICD-10-CM | POA: Diagnosis not present

## 2021-03-20 DIAGNOSIS — F17210 Nicotine dependence, cigarettes, uncomplicated: Secondary | ICD-10-CM | POA: Diagnosis not present

## 2021-03-20 DIAGNOSIS — H539 Unspecified visual disturbance: Secondary | ICD-10-CM | POA: Diagnosis not present

## 2021-03-20 DIAGNOSIS — Z79899 Other long term (current) drug therapy: Secondary | ICD-10-CM | POA: Diagnosis not present

## 2021-03-20 DIAGNOSIS — R519 Headache, unspecified: Secondary | ICD-10-CM | POA: Insufficient documentation

## 2021-03-20 DIAGNOSIS — Z7951 Long term (current) use of inhaled steroids: Secondary | ICD-10-CM | POA: Insufficient documentation

## 2021-03-20 DIAGNOSIS — Z91010 Allergy to peanuts: Secondary | ICD-10-CM | POA: Diagnosis not present

## 2021-03-20 DIAGNOSIS — H538 Other visual disturbances: Secondary | ICD-10-CM | POA: Diagnosis not present

## 2021-03-20 DIAGNOSIS — J45909 Unspecified asthma, uncomplicated: Secondary | ICD-10-CM | POA: Insufficient documentation

## 2021-03-20 DIAGNOSIS — D72819 Decreased white blood cell count, unspecified: Secondary | ICD-10-CM | POA: Diagnosis not present

## 2021-03-20 DIAGNOSIS — R799 Abnormal finding of blood chemistry, unspecified: Secondary | ICD-10-CM | POA: Diagnosis not present

## 2021-03-20 DIAGNOSIS — Z20822 Contact with and (suspected) exposure to covid-19: Secondary | ICD-10-CM | POA: Insufficient documentation

## 2021-03-20 DIAGNOSIS — Z0389 Encounter for observation for other suspected diseases and conditions ruled out: Secondary | ICD-10-CM | POA: Diagnosis not present

## 2021-03-20 LAB — COMPREHENSIVE METABOLIC PANEL
ALT: 16 U/L (ref 0–44)
AST: 20 U/L (ref 15–41)
Albumin: 3.5 g/dL (ref 3.5–5.0)
Alkaline Phosphatase: 54 U/L (ref 38–126)
Anion gap: 7 (ref 5–15)
BUN: 9 mg/dL (ref 6–20)
CO2: 21 mmol/L — ABNORMAL LOW (ref 22–32)
Calcium: 8.7 mg/dL — ABNORMAL LOW (ref 8.9–10.3)
Chloride: 108 mmol/L (ref 98–111)
Creatinine, Ser: 0.73 mg/dL (ref 0.44–1.00)
GFR, Estimated: >60 mL/min (ref >60)
Glucose, Bld: 97 mg/dL (ref 70–99)
Potassium: 3.8 mmol/L (ref 3.5–5.1)
Sodium: 136 mmol/L (ref 135–145)
Total Bilirubin: 0.3 mg/dL (ref 0.3–1.2)
Total Protein: 8.2 g/dL — ABNORMAL HIGH (ref 6.5–8.1)

## 2021-03-20 LAB — CBC
HCT: 35.5 % — ABNORMAL LOW (ref 36.0–46.0)
Hemoglobin: 11.9 g/dL — ABNORMAL LOW (ref 12.0–15.0)
MCH: 29.6 pg (ref 26.0–34.0)
MCHC: 33.5 g/dL (ref 30.0–36.0)
MCV: 88.3 fL (ref 80.0–100.0)
Platelets: 361 10*3/uL (ref 150–400)
RBC: 4.02 MIL/uL (ref 3.87–5.11)
RDW: 13 % (ref 11.5–15.5)
WBC: 3.8 10*3/uL — ABNORMAL LOW (ref 4.0–10.5)
nRBC: 0 % (ref 0.0–0.2)

## 2021-03-20 LAB — URINALYSIS, ROUTINE W REFLEX MICROSCOPIC
Bilirubin Urine: NEGATIVE
Glucose, UA: NEGATIVE mg/dL
Hgb urine dipstick: NEGATIVE
Ketones, ur: NEGATIVE mg/dL
Leukocytes,Ua: NEGATIVE
Nitrite: NEGATIVE
Protein, ur: NEGATIVE mg/dL
Specific Gravity, Urine: 1.03 (ref 1.005–1.030)
pH: 7 (ref 5.0–8.0)

## 2021-03-20 LAB — RESP PANEL BY RT-PCR (FLU A&B, COVID) ARPGX2
Influenza A by PCR: NEGATIVE
Influenza B by PCR: NEGATIVE
SARS Coronavirus 2 by RT PCR: NEGATIVE

## 2021-03-20 LAB — LIPASE, BLOOD: Lipase: 28 U/L (ref 11–51)

## 2021-03-20 LAB — PREGNANCY, URINE: Preg Test, Ur: NEGATIVE

## 2021-03-20 MED ORDER — DIPHENHYDRAMINE HCL 25 MG PO CAPS
25.0000 mg | ORAL_CAPSULE | Freq: Once | ORAL | Status: AC
  Administered 2021-03-20: 25 mg via ORAL
  Filled 2021-03-20: qty 1

## 2021-03-20 MED ORDER — FLUORESCEIN SODIUM 1 MG OP STRP
1.0000 | ORAL_STRIP | Freq: Once | OPHTHALMIC | Status: AC
  Administered 2021-03-20: 1 via OPHTHALMIC
  Filled 2021-03-20: qty 1

## 2021-03-20 MED ORDER — METOCLOPRAMIDE HCL 10 MG PO TABS
10.0000 mg | ORAL_TABLET | Freq: Once | ORAL | Status: AC
  Administered 2021-03-20: 10 mg via ORAL
  Filled 2021-03-20: qty 1

## 2021-03-20 MED ORDER — LORAZEPAM 2 MG/ML IJ SOLN: 0.5000 mg | Freq: Once | INTRAMUSCULAR | Status: DC

## 2021-03-20 MED ORDER — TETRACAINE HCL 0.5 % OP SOLN
2.0000 [drp] | Freq: Once | OPHTHALMIC | Status: AC
  Administered 2021-03-20: 2 [drp] via OPHTHALMIC
  Filled 2021-03-20: qty 4

## 2021-03-20 MED ORDER — ACETAMINOPHEN 325 MG PO TABS
650.0000 mg | ORAL_TABLET | Freq: Once | ORAL | Status: AC
  Administered 2021-03-20: 650 mg via ORAL
  Filled 2021-03-20: qty 2

## 2021-03-20 NOTE — ED Triage Notes (Signed)
Pt arrives ambulatory to ED with reports of having low WBC come back on her lab work after seeing rheumatology this week. States she was diagnosed with lupus last year. Pt also reports having some blurred vision in her right eye. States only other symptoms right now fatigued and having headaches.

## 2021-03-20 NOTE — ED Provider Notes (Signed)
  Physical Exam  BP 114/66 (BP Location: Right Arm)   Pulse 96   Temp 98.4 F (36.9 C) (Oral)   Resp 18   Ht 5\' 2"  (1.575 m)   Wt 83.9 kg   SpO2 95%   BMI 33.84 kg/m   Physical Exam  ED Course/Procedures   Clinical Course as of 03/20/21 2326  Sat Mar 20, 2021  1955 Discussed with Dr. Mar 22, 2021 of neurology will transfer patient to [facility] for MRI brain with and without contrast and orbits with contrast  Discussed with Dr. Redge Gainer who will accept patient in transfer  Discussed with charge nurse who is aware of patient transfer.  Ativan order placed for anxiety should she experience this. [WF]    Clinical Course User Index [WF] Silverio Lay, [facility]    Procedures  MDM  Patient care assumed upon transfer around 9 PM.  Patient is transferred from [facility] for MRI brain and MRI orbits for rule out optic neuritis versus MS.  Patient has a history of lupus on Plaquenil.  11:27 PM Signed out to Dr. [facility] to follow up MRI        Bebe Shaggy, MD 03/20/21 [phone]

## 2021-03-20 NOTE — ED Provider Notes (Signed)
[facility] EMERGENCY DEPARTMENT Provider Note   CSN: [id number] Arrival date & time: 03/20/21  1444     History Chief Complaint  Patient presents with   Abnormal Lab    Jamie Lopez is a 25 y.o. female.  HPI Patient is a 25 year old female presented to the ER today with history of lupus diagnosed 06/12/2020 currently on Plaquenil no longer taking prednisone because of GI upset  This morning she woke up with some cloudy vision in her right eye initially felt like it was just normal morning eye crust however noted continued blurry vision over the course of the day and developed a headache that is frontal has been mild and constant throughout the day.  She has a history of headaches and states this is not different from her normal headaches that she suffers.  She does not have issues with blurry vision however.  She does wear contacts and glasses.  Is wearing glasses currently.  Denies any eye injuries no nausea or vomiting no weakness or numbness or slurred speech no confusion no head injuries no other associate symptoms.     Past Medical History:  Diagnosis Date   Anxiety    Asthma    Headache(784.0)    Lupus (HCC)    Rosacea    Seasonal allergies     Patient Active Problem List   Diagnosis Date Noted   Lupus (HCC) 07/16/2020   GAD (generalized anxiety disorder) 10/25/2012   Migraines 10/25/2012   Migraine 05/28/2011    Past Surgical History:  Procedure Laterality Date   NO PAST SURGERIES     WISDOM TOOTH EXTRACTION       OB History   No obstetric history on file.     Family History  Problem Relation Age of Onset   Anxiety disorder Mother    Diabetes Mellitus II Mother    Depression Mother    Anxiety disorder Maternal Grandfather    Depression Maternal Grandfather    Alcohol abuse Maternal Grandfather    Drug abuse Maternal Grandfather    Anxiety disorder Maternal Grandmother    Diabetes Mellitus II Maternal Grandmother    Depression  Maternal Grandmother    Alcohol abuse Paternal Grandfather    Drug abuse Paternal Grandfather    Bipolar disorder Cousin    Schizophrenia Cousin    Drug abuse Father     Social History   Tobacco Use   Smoking status: Light Smoker    Types: Cigarettes, Cigars    Last attempt to quit: 05/11/2013    Years since quitting: 7.8   Smokeless tobacco: Never   Tobacco comments:    once a year  Vaping Use   Vaping Use: Never used  Substance Use Topics   Alcohol use: Yes    Alcohol/week: 2.0 standard drinks    Types: 2 Glasses of wine per week    Comment: social: 1-2x/week   Drug use: Not Currently    Types: Marijuana    Comment: quit in 2018    Home Medications Prior to Admission medications   Medication Sig Start Date End Date Taking? Authorizing Provider  albuterol (PROVENTIL HFA;VENTOLIN HFA) 108 (90 Base) MCG/ACT inhaler Inhale 1-2 puffs into the lungs every 6 (six) hours as needed for wheezing or shortness of breath. 09/29/17   Law, Waylan Boga, PA-C  budesonide (RHINOCORT ALLERGY) 32 MCG/ACT nasal spray Place 1-2 sprays into both nostrils daily. 08/25/18   Shade Flood, MD  busPIRone (BUSPAR) 15 MG tablet Take 0.5 tablets (  7.5 mg total) by mouth 2 (two) times daily. 02/18/21   Oletta Darter, MD  cetirizine (ZYRTEC) 10 MG tablet Take 1 tablet (10 mg total) by mouth daily. 08/25/18   Shade Flood, MD  diclofenac (VOLTAREN) 75 MG EC tablet Take 75 mg by mouth 2 (two) times daily.    [provider]  etonogestrel-ethinyl estradiol (NUVARING) 0.12-0.015 MG/24HR vaginal ring Place 1 each vaginally every 28 (twenty-eight) days. Insert vaginally and leave in place for 3 consecutive weeks, then remove for 1 week.    [provider]  hydroxychloroquine (PLAQUENIL) 200 MG tablet Take 200 mg by mouth 2 (two) times daily. 06/30/20   [provider]  hydrOXYzine (VISTARIL) 25 MG capsule TAKE 1 TO 2 CAPSULES BY MOUTH EVERY DAY AT BEDTIME AS NEEDED FOR INSOMNIA  02/18/21   Oletta Darter, MD  lisdexamfetamine (VYVANSE) 30 MG capsule Take 1 capsule (30 mg total) by mouth daily. Patient not taking: Reported on 02/18/2021 01/29/21   Janeece Agee, NP  meloxicam (MOBIC) 15 MG tablet Take 15 mg by mouth daily as needed for pain.    [provider]  naproxen (NAPROSYN) 500 MG tablet Take 1 tablet (500 mg total) by mouth 2 (two) times daily. 06/01/18   Kendrick, Caitlyn S, PA-C  Olopatadine HCl (PATADAY) 0.2 % SOLN Apply 1 drop to eye daily as needed. 08/25/18   Shade Flood, MD  ondansetron (ZOFRAN-ODT) 4 MG disintegrating tablet Take 4 mg by mouth as needed. 06/30/20   [provider]  pantoprazole (PROTONIX) 40 MG tablet Take 1 tablet (40 mg total) by mouth daily. Patient not taking: Reported on 02/18/2021 02/18/21   Janeece Agee, NP  propranolol (INDERAL) 10 MG tablet Take 1 tablet (10 mg total) by mouth 2 (two) times daily. 02/18/21   Oletta Darter, MD  rizatriptan (MAXALT-MLT) 10 MG disintegrating tablet Take 1 tablet (10 mg total) by mouth as needed for migraine. May repeat in 2 hours if needed 01/29/21   Janeece Agee, NP  sertraline (ZOLOFT) 100 MG tablet Take 2 tablets (200 mg total) by mouth daily. 02/18/21   Oletta Darter, MD  sucralfate (CARAFATE) 1 g tablet Take 1 tablet (1 g total) by mouth 4 (four) times daily -  with meals and at bedtime. Patient not taking: Reported on 02/18/2021 02/18/21   Janeece Agee, NP  triamcinolone cream (KENALOG) 0.1 % Apply 1 application topically 2 (two) times daily. 01/02/19   Janeece Agee, NP    Allergies    Fruit & vegetable daily [nutritional supplements], Peanut-containing drug products, and Other  Review of Systems   Review of Systems  Constitutional:  Negative for fever.  HENT:  Negative for congestion.   Eyes:  Positive for visual disturbance.  Respiratory:  Negative for shortness of breath.   Cardiovascular:  Negative for chest pain.  Gastrointestinal:  Negative for abdominal  distention.  Neurological:  Positive for headaches. Negative for dizziness.   Physical Exam Updated Vital Signs BP 114/66 (BP Location: Right Arm)   Pulse 96   Temp 98.4 F (36.9 C) (Oral)   Resp 18   Ht 5\' 2"  (1.575 m)   Wt 83.9 kg   SpO2 95%   BMI 33.84 kg/m   Physical Exam Vitals and nursing note reviewed.  Constitutional:      General: She is not in acute distress. HENT:     Head: Normocephalic and atraumatic.     Nose: Nose normal.  Eyes:     General: No scleral  icterus. Cardiovascular:     Rate and Rhythm: Normal rate and regular rhythm.     Pulses: Normal pulses.     Heart sounds: Normal heart sounds.  Pulmonary:     Effort: Pulmonary effort is normal. No respiratory distress.     Breath sounds: No wheezing.  Abdominal:     Palpations: Abdomen is soft.     Tenderness: There is no abdominal tenderness.  Musculoskeletal:     Cervical back: Normal range of motion.     Right lower leg: No edema.     Left lower leg: No edema.  Skin:    General: Skin is warm and dry.     Capillary Refill: Capillary refill takes 2 to 3 seconds.  Neurological:     Mental Status: She is alert. Mental status is at baseline.     Comments: Alert and oriented to self, place, time and event.   Speech is fluent, clear without dysarthria or dysphasia.   Strength 5/5 in upper/lower extremities   Sensation intact in upper/lower extremities   NML gait.  CN I not tested  CN II grossly intact visual fields bilaterally. Did not visualize posterior eye.  CN III, IV, VI PERRLA and EOMs intact bilaterally  CN V Intact sensation to sharp and light touch to the face  CN VII facial movements symmetric  CN VIII not tested  CN IX, X no uvula deviation, symmetric rise of soft palate  CN XI 5/5 SCM and trapezius strength bilaterally  CN XII Midline tongue protrusion, symmetric L/R movements   Psychiatric:        Mood and Affect: Mood normal.        Behavior: Behavior normal.    ED Results /  Procedures / Treatments   Labs (all labs ordered are listed, but only abnormal results are displayed) Labs Reviewed  COMPREHENSIVE METABOLIC PANEL - Abnormal; Notable for the following components:      Result Value   CO2 21 (*)    Calcium 8.7 (*)    Total Protein 8.2 (*)    All other components within normal limits  CBC - Abnormal; Notable for the following components:   WBC 3.8 (*)    Hemoglobin 11.9 (*)    HCT 35.5 (*)    All other components within normal limits  RESP PANEL BY RT-PCR (FLU A&B, COVID) ARPGX2  LIPASE, BLOOD  URINALYSIS, ROUTINE W REFLEX MICROSCOPIC  PREGNANCY, URINE    EKG None  Radiology No results found.  Procedures Procedures   Medications Ordered in ED Medications  LORazepam (ATIVAN) injection 0.5 mg (has no administration in time range)  metoCLOPramide (REGLAN) tablet 10 mg (10 mg Oral Given 03/20/21 1808)  diphenhydrAMINE (BENADRYL) capsule 25 mg (25 mg Oral Given 03/20/21 1808)  acetaminophen (TYLENOL) tablet 650 mg (650 mg Oral Given 03/20/21 1808)  fluorescein ophthalmic strip 1 strip (1 strip Right Eye Given 03/20/21 1808)  tetracaine (PONTOCAINE) 0.5 % ophthalmic solution 2 drop (2 drops Right Eye Given by Other 03/20/21 1807)    ED Course  I have reviewed the triage vital signs and the nursing notes.  Pertinent labs & imaging results that were available during my care of the patient were reviewed by me and considered in my medical decision making (see chart for details).  Clinical Course as of 03/20/21 2103  Sat Mar 20, 2021  1955 Discussed with Dr. Thomasena Edis of neurology will transfer patient to [facility] for MRI brain with and without contrast and orbits with  contrast  Discussed with Dr. Silverio Lay who will accept patient in transfer  Discussed with charge nurse who is aware of patient transfer.  Ativan order placed for anxiety should she experience this. [WF]    Clinical Course User Index [WF] Gailen Shelter, [place]   MDM  Rules/Calculators/A&P                          Patient is a 25 year old female with diagnosis 9 months ago of lupus  Woke up today with right eye pain visual disturbance and headache.  Neurologically intact on my examination.  Headache seems to be quite mild.  Given Tylenol Benadryl Reglan as headache abortives.  Eye exam R 20/100, L 20/30, B 20/30  Fluorescein exam unremarkable. Tono-Pen: 15, 20, 12 in right eye  CBC with mild leukopenia mild anemia.  CMP unremarkable.  COVID influenza negative lipase within normal limits and UA unremarkable.  Discussed with Dr. Thomasena Edis of neurology.  Will transfer patient to [facility] for MRI brain and orbits.  I discussed this case with my attending physician who cosigned this note including patient's presenting symptoms, physical exam, and planned diagnostics and interventions. Attending physician stated agreement with plan or made changes to plan which were implemented.   Attending physician assessed patient at bedside.   Patient agreeable to plan. Will go by POV with grandmother.  Final Clinical Impression(s) / ED Diagnoses Final diagnoses:  Vision disturbance    Rx / DC Orders ED Discharge Orders     None        Gailen Shelter, [place] 03/20/21 2107    Alvira Monday, MD 03/22/21 [phone]

## 2021-03-20 NOTE — ED Notes (Signed)
ED Provider at bedside. [place], PA

## 2021-03-21 DIAGNOSIS — Z0389 Encounter for observation for other suspected diseases and conditions ruled out: Secondary | ICD-10-CM | POA: Diagnosis not present

## 2021-03-21 MED ORDER — GADOBUTROL 1 MMOL/ML IV SOLN
8.0000 mL | Freq: Once | INTRAVENOUS | Status: AC | PRN
  Administered 2021-03-21: 8 mL via INTRAVENOUS

## 2021-03-21 NOTE — ED Provider Notes (Signed)
MRI is negative.  Patient is awake and alert but reports continued visual changes in  right eye Patient is awake alert, no acute distress. I discussed the case with on-call ophthalmologist Dr. Wynelle Link.  We discussed the case in its entirety including MRI brain and orbits that were negative, normal IOP, and negative fluorescein stain. She was informed that her visual acuity in her right eye was 20/100 She will see patient in her clinic on the morning of October 24 around 8 AM. This was discussed with the patient   Zadie Rhine, MD 03/21/21 0110

## 2021-03-22 DIAGNOSIS — Z7961 Long term (current) use of immunomodulator: Secondary | ICD-10-CM | POA: Diagnosis not present

## 2021-03-23 ENCOUNTER — Telehealth: Payer: Self-pay

## 2021-03-23 DIAGNOSIS — R41840 Attention and concentration deficit: Secondary | ICD-10-CM

## 2021-03-23 MED ORDER — LISDEXAMFETAMINE DIMESYLATE 30 MG PO CAPS: 30.0000 mg | ORAL_CAPSULE | Freq: Every day | ORAL | 0 refills | Status: DC

## 2021-03-23 NOTE — Telephone Encounter (Signed)
Caller name:Melonee Yaw   On DPR? :No  Call back [phone]  Provider they see: Richard   Reason for call:Needs pre-approval for lisdexamfetamine (VYVANSE) 30 MG capsule   [id number] [place], [place] - [address]  [address], [place] [address]

## 2021-03-23 NOTE — Telephone Encounter (Signed)
Requesting:Vyvanse Contract: UDS: Last Visit:02/18/21 Next Visit:n/a Last Refill:01/29/21 30 tabs 0 refills  Please Advise

## 2021-04-01 ENCOUNTER — Telehealth (HOSPITAL_COMMUNITY): Payer: Federal, State, Local not specified - PPO | Admitting: Psychiatry

## 2021-04-01 ENCOUNTER — Telehealth (HOSPITAL_COMMUNITY): Payer: Self-pay | Admitting: Psychiatry

## 2021-04-01 ENCOUNTER — Encounter (HOSPITAL_COMMUNITY): Payer: Self-pay

## 2021-04-01 ENCOUNTER — Other Ambulatory Visit: Payer: Self-pay

## 2021-04-01 NOTE — Telephone Encounter (Signed)
I called the patient at our scheduled appointment time. There was no answer. I left a voice message for patient to call the clinic back at their convinence.   

## 2021-04-06 ENCOUNTER — Ambulatory Visit: Payer: Federal, State, Local not specified - PPO | Admitting: Registered Nurse

## 2021-04-06 ENCOUNTER — Encounter: Payer: Self-pay | Admitting: Registered Nurse

## 2021-04-06 VITALS — BP 128/90 | HR 83 | Temp 98.3°F | Resp 18 | Ht 62.0 in | Wt 190.6 lb

## 2021-04-06 DIAGNOSIS — K2900 Acute gastritis without bleeding: Secondary | ICD-10-CM

## 2021-04-06 MED ORDER — ONDANSETRON 4 MG PO TBDP: 4.0000 mg | ORAL_TABLET | ORAL | 1 refills | Status: DC | PRN

## 2021-04-06 NOTE — Progress Notes (Signed)
Established Patient Office Visit  Subjective:  Patient ID: Jamie Lopez, female    DOB: 06-20-1995  Age: 25 y.o. MRN: [id number]  CC:  Chief Complaint  Patient presents with   GI Problem    Patient states she is still having the stomach issues and also is having some diarrhea and throwing up.    HPI Jamie Lopez presents for ongoing GI issues.  Seen virtually on 02/18/21 for hx of anemia and gastritis  Given pantoprazole and sucralfate These helped for a bit.   Still ongoing burning pain in epigastric area Notes she had not eating much Had tried some baking soda and water for relief, unfortunately this elicited vomiting and diarrhea  Vomited 3x ver 90 minutes Nausea for a few hours.  Diarrhea through Sunday.  Woke up Sunday feeling better, but slowly reintroduced food.  Notes she skipped dose of protonix on the day this happened Has stopped iron pills, NSAIDs, and other harsh medication.   Past Medical History:  Diagnosis Date   Anxiety    Asthma    Headache(784.0)    Lupus (HCC)    Rosacea    Seasonal allergies     Past Surgical History:  Procedure Laterality Date   NO PAST SURGERIES     WISDOM TOOTH EXTRACTION      Family History  Problem Relation Age of Onset   Anxiety disorder Mother    Diabetes Mellitus II Mother    Depression Mother    Anxiety disorder Maternal Grandfather    Depression Maternal Grandfather    Alcohol abuse Maternal Grandfather    Drug abuse Maternal Grandfather    Anxiety disorder Maternal Grandmother    Diabetes Mellitus II Maternal Grandmother    Depression Maternal Grandmother    Alcohol abuse Paternal Grandfather    Drug abuse Paternal Grandfather    Bipolar disorder Cousin    Schizophrenia Cousin    Drug abuse Father     Social History   Socioeconomic History   Marital status: Single    Spouse name: Not on file   Number of children: 0   Years of education: 14   Highest education level: Not on file   Occupational History   Occupation: student    Comment: [employer]  Tobacco Use   Smoking status: Light Smoker    Types: Cigarettes, Cigars    Last attempt to quit: 05/11/2013    Years since quitting: 7.9   Smokeless tobacco: Never   Tobacco comments:    once a year  Vaping Use   Vaping Use: Never used  Substance and Sexual Activity   Alcohol use: Yes    Alcohol/week: 2.0 standard drinks    Types: 2 Glasses of wine per week    Comment: social: 1-2x/week   Drug use: Not Currently    Types: Marijuana    Comment: quit in 2018   Sexual activity: Yes    Partners: Male    Birth control/protection: Condom  Other Topics Concern   Not on file  Social History Narrative   Pt lives in [place] with mom and brother. Pt goes to [employer] and pt is a sophomore. Pt was born in [place] and raised by mom. Pt has 4 siblings and 1 on the way on her dad's side. Childhood was nice and she mostly raised as an child.  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      Social Determinants of Health   Financial Resource Strain:  Not on file  Food Insecurity: Not on file  Transportation Needs: Not on file  Physical Activity: Not on file  Stress: Not on file  Social Connections: Not on file  Intimate Partner Violence: Not on file    Outpatient Medications Prior to Visit  Medication Sig Dispense Refill   albuterol (PROVENTIL HFA;VENTOLIN HFA) 108 (90 Base) MCG/ACT inhaler Inhale 1-2 puffs into the lungs every 6 (six) hours as needed for wheezing or shortness of breath. 1 Inhaler 0   budesonide (RHINOCORT ALLERGY) 32 MCG/ACT nasal spray Place 1-2 sprays into both nostrils daily. 8.6 g 6   busPIRone (BUSPAR) 15 MG tablet Take 0.5 tablets (7.5 mg total) by mouth 2 (two) times daily. 90 tablet 0   cetirizine (ZYRTEC) 10 MG tablet Take 1 tablet (10 mg total) by mouth daily. 90 tablet 3   diclofenac (VOLTAREN) 75 MG EC tablet Take 75 mg by mouth 2 (two) times daily.     etonogestrel-ethinyl  estradiol (NUVARING) 0.12-0.015 MG/24HR vaginal ring Place 1 each vaginally every 28 (twenty-eight) days. Insert vaginally and leave in place for 3 consecutive weeks, then remove for 1 week.     hydroxychloroquine (PLAQUENIL) 200 MG tablet Take 200 mg by mouth 2 (two) times daily.     hydrOXYzine (VISTARIL) 25 MG capsule TAKE 1 TO 2 CAPSULES BY MOUTH EVERY DAY AT BEDTIME AS NEEDED FOR INSOMNIA 180 capsule 0   lisdexamfetamine (VYVANSE) 30 MG capsule Take 1 capsule (30 mg total) by mouth daily. 30 capsule 0   meloxicam (MOBIC) 15 MG tablet Take 15 mg by mouth daily as needed for pain.     naproxen (NAPROSYN) 500 MG tablet Take 1 tablet (500 mg total) by mouth 2 (two) times daily. 30 tablet 0   Olopatadine HCl (PATADAY) 0.2 % SOLN Apply 1 drop to eye daily as needed. 2.5 mL 3   pantoprazole (PROTONIX) 40 MG tablet Take 1 tablet (40 mg total) by mouth daily. 30 tablet 3   propranolol (INDERAL) 10 MG tablet Take 1 tablet (10 mg total) by mouth 2 (two) times daily. 180 tablet 0   rizatriptan (MAXALT-MLT) 10 MG disintegrating tablet Take 1 tablet (10 mg total) by mouth as needed for migraine. May repeat in 2 hours if needed 90 tablet 1   sertraline (ZOLOFT) 100 MG tablet Take 2 tablets (200 mg total) by mouth daily. 180 tablet 0   sucralfate (CARAFATE) 1 g tablet Take 1 tablet (1 g total) by mouth 4 (four) times daily -  with meals and at bedtime. 40 tablet 0   triamcinolone cream (KENALOG) 0.1 % Apply 1 application topically 2 (two) times daily. 30 g 0   ondansetron (ZOFRAN-ODT) 4 MG disintegrating tablet Take 4 mg by mouth as needed.     No facility-administered medications prior to visit.    Allergies  Allergen Reactions   Fruit & Vegetable Daily [Nutritional Supplements]    Peanut-Containing Drug Products     Tree nut specifically    Other     ROS Review of Systems  Constitutional: Negative.   HENT: Negative.    Eyes: Negative.   Respiratory: Negative.    Cardiovascular: Negative.    Gastrointestinal:  Positive for abdominal pain, nausea and vomiting. Negative for abdominal distention, anal bleeding, blood in stool, constipation, diarrhea and rectal pain.  Genitourinary: Negative.   Musculoskeletal: Negative.   Skin: Negative.   Neurological: Negative.   Psychiatric/Behavioral: Negative.    All other systems reviewed and are negative.  Objective:    Physical Exam Vitals and nursing note reviewed.  Constitutional:      General: She is not in acute distress.    Appearance: Normal appearance. She is normal weight. She is not ill-appearing, toxic-appearing or diaphoretic.  Cardiovascular:     Rate and Rhythm: Normal rate and regular rhythm.     Heart sounds: Normal heart sounds. No murmur heard.   No friction rub. No gallop.  Pulmonary:     Effort: Pulmonary effort is normal. No respiratory distress.     Breath sounds: Normal breath sounds. No stridor. No wheezing, rhonchi or rales.  Chest:     Chest wall: No tenderness.  Abdominal:     General: Abdomen is flat. Bowel sounds are normal. There is no distension.     Palpations: Abdomen is soft. There is no mass.     Tenderness: There is no abdominal tenderness. There is no right CVA tenderness, left CVA tenderness, guarding or rebound.     Hernia: No hernia is present.  Skin:    General: Skin is warm and dry.  Neurological:     General: No focal deficit present.     Mental Status: She is alert and oriented to person, place, and time. Mental status is at baseline.  Psychiatric:        Mood and Affect: Mood normal.        Behavior: Behavior normal.        Thought Content: Thought content normal.        Judgment: Judgment normal.    BP 128/90   Pulse 83   Temp 98.3 F (36.8 C) (Temporal)   Resp 18   Ht 5\' 2"  (1.575 m)   Wt 190 lb 9.6 oz (86.5 kg)   SpO2 99%   BMI 34.86 kg/m  Wt Readings from Last 3 Encounters:  04/06/21 190 lb 9.6 oz (86.5 kg)  03/20/21 185 lb (83.9 kg)  01/29/21 181 lb 3.2 oz  (82.2 kg)     Health Maintenance Due  Topic Date Due   COVID-19 Vaccine (3 - Booster for Moderna series) 04/22/2020    There are no preventive care reminders to display for this patient.  Lab Results  Component Value Date   TSH 0.682 12/23/2019   Lab Results  Component Value Date   WBC 3.8 (L) 03/20/2021   HGB 11.9 (L) 03/20/2021   HCT 35.5 (L) 03/20/2021   MCV 88.3 03/20/2021   PLT 361 03/20/2021   Lab Results  Component Value Date   NA 136 03/20/2021   K 3.8 03/20/2021   CO2 21 (L) 03/20/2021   GLUCOSE 97 03/20/2021   BUN 9 03/20/2021   CREATININE 0.73 03/20/2021   BILITOT 0.3 03/20/2021   ALKPHOS 54 03/20/2021   AST 20 03/20/2021   ALT 16 03/20/2021   PROT 8.2 (H) 03/20/2021   ALBUMIN 3.5 03/20/2021   CALCIUM 8.7 (L) 03/20/2021   ANIONGAP 7 03/20/2021   Lab Results  Component Value Date   CHOL 157 12/23/2019   Lab Results  Component Value Date   HDL 48 12/23/2019   Lab Results  Component Value Date   LDLCALC 95 12/23/2019   Lab Results  Component Value Date   TRIG 70 12/23/2019   Lab Results  Component Value Date   CHOLHDL 3.3 12/23/2019   Lab Results  Component Value Date   HGBA1C 5.6 12/23/2019      Assessment & Plan:   Problem List Items Addressed This Visit  None Visit Diagnoses     Acute gastritis, presence of bleeding unspecified, unspecified gastritis type    -  Primary   Relevant Medications   ondansetron (ZOFRAN-ODT) 4 MG disintegrating tablet   Other Relevant Orders   H. pylori antibody, IgG   CBC   Iron, TIBC and Ferritin Panel   Ambulatory referral to Gastroenterology       Meds ordered this encounter  Medications   ondansetron (ZOFRAN-ODT) 4 MG disintegrating tablet    Sig: Take 1 tablet (4 mg total) by mouth as needed.    Dispense:  20 tablet    Refill:  1    Order Specific Question:   Supervising Provider    Answer:   Neva Seat, JEFFREY R [[id number]]    Follow-up: Return if symptoms worsen or fail to improve.    PLAN Zofran, pantoprazole for relief. Discussed lifestyle modification H pylori serology. Repeat cbc and iron studies.  Refer to GI Patient encouraged to call clinic with any questions, comments, or concerns.  Janeece Agee, NP

## 2021-04-06 NOTE — Patient Instructions (Addendum)
Ms. Haja Crego to see you!  Let's check on labs  Ok to use zofran as needed  GI referral in place - they'll call you  Let me know if you need anything re: work   Thanks  Rich   If you have lab work done today you will be contacted with your lab results within the next 2 weeks.  If you have not heard from [place] then please contact us. The fastest way to get your results is to register for My Chart.   IF you received an x-ray today, you will receive an invoice from [facility]. Please contact [facility] at [phone] with questions or concerns regarding your invoice.   IF you received labwork today, you will receive an invoice from [facility]. Please contact [facility] at [phone] with questions or concerns regarding your invoice.   Our billing staff will not be able to assist you with questions regarding bills from these companies.  You will be contacted with the lab results as soon as they are available. The fastest way to get your results is to activate your My Chart account. Instructions are located on the last page of this paperwork. If you have not heard from [place] regarding the results in 2 weeks, please contact this office.

## 2021-04-07 LAB — CBC
HCT: 35.2 % — ABNORMAL LOW (ref 36.0–46.0)
Hemoglobin: 11.8 g/dL — ABNORMAL LOW (ref 12.0–15.0)
MCHC: 33.6 g/dL (ref 30.0–36.0)
MCV: 87.9 fl (ref 78.0–100.0)
Platelets: 330 10*3/uL (ref 150.0–400.0)
RBC: 4 Mil/uL (ref 3.87–5.11)
RDW: 12.6 % (ref 11.5–15.5)
WBC: 2.9 10*3/uL — ABNORMAL LOW (ref 4.0–10.5)

## 2021-04-07 LAB — IRON,TIBC AND FERRITIN PANEL
%SAT: 15 % (calc) — ABNORMAL LOW (ref 16–45)
Ferritin: 32 ng/mL (ref 16–154)
Iron: 66 ug/dL (ref 40–190)
TIBC: 447 mcg/dL (calc) (ref 250–450)

## 2021-04-07 LAB — H. PYLORI ANTIBODY, IGG: H Pylori IgG: NEGATIVE

## 2021-04-28 ENCOUNTER — Encounter: Payer: Self-pay | Admitting: Registered Nurse

## 2021-04-29 NOTE — Telephone Encounter (Signed)
Johny Drilling,    I've tried to contract [employer] and won't be able to receive an appointment until January into February. Is there anyway to contact my insurance about getting this approval until then ?   Please advise

## 2021-05-13 ENCOUNTER — Other Ambulatory Visit: Payer: Self-pay | Admitting: Registered Nurse

## 2021-05-13 DIAGNOSIS — K2900 Acute gastritis without bleeding: Secondary | ICD-10-CM

## 2021-05-27 ENCOUNTER — Telehealth (HOSPITAL_BASED_OUTPATIENT_CLINIC_OR_DEPARTMENT_OTHER): Payer: Federal, State, Local not specified - PPO | Admitting: Psychiatry

## 2021-05-27 ENCOUNTER — Other Ambulatory Visit: Payer: Self-pay

## 2021-05-27 DIAGNOSIS — F411 Generalized anxiety disorder: Secondary | ICD-10-CM | POA: Diagnosis not present

## 2021-05-27 DIAGNOSIS — F99 Mental disorder, not otherwise specified: Secondary | ICD-10-CM

## 2021-05-27 DIAGNOSIS — F331 Major depressive disorder, recurrent, moderate: Secondary | ICD-10-CM | POA: Diagnosis not present

## 2021-05-27 DIAGNOSIS — F5105 Insomnia due to other mental disorder: Secondary | ICD-10-CM

## 2021-05-27 MED ORDER — SERTRALINE HCL 100 MG PO TABS
200.0000 mg | ORAL_TABLET | Freq: Every day | ORAL | 0 refills | Status: DC
Start: 2021-05-27 — End: 2021-08-26

## 2021-05-27 MED ORDER — BUSPIRONE HCL 15 MG PO TABS: 7.5000 mg | ORAL_TABLET | Freq: Two times a day (BID) | ORAL | 0 refills | Status: DC

## 2021-05-27 MED ORDER — PROPRANOLOL HCL 10 MG PO TABS: 10.0000 mg | ORAL_TABLET | Freq: Two times a day (BID) | ORAL | 0 refills | Status: DC

## 2021-05-27 NOTE — Progress Notes (Signed)
Virtual Visit via Video Note  I connected with Jamie Lopez on 05/27/21 at  2:30 PM EST by a video enabled telemedicine application and verified that I am speaking with the correct person using two identifiers.  Location: Patient: work Provider: office   I discussed the limitations of evaluation and management by telemedicine and the availability of in person appointments. The patient expressed understanding and agreed to proceed.  History of Present Illness: The holidays were a little hard because she was missing her dad. Despite that she did have a good Christmas with her family. For about 2-3 weeks she was depressed, anxious and grieving. During that time she was having low motivation and energy, spending all her time in bed and having anhedonia.  It started to get better around mid December. Now she is doing better. 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 She denies isolation, anhedonia and hopelessness. Her sleep is ok with Melatonin or Vistaril. She only takes the Vistaril 1-2x/week. Jamie Lopez is then able to sleep thru the night. Her energy is generally low due to her health and weather. Her appetite is good. Her concentration is poor and she notes around the time of her finals she had a lot of trouble focusing. Her anxiety is decreased significantly now that she is on break and has gotten into PA school. She is going to be working part time and spending time with her family. She denies SI/HI.    Observations/Objective: Psychiatric Specialty Exam: ROS  There were no vitals taken for this visit.There is no height or weight on file to calculate BMI.  General Appearance: Casual and Neat  Eye Contact:  Good  Speech:  Clear and Coherent and Normal Rate  Volume:  Normal  Mood:  Euthymic  Affect:  Full Range  Thought Process:  Goal Directed, Linear, and Descriptions of Associations: Intact  Orientation:  Full (Time, Place, and Person)  Thought Content:  Logical   Suicidal Thoughts:  No  Homicidal Thoughts:  No  Memory:  Immediate;   Good  Judgement:  Good  Insight:  Good  Psychomotor Activity:  Normal  Concentration:  Concentration: Good  Recall:  Good  Fund of Knowledge:  Good  Language:  Good  Akathisia:  No  Handed:  Right  AIMS (if indicated):     Assets:  Communication Skills Desire for Improvement Financial Resources/Insurance Housing Resilience Social Support Talents/Skills Transportation Vocational/Educational  ADL's:  Intact  Cognition:  WNL  Sleep:        Assessment and Plan: Depression screen [facility] 2/9 05/27/2021 04/06/2021 02/18/2021 01/29/2021 11/19/2020  Decreased Interest 0 1 2 1  0  Down, Depressed, Hopeless 1 1 2 1 1   PHQ - 2 Score 1 2 4 2 1   Altered sleeping - 2 1 2  -  Tired, decreased energy - 2 2 2  -  Change in appetite - 2 1 1  -  Feeling bad or failure about yourself  - 1 2 1  -  Trouble concentrating - 2 2 2  -  Moving slowly or fidgety/restless - 1 0 2 -  Suicidal thoughts - 0 0 0 -  PHQ-9 Score - 12 12 12  -  Difficult doing work/chores - Somewhat difficult Very difficult - -    Flowsheet Row [facility] from 03/20/2021 in [facility] EM[facility]GENC[facility] DEPARTMENT Video Visit from 02/18/2021 in [facility]  C-SSRS RISK CATEGORY No Risk  No Risk No Risk      - referred to [facility]. Her PCP tried prescribing Vyvanse but it was not covered by her insurance.   1. MDD (major depressive disorder), recurrent episode, moderate (HCC) - sertraline (ZOLOFT) 100 MG tablet; Take 2 tablets (200 mg total) by mouth daily.  Dispense: 180 tablet; Refill: 0  2. GAD (generalized anxiety disorder) - busPIRone (BUSPAR) 15 MG tablet; Take 0.5 tablets (7.5 mg total) by mouth 2 (two) times daily.  Dispense: 90 tablet; Refill: 0 - sertraline (ZOLOFT) 100 MG tablet; Take 2 tablets (200 mg  total) by mouth daily.  Dispense: 180 tablet; Refill: 0 - propranolol (INDERAL) 10 MG tablet; Take 1 tablet (10 mg total) by mouth 2 (two) times daily.  Dispense: 180 tablet; Refill: 0  3. Insomnia due to other mental disorder - continue Vistaril prn. No refill today as pt states she has enough   Follow Up Instructions: In 2-3 months or sooner if needed   I discussed the assessment and treatment plan with the patient. The patient was provided an opportunity to ask questions and all were answered. The patient agreed with the plan and demonstrated an understanding of the instructions.   The patient was advised to call back or seek an in-person evaluation if the symptoms worsen or if the condition fails to improve as anticipated.  I provided 16 minutes of non-face-to-face time during this encounter.   Oletta Darter, MD

## 2021-07-08 ENCOUNTER — Ambulatory Visit: Payer: Federal, State, Local not specified - PPO | Admitting: Registered Nurse

## 2021-07-13 ENCOUNTER — Other Ambulatory Visit: Payer: Self-pay

## 2021-07-13 ENCOUNTER — Ambulatory Visit (INDEPENDENT_AMBULATORY_CARE_PROVIDER_SITE_OTHER): Payer: BC Managed Care – PPO | Admitting: Registered Nurse

## 2021-07-13 ENCOUNTER — Encounter: Payer: Self-pay | Admitting: Registered Nurse

## 2021-07-13 VITALS — HR 87 | Temp 98.3°F | Resp 18 | Ht 62.0 in | Wt 193.0 lb

## 2021-07-13 DIAGNOSIS — R0602 Shortness of breath: Secondary | ICD-10-CM

## 2021-07-13 DIAGNOSIS — L659 Nonscarring hair loss, unspecified: Secondary | ICD-10-CM | POA: Diagnosis not present

## 2021-07-13 DIAGNOSIS — R5382 Chronic fatigue, unspecified: Secondary | ICD-10-CM

## 2021-07-13 LAB — CBC WITH DIFFERENTIAL/PLATELET
Basophils Absolute: 0 10*3/uL (ref 0.0–0.1)
Basophils Relative: 1.2 % (ref 0.0–3.0)
Eosinophils Absolute: 0.1 10*3/uL (ref 0.0–0.7)
Eosinophils Relative: 3.1 % (ref 0.0–5.0)
HCT: 35.2 % — ABNORMAL LOW (ref 36.0–46.0)
Hemoglobin: 11.6 g/dL — ABNORMAL LOW (ref 12.0–15.0)
Lymphocytes Relative: 39.4 % (ref 12.0–46.0)
Lymphs Abs: 1.4 10*3/uL (ref 0.7–4.0)
MCHC: 32.8 g/dL (ref 30.0–36.0)
MCV: 88.8 fl (ref 78.0–100.0)
Monocytes Absolute: 0.3 10*3/uL (ref 0.1–1.0)
Monocytes Relative: 8.5 % (ref 3.0–12.0)
Neutro Abs: 1.7 10*3/uL (ref 1.4–7.7)
Neutrophils Relative %: 47.8 % (ref 43.0–77.0)
Platelets: 298 10*3/uL (ref 150.0–400.0)
RBC: 3.96 Mil/uL (ref 3.87–5.11)
RDW: 13.1 % (ref 11.5–15.5)
WBC: 3.5 10*3/uL — ABNORMAL LOW (ref 4.0–10.5)

## 2021-07-13 LAB — HEMOGLOBIN A1C: Hgb A1c MFr Bld: 5.7 % (ref 4.6–6.5)

## 2021-07-13 LAB — B12 AND FOLATE PANEL
Folate: 12.3 ng/mL (ref >5.9)
Vitamin B-12: 234 pg/mL (ref 211–911)

## 2021-07-13 LAB — VITAMIN D 25 HYDROXY (VIT D DEFICIENCY, FRACTURES): VITD: 31.7 ng/mL (ref 30.00–100.00)

## 2021-07-13 LAB — TSH: TSH: 1.17 u[IU]/mL (ref 0.35–5.50)

## 2021-07-13 MED ORDER — ALBUTEROL SULFATE HFA 108 (90 BASE) MCG/ACT IN AERS: 1.0000 | INHALATION_SPRAY | Freq: Four times a day (QID) | RESPIRATORY_TRACT | 0 refills | Status: DC | PRN

## 2021-07-13 NOTE — Progress Notes (Signed)
Established Patient Office Visit  Subjective:  Patient ID: Jamie Lopez, female    DOB: 11-19-1995  Age: 26 y.o. MRN: [id number]  CC:  Chief Complaint  Patient presents with   Fatigue    Patient states for about a month she has noticed she is more exhausted even while getting a good night sleep. Patient states she is having moment where she is just dropping things and do not know why. Also when she sneeze , cough even while sitting sometimes she is having problems catching breath and having to use inhaler more.    HPI Jamie Lopez presents for fatigue  Ongoing x 1 mo. Chronic fatigue, nonrestorative sleep. Does note that she wakes frequently through the night - in the past was with dreams, now not as much Had been using melatonin and hydroxyzine but is trying to limit these.  Has moments where she loses grip strength and drops objects- bilateral.  Notes more frequent sneezing and coughing SHOB has been more frequent, more often using albuterol inhaler.   No bowel changes  She is concerned for a lupus flare.   Has been taking OTC vitamin d supplement, has not been taking iron as she has had constipation and upset stomach with this in the past.   Past Medical History:  Diagnosis Date   Anxiety    Asthma    Headache(784.0)    Lupus (HCC)    Rosacea    Seasonal allergies     Past Surgical History:  Procedure Laterality Date   NO PAST SURGERIES     WISDOM TOOTH EXTRACTION      Family History  Problem Relation Age of Onset   Anxiety disorder Mother    Diabetes Mellitus II Mother    Depression Mother    Anxiety disorder Maternal Grandfather    Depression Maternal Grandfather    Alcohol abuse Maternal Grandfather    Drug abuse Maternal Grandfather    Anxiety disorder Maternal Grandmother    Diabetes Mellitus II Maternal Grandmother    Depression Maternal Grandmother    Alcohol abuse Paternal Grandfather    Drug abuse Paternal Grandfather    Bipolar disorder  Cousin    Schizophrenia Cousin    Drug abuse Father     Social History   Socioeconomic History   Marital status: Single    Spouse name: Not on file   Number of children: 0   Years of education: 14   Highest education level: Not on file  Occupational History   Occupation: student    Comment: [employer]  Tobacco Use   Smoking status: Light Smoker    Types: Cigarettes, Cigars    Last attempt to quit: 05/11/2013    Years since quitting: 8.1   Smokeless tobacco: Never   Tobacco comments:    once a year  Vaping Use   Vaping Use: Never used  Substance and Sexual Activity   Alcohol use: Yes    Alcohol/week: 2.0 standard drinks    Types: 2 Glasses of wine per week    Comment: social: 1-2x/week   Drug use: Not Currently    Types: Marijuana    Comment: quit in 2018   Sexual activity: Yes    Partners: Male    Birth control/protection: Condom  Other Topics Concern   Not on file  Social History Narrative   Pt lives in [place] with mom and brother. Pt goes to [employer] and pt is a sophomore. Pt was born in [place] and  raised by mom. Pt has 4 siblings and 1 on the way on her dad's side. Childhood was nice and she mostly raised as an child.  Pt works as an Engineer, manufacturing systemsundergraduate TA. Never married, no kids.      Social Determinants of Health   Financial Resource Strain: Not on file  Food Insecurity: Not on file  Transportation Needs: Not on file  Physical Activity: Not on file  Stress: Not on file  Social Connections: Not on file  Intimate Partner Violence: Not on file    Outpatient Medications Prior to Visit  Medication Sig Dispense Refill   budesonide (RHINOCORT ALLERGY) 32 MCG/ACT nasal spray Place 1-2 sprays into both nostrils daily. 8.6 g 6   busPIRone (BUSPAR) 15 MG tablet Take 0.5 tablets (7.5 mg total) by mouth 2 (two) times daily. 90 tablet 0   cetirizine (ZYRTEC) 10 MG tablet Take 1 tablet (10 mg total) by mouth daily. 90 tablet 3   diclofenac (VOLTAREN) 75 MG EC tablet Take 75 mg  by mouth 2 (two) times daily.     etonogestrel-ethinyl estradiol (NUVARING) 0.12-0.015 MG/24HR vaginal ring Place 1 each vaginally every 28 (twenty-eight) days. Insert vaginally and leave in place for 3 consecutive weeks, then remove for 1 week.     hydroxychloroquine (PLAQUENIL) 200 MG tablet Take 200 mg by mouth 2 (two) times daily.     hydrOXYzine (VISTARIL) 25 MG capsule TAKE 1 TO 2 CAPSULES BY MOUTH EVERY DAY AT BEDTIME AS NEEDED FOR INSOMNIA 180 capsule 0   meloxicam (MOBIC) 15 MG tablet Take 15 mg by mouth daily as needed for pain.     naproxen (NAPROSYN) 500 MG tablet Take 1 tablet (500 mg total) by mouth 2 (two) times daily. 30 tablet 0   Olopatadine HCl (PATADAY) 0.2 % SOLN Apply 1 drop to eye daily as needed. 2.5 mL 3   ondansetron (ZOFRAN-ODT) 4 MG disintegrating tablet Take 1 tablet (4 mg total) by mouth as needed. 20 tablet 1   pantoprazole (PROTONIX) 40 MG tablet TAKE 1 TABLET BY MOUTH EVERY DAY 90 tablet 1   propranolol (INDERAL) 10 MG tablet Take 1 tablet (10 mg total) by mouth 2 (two) times daily. 180 tablet 0   rizatriptan (MAXALT-MLT) 10 MG disintegrating tablet Take 1 tablet (10 mg total) by mouth as needed for migraine. May repeat in 2 hours if needed 90 tablet 1   sertraline (ZOLOFT) 100 MG tablet Take 2 tablets (200 mg total) by mouth daily. 180 tablet 0   sucralfate (CARAFATE) 1 g tablet Take 1 tablet (1 g total) by mouth 4 (four) times daily -  with meals and at bedtime. 40 tablet 0   triamcinolone cream (KENALOG) 0.1 % Apply 1 application topically 2 (two) times daily. 30 g 0   albuterol (PROVENTIL HFA;VENTOLIN HFA) 108 (90 Base) MCG/ACT inhaler Inhale 1-2 puffs into the lungs every 6 (six) hours as needed for wheezing or shortness of breath. 1 Inhaler 0   No facility-administered medications prior to visit.    Allergies  Allergen Reactions   Fruit & Vegetable Daily [Nutritional Supplements]    Peanut-Containing Drug Products     Tree nut specifically    Other      ROS Review of Systems Per hpi     Objective:    Physical Exam Vitals and nursing note reviewed.  Constitutional:      General: She is not in acute distress.    Appearance: Normal appearance. She is normal weight. She is  not ill-appearing, toxic-appearing or diaphoretic.  Cardiovascular:     Rate and Rhythm: Normal rate and regular rhythm.     Heart sounds: Normal heart sounds. No murmur heard.   No friction rub. No gallop.  Pulmonary:     Effort: Pulmonary effort is normal. No respiratory distress.     Breath sounds: Normal breath sounds. No stridor. No wheezing, rhonchi or rales.  Chest:     Chest wall: No tenderness.  Skin:    General: Skin is warm and dry.  Neurological:     General: No focal deficit present.     Mental Status: She is alert and oriented to person, place, and time. Mental status is at baseline.  Psychiatric:        Mood and Affect: Mood normal.        Behavior: Behavior normal.        Thought Content: Thought content normal.        Judgment: Judgment normal.    Pulse 87    Temp 98.3 F (36.8 C) (Temporal)    Resp 18    Ht 5\' 2"  (1.575 m)    Wt 193 lb (87.5 kg)    SpO2 100%    BMI 35.30 kg/m  Wt Readings from Last 3 Encounters:  07/13/21 193 lb (87.5 kg)  04/06/21 190 lb 9.6 oz (86.5 kg)  03/20/21 185 lb (83.9 kg)     Health Maintenance Due  Topic Date Due   PAP-Cervical Cytology Screening  12/30/2019   COVID-19 Vaccine (3 - Booster for Moderna series) 04/22/2020    There are no preventive care reminders to display for this patient.  Lab Results  Component Value Date   TSH 0.682 12/23/2019   Lab Results  Component Value Date   WBC 2.9 (L) 04/06/2021   HGB 11.8 (L) 04/06/2021   HCT 35.2 (L) 04/06/2021   MCV 87.9 04/06/2021   PLT 330.0 04/06/2021   Lab Results  Component Value Date   NA 136 03/20/2021   K 3.8 03/20/2021   CO2 21 (L) 03/20/2021   GLUCOSE 97 03/20/2021   BUN 9 03/20/2021   CREATININE 0.73 03/20/2021   BILITOT  0.3 03/20/2021   ALKPHOS 54 03/20/2021   AST 20 03/20/2021   ALT 16 03/20/2021   PROT 8.2 (H) 03/20/2021   ALBUMIN 3.5 03/20/2021   CALCIUM 8.7 (L) 03/20/2021   ANIONGAP 7 03/20/2021   Lab Results  Component Value Date   CHOL 157 12/23/2019   Lab Results  Component Value Date   HDL 48 12/23/2019   Lab Results  Component Value Date   LDLCALC 95 12/23/2019   Lab Results  Component Value Date   TRIG 70 12/23/2019   Lab Results  Component Value Date   CHOLHDL 3.3 12/23/2019   Lab Results  Component Value Date   HGBA1C 5.6 12/23/2019      Assessment & Plan:   Problem List Items Addressed This Visit   None Visit Diagnoses     Chronic fatigue    -  Primary   Relevant Orders   CBC with Differential/Platelet   Comprehensive metabolic panel   Hemoglobin A1c   TSH   Vitamin D (25 hydroxy)   B12 and Folate Panel   Urinalysis, Routine w reflex microscopic   Hair loss       Relevant Orders   CBC with Differential/Platelet   Comprehensive metabolic panel   Hemoglobin A1c   TSH   Vitamin D (25 hydroxy)  B12 and Folate Panel   Urinalysis, Routine w reflex microscopic   Shortness of breath       Relevant Medications   albuterol (VENTOLIN HFA) 108 (90 Base) MCG/ACT inhaler       Meds ordered this encounter  Medications   albuterol (VENTOLIN HFA) 108 (90 Base) MCG/ACT inhaler    Sig: Inhale 1-2 puffs into the lungs every 6 (six) hours as needed for wheezing or shortness of breath.    Dispense:  1 each    Refill:  0    Follow-up: Return if symptoms worsen or fail to improve.   PLAN Possible flare of lupus.  Will check labs. If wnl, can consider prednisone and seeking early rheumatology follow up . Otherwise, treat as indicated. Patient encouraged to call clinic with any questions, comments, or concerns.  Janeece Agee, NP

## 2021-07-13 NOTE — Patient Instructions (Addendum)
°  Ms. Kritikos -   Jan Fireman on getting in to school - you must be psyched. Let's check labs and figure out what's happening.  I'll keep you in the loop  Thanks,  Rich    If you have lab work done today you will be contacted with your lab results within the next 2 weeks.  If you have not heard from [place] then please contact us. The fastest way to get your results is to register for My Chart.   IF you received an x-ray today, you will receive an invoice from [facility]. Please contact [facility] at [phone] with questions or concerns regarding your invoice.   IF you received labwork today, you will receive an invoice from [facility]. Please contact [facility] at [phone] with questions or concerns regarding your invoice.   Our billing staff will not be able to assist you with questions regarding bills from these companies.  You will be contacted with the lab results as soon as they are available. The fastest way to get your results is to activate your My Chart account. Instructions are located on the last page of this paperwork. If you have not heard from [place] regarding the results in 2 weeks, please contact this office.

## 2021-07-14 ENCOUNTER — Other Ambulatory Visit: Payer: Self-pay | Admitting: Registered Nurse

## 2021-07-14 ENCOUNTER — Encounter: Payer: Self-pay | Admitting: Registered Nurse

## 2021-07-14 DIAGNOSIS — R5382 Chronic fatigue, unspecified: Secondary | ICD-10-CM

## 2021-07-14 LAB — COMPREHENSIVE METABOLIC PANEL
AG Ratio: 0.9 (calc) — ABNORMAL LOW (ref 1.0–2.5)
ALT: 20 U/L (ref 6–29)
AST: 19 U/L (ref 10–30)
Albumin: 3.6 g/dL (ref 3.6–5.1)
Alkaline phosphatase (APISO): 68 U/L (ref 31–125)
BUN: 15 mg/dL (ref 7–25)
CO2: 17 mmol/L — ABNORMAL LOW (ref 20–32)
Calcium: 8.6 mg/dL (ref 8.6–10.2)
Chloride: 106 mmol/L (ref 98–110)
Creat: 0.69 mg/dL (ref 0.50–0.96)
Globulin: 3.8 g/dL (calc) — ABNORMAL HIGH (ref 1.9–3.7)
Glucose, Bld: 80 mg/dL (ref 65–99)
Potassium: 4.2 mmol/L (ref 3.5–5.3)
Sodium: 136 mmol/L (ref 135–146)
Total Bilirubin: 0.3 mg/dL (ref 0.2–1.2)
Total Protein: 7.4 g/dL (ref 6.1–8.1)

## 2021-07-14 LAB — URINALYSIS, ROUTINE W REFLEX MICROSCOPIC
Bilirubin Urine: NEGATIVE
Hgb urine dipstick: NEGATIVE
Ketones, ur: NEGATIVE
Leukocytes,Ua: NEGATIVE
Nitrite: NEGATIVE
RBC / HPF: NONE SEEN (ref 0–?)
Specific Gravity, Urine: 1.025 (ref 1.000–1.030)
Total Protein, Urine: NEGATIVE
Urine Glucose: NEGATIVE
Urobilinogen, UA: 0.2 (ref 0.0–1.0)
pH: 6 (ref 5.0–8.0)

## 2021-07-14 MED ORDER — PREDNISONE 10 MG (21) PO TBPK: ORAL_TABLET | ORAL | 0 refills | Status: DC

## 2021-08-14 ENCOUNTER — Other Ambulatory Visit: Payer: Self-pay | Admitting: Registered Nurse

## 2021-08-14 DIAGNOSIS — R0602 Shortness of breath: Secondary | ICD-10-CM

## 2021-08-24 ENCOUNTER — Telehealth: Payer: Self-pay

## 2021-08-24 NOTE — Telephone Encounter (Addendum)
Patient is calling in requesting for a lab TB test.  ?

## 2021-08-25 ENCOUNTER — Other Ambulatory Visit: Payer: Self-pay | Admitting: Registered Nurse

## 2021-08-25 ENCOUNTER — Other Ambulatory Visit: Payer: BC Managed Care – PPO

## 2021-08-25 DIAGNOSIS — Z111 Encounter for screening for respiratory tuberculosis: Secondary | ICD-10-CM

## 2021-08-25 NOTE — Telephone Encounter (Signed)
Patient can be put on the schedule for TB screening. ?

## 2021-08-25 NOTE — Telephone Encounter (Signed)
Patient wants to know if she can have some orders to go get TB testing done at [facility] ?

## 2021-08-25 NOTE — Telephone Encounter (Signed)
Patient is scheduled   

## 2021-08-25 NOTE — Telephone Encounter (Signed)
Order is in for quantiferon gold at [facility]. ? ?Thanks, ? ?Rich

## 2021-08-25 NOTE — Telephone Encounter (Signed)
Patient is calling in wanting to know if she can have the order released in her chart so she can go to [facility]. Asked for a call back if possible.  ?

## 2021-08-26 ENCOUNTER — Encounter (HOSPITAL_COMMUNITY): Payer: Self-pay | Admitting: Psychiatry

## 2021-08-26 ENCOUNTER — Telehealth (HOSPITAL_BASED_OUTPATIENT_CLINIC_OR_DEPARTMENT_OTHER): Payer: Federal, State, Local not specified - PPO | Admitting: Psychiatry

## 2021-08-26 DIAGNOSIS — F5105 Insomnia due to other mental disorder: Secondary | ICD-10-CM

## 2021-08-26 DIAGNOSIS — F99 Mental disorder, not otherwise specified: Secondary | ICD-10-CM

## 2021-08-26 DIAGNOSIS — F331 Major depressive disorder, recurrent, moderate: Secondary | ICD-10-CM

## 2021-08-26 DIAGNOSIS — F411 Generalized anxiety disorder: Secondary | ICD-10-CM

## 2021-08-26 MED ORDER — HYDROXYZINE PAMOATE 25 MG PO CAPS: ORAL_CAPSULE | ORAL | 0 refills | Status: DC

## 2021-08-26 MED ORDER — SERTRALINE HCL 100 MG PO TABS: 200.0000 mg | ORAL_TABLET | Freq: Every day | ORAL | 0 refills | Status: DC

## 2021-08-26 MED ORDER — BUSPIRONE HCL 15 MG PO TABS: 7.5000 mg | ORAL_TABLET | Freq: Two times a day (BID) | ORAL | 0 refills | Status: DC

## 2021-08-26 MED ORDER — PROPRANOLOL HCL 10 MG PO TABS: 10.0000 mg | ORAL_TABLET | Freq: Two times a day (BID) | ORAL | 0 refills | Status: DC

## 2021-08-26 NOTE — Progress Notes (Signed)
Virtual Visit via Video Note ? ?I connected with Jamie Lopez on 08/26/21 at  3:30 PM EDT by a video enabled telemedicine application and verified that I am speaking with the correct person using two identifiers. ? ?Location: ?Patient: getting pedicure ?Provider: office ?  ?I discussed the limitations of evaluation and management by telemedicine and the availability of in person appointments. The patient expressed understanding and agreed to proceed. ? ?History of Present Illness: ?Jamie Lopez has been ok. She forgot about today's appointment and is currently getting a pedicure. She opted to continue our session. 2 weeks ago was her dad's death anniversary. Jamie Lopez was with her family and notes she didn't feel like crying.  It made her realize she is forgetting a lot of things. She is not motivated with some school stuff. 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 Often it feels she is just moving thru out the day. Her sleep ""is not great". Jamie Lopez rarely takes Vistaril because she thinks about it very late in the night. She often has racing thoughts when she lies down. She has anxiety but feels it is manageable. Jamie Lopez denies depression. She denies isolation and anhedonia. She denies SI/HI. Jamie Lopez can tell when she misses a dose of Propranolol. It takes a few weeks before she notices a change in her mood if she misses her Zoloft. She sometimes feels numb with Zoloft. Buspar does help with her anxiety.  ?  ?Observations/Objective: ?Psychiatric Specialty Exam: ?ROS  ?There were no vitals taken for this visit.There is no height or weight on file to calculate BMI.  ?General Appearance: Casual and Fairly Groomed  ?Eye Contact:  Good  ?Speech:  Clear and Coherent and Normal Rate  ?Volume:  Normal  ?Mood:  Euthymic  ?Affect:  Full Range  ?Thought Process:  Goal Directed, Linear, and Descriptions of Associations: Intact  ?Orientation:  Full (Time, Place, and Person)  ?Thought Content:  Logical  ?Suicidal Thoughts:  No  ?Homicidal  Thoughts:  No  ?Memory:  Immediate;   Good  ?Judgement:  Good  ?Insight:  Good  ?Psychomotor Activity:  Normal  ?Concentration:  Concentration: Good  ?Recall:  Good  ?Fund of Knowledge:  Good  ?Language:  Good  ?Akathisia:  No  ?Handed:  Right  ?AIMS (if indicated):     ?Assets:  Communication Skills ?Desire for Improvement ?Financial Resources/Insurance ?Housing ?Leisure Time ?Resilience ?Social Support ?Talents/Skills ?Transportation ?Vocational/Educational  ?ADL's:  Intact  ?Cognition:  WNL  ?Sleep:     ? ? ? ?Assessment and Plan: ? ? ?  08/26/2021  ?  3:46 PM 07/13/2021  ? 11:01 AM 05/27/2021  ?  2:36 PM 04/06/2021  ?  3:01 PM 02/18/2021  ?  3:24 PM  ?Depression screen PHQ 2/9  ?Decreased Interest 0 1 0 1 2  ?Down, Depressed, Hopeless 0 1 1 1 2   ?PHQ - 2 Score 0 2 1 2 4   ?Altered sleeping  2  2 1   ?Tired, decreased energy  2  2 2   ?Change in appetite  2  2 1   ?Feeling bad or failure about yourself   0  1 2  ?Trouble concentrating  2  2 2   ?Moving slowly or fidgety/restless  2  1 0  ?Suicidal thoughts  0  0 0  ?PHQ-9 Score  12  12 12   ?Difficult doing work/chores  Not difficult at all  Somewhat difficult Very difficult  ? ? ?Flowsheet Row Video [facility]26/2021 [facility] Visit from 05/27/2021 in [facility] from 03/20/2021 in [facility] EMERGENCY DEPARTMENT  ?C-SSRS RISK CATEGORY No Risk No Risk No Risk  ? ?  ? ? ? ?The risk of un-intended pregnancy is low based on the fact that pt reports she has an IUD. Pt is aware that these meds carry a teratogenic risk. Pt will discuss plan of action if she does or plans to become pregnant in the future. ? ?Status of current problems: stable ? ?Meds:  ?1. GAD (generalized anxiety disorder) ?- busPIRone (BUSPAR) 15 MG tablet; Take 0.5 tablets (7.5 mg total) by mouth 2 (two) times daily.  Dispense: 90 tablet; Refill: 0 ?- propranolol (INDERAL) 10 MG tablet; Take 1  tablet (10 mg total) by mouth 2 (two) times daily.  Dispense: 180 tablet; Refill: 0 ?- sertraline (ZOLOFT) 100 MG tablet; Take 2 tablets (200 mg total) by mouth daily.  Dispense: 180 tablet; Refill: 0 ? ?2. MDD (major depressive disorder), recurrent episode, moderate (HCC) ?- sertraline (ZOLOFT) 100 MG tablet; Take 2 tablets (200 mg total) by mouth daily.  Dispense: 180 tablet; Refill: 0 ? ?3. Insomnia due to other mental disorder ?- hydrOXYzine (VISTARIL) 25 MG capsule; TAKE 1 TO 2 CAPSULES BY MOUTH EVERY DAY AT BEDTIME AS NEEDED FOR INSOMNIA  Dispense: 180 capsule; Refill: 0 ? ? ? ? ? ?Therapy: brief supportive therapy provided. Discussed psychosocial stressors in detail. ? ?Collaboration of Care: Other none today ? ?Patient/Guardian was advised Release of Information must be obtained prior to any record release in order to collaborate their care with an outside provider. Patient/Guardian was advised if they have not already done so to contact the registration department to sign all necessary forms in order for [place] to release information regarding their care.  ? ?Consent: Patient/Guardian gives verbal consent for treatment and assignment of benefits for services provided during this visit. Patient/Guardian expressed understanding and agreed to proceed.  ? ? ?Follow Up Instructions: ?Follow up in 3 months or sooner if needed ?  ? ?I discussed the assessment and treatment plan with the patient. The patient was provided an opportunity to ask questions and all were answered. The patient agreed with the plan and demonstrated an understanding of the instructions. ?  ?The patient was advised to call back or seek an in-person evaluation if the symptoms worsen or if the condition fails to improve as anticipated. ? ?I provided 12 minutes of non-face-to-face time during this encounter. ? ? ?Oletta Darter, MD ? ?

## 2021-08-27 NOTE — Telephone Encounter (Signed)
Called patient to let her know that the order was put in for patient ?

## 2021-09-08 LAB — HM PAP SMEAR: HM Pap smear: NORMAL

## 2021-09-13 ENCOUNTER — Other Ambulatory Visit: Payer: Self-pay | Admitting: Registered Nurse

## 2021-09-13 DIAGNOSIS — Z111 Encounter for screening for respiratory tuberculosis: Secondary | ICD-10-CM | POA: Diagnosis not present

## 2021-09-15 DIAGNOSIS — L659 Nonscarring hair loss, unspecified: Secondary | ICD-10-CM | POA: Diagnosis not present

## 2021-09-15 DIAGNOSIS — M329 Systemic lupus erythematosus, unspecified: Secondary | ICD-10-CM | POA: Diagnosis not present

## 2021-09-15 DIAGNOSIS — M2550 Pain in unspecified joint: Secondary | ICD-10-CM | POA: Diagnosis not present

## 2021-09-16 LAB — QUANTIFERON-TB GOLD PLUS
QuantiFERON Mitogen Value: 7.67 IU/mL
QuantiFERON Nil Value: 0 IU/mL
QuantiFERON TB1 Ag Value: 0 IU/mL
QuantiFERON TB2 Ag Value: 0 IU/mL
QuantiFERON-TB Gold Plus: NEGATIVE

## 2021-10-04 DIAGNOSIS — Z113 Encounter for screening for infections with a predominantly sexual mode of transmission: Secondary | ICD-10-CM | POA: Diagnosis not present

## 2021-10-04 DIAGNOSIS — Z1159 Encounter for screening for other viral diseases: Secondary | ICD-10-CM | POA: Diagnosis not present

## 2021-10-04 DIAGNOSIS — Z6837 Body mass index (BMI) 37.0-37.9, adult: Secondary | ICD-10-CM | POA: Diagnosis not present

## 2021-10-04 DIAGNOSIS — Z01419 Encounter for gynecological examination (general) (routine) without abnormal findings: Secondary | ICD-10-CM | POA: Diagnosis not present

## 2021-10-14 ENCOUNTER — Other Ambulatory Visit: Payer: Self-pay

## 2021-10-14 ENCOUNTER — Ambulatory Visit: Payer: BC Managed Care – PPO | Admitting: Plastic Surgery

## 2021-10-14 VITALS — BP 113/70 | HR 88 | Ht 61.0 in | Wt 200.0 lb

## 2021-10-14 DIAGNOSIS — N62 Hypertrophy of breast: Secondary | ICD-10-CM

## 2021-10-14 DIAGNOSIS — M4004 Postural kyphosis, thoracic region: Secondary | ICD-10-CM | POA: Diagnosis not present

## 2021-10-14 DIAGNOSIS — M546 Pain in thoracic spine: Secondary | ICD-10-CM | POA: Diagnosis not present

## 2021-10-14 DIAGNOSIS — M545 Low back pain, unspecified: Secondary | ICD-10-CM

## 2021-10-14 DIAGNOSIS — R21 Rash and other nonspecific skin eruption: Secondary | ICD-10-CM

## 2021-10-14 NOTE — Progress Notes (Signed)
Referring Provider Maximiano Coss, NP [address] [place],  [place] [address]   CC: No chief complaint on file.     Jamie Lopez is an 26 y.o. female.  HPI: Patient presents to discuss breast reduction.  She had years of back pain, neck pain and shoulder grooving related to her large breast.  She tried over-the-counter medications, warm packs, cold packs supportive bras with little relief.  She also gets rashes beneath her breast that been refractory to over-the-counter prescription treatments.  She is currently a G cup and wants to be around a B or C cup.  No family history of breast cancer.  No previous breast biopsies or procedures herself.  She does not smoke and is not a diabetic.  She has not been to physical therapy.  She does have lupus and takes Plaquenil for that.  Allergies  Allergen Reactions   Fruit & Vegetable Daily [Nutritional Supplements]    Peanut-Containing Drug Products     Tree nut specifically    Other     Outpatient Encounter Medications as of 10/14/2021  Medication Sig Note   budesonide (RHINOCORT ALLERGY) 32 MCG/ACT nasal spray Place 1-2 sprays into both nostrils daily.    busPIRone (BUSPAR) 15 MG tablet Take 0.5 tablets (7.5 mg total) by mouth 2 (two) times daily.    cetirizine (ZYRTEC) 10 MG tablet Take 1 tablet (10 mg total) by mouth daily.    diclofenac (VOLTAREN) 75 MG EC tablet Take 75 mg by mouth 2 (two) times daily. 01/29/2021: PRN   etonogestrel-ethinyl estradiol (NUVARING) 0.12-0.015 MG/24HR vaginal ring Place 1 each vaginally every 28 (twenty-eight) days. Insert vaginally and leave in place for 3 consecutive weeks, then remove for 1 week.    hydroxychloroquine (PLAQUENIL) 200 MG tablet Take 200 mg by mouth 2 (two) times daily.    hydrOXYzine (VISTARIL) 25 MG capsule TAKE 1 TO 2 CAPSULES BY MOUTH EVERY DAY AT BEDTIME AS NEEDED FOR INSOMNIA    meloxicam (MOBIC) 15 MG tablet Take 15 mg by mouth daily as needed for pain. 01/29/2021: PRN   naproxen  (NAPROSYN) 500 MG tablet Take 1 tablet (500 mg total) by mouth 2 (two) times daily.    Olopatadine HCl (PATADAY) 0.2 % SOLN Apply 1 drop to eye daily as needed.    ondansetron (ZOFRAN-ODT) 4 MG disintegrating tablet Take 1 tablet (4 mg total) by mouth as needed.    propranolol (INDERAL) 10 MG tablet Take 1 tablet (10 mg total) by mouth 2 (two) times daily.    rizatriptan (MAXALT-MLT) 10 MG disintegrating tablet Take 1 tablet (10 mg total) by mouth as needed for migraine. May repeat in 2 hours if needed    sertraline (ZOLOFT) 100 MG tablet Take 2 tablets (200 mg total) by mouth daily.    sucralfate (CARAFATE) 1 g tablet Take 1 tablet (1 g total) by mouth 4 (four) times daily -  with meals and at bedtime.    triamcinolone cream (KENALOG) 0.1 % Apply 1 application topically 2 (two) times daily.    VENTOLIN HFA 108 (90 Base) MCG/ACT inhaler INHALE 1-2 PUFFS BY MOUTH EVERY 6 HOURS AS NEEDED FOR WHEEZE OR SHORTNESS OF BREATH    No facility-administered encounter medications on file as of 10/14/2021.     Past Medical History:  Diagnosis Date   Anxiety    Asthma    Headache(784.0)    Lupus ([facility])    Rosacea    Seasonal allergies     Past  Surgical History:  Procedure Laterality Date   NO PAST SURGERIES     WISDOM TOOTH EXTRACTION      Family History  Problem Relation Age of Onset   Anxiety disorder Mother    Diabetes Mellitus II Mother    Depression Mother    Anxiety disorder Maternal Grandfather    Depression Maternal Grandfather    Alcohol abuse Maternal Grandfather    Drug abuse Maternal Grandfather    Anxiety disorder Maternal Grandmother    Diabetes Mellitus II Maternal Grandmother    Depression Maternal Grandmother    Alcohol abuse Paternal Grandfather    Drug abuse Paternal Grandfather    Bipolar disorder Cousin    Schizophrenia Cousin    Drug abuse Father     Social History   Social History Narrative   Pt lives in [place] with mom and brother. Pt goes to [employer] and pt is a  sophomore. Pt was born in [place] and raised by mom. Pt has 4 siblings and 1 on the way on her dad's side. Childhood was nice and she mostly raised as an child.  Pt works as an Pharmacist, community. Never married, no kids.        Review of Systems General: Denies fevers, chills, weight loss CV: Denies chest pain, shortness of breath, palpitations  Physical Exam    07/13/2021   10:59 AM 04/06/2021    3:02 PM 03/21/2021    1:27 AM  Vitals with BMI  Height 5\' 2"  5\' 2"    Weight 193 lbs 190 lbs 10 oz   BMI 0000000 0000000   Systolic  0000000 [id number]  Diastolic  90 78  Pulse 87 83 88    General:  No acute distress,  Alert and oriented, Non-Toxic, Normal speech and affect Breast: She has grade 3 ptosis.  Sternal notch to nipple is 34 cm bilaterally.  Nipple to fold is 20 cm bilaterally.  No obvious scars or masses.  Assessment/Plan The patient has bilateral symptomatic macromastia.  She is a good candidate for a breast reduction.  She is interested in pursuing surgical treatment.  She has tried supportive garments and fitted bras with no relief.  The details of breast reduction surgery were discussed.  I explained the procedure in detail along the with the expected scars.  The risks were discussed in detail and include bleeding, infection, damage to surrounding structures, need for additional procedures, nipple loss, change in nipple sensation, persistent pain, contour irregularities and asymmetries.  I explained that breast feeding is often not possible after breast reduction surgery.  We discussed the expected postoperative course with an overall recovery period of about 1 month.  She demonstrated full understanding of all risks.  We discussed her personal risk factors.  The patient is interested in pursuing surgical treatment.  I anticipate approximately 600g of tissue removed from each side.  We will plan to send her for physical therapy and then submit for insurance approval after that  completed.   Cindra Presume 10/14/2021, 2:25 PM

## 2021-10-30 ENCOUNTER — Ambulatory Visit: Payer: BC Managed Care – PPO | Attending: Plastic Surgery | Admitting: Rehabilitative and Restorative Service Providers"

## 2021-10-30 ENCOUNTER — Encounter: Payer: Self-pay | Admitting: Rehabilitative and Restorative Service Providers"

## 2021-10-30 DIAGNOSIS — R293 Abnormal posture: Secondary | ICD-10-CM | POA: Diagnosis not present

## 2021-10-30 NOTE — Therapy (Signed)
[place], [place], [address] Phone: [phone]   Fax:  [phone]  Patient Details  Name: Jamie Lopez MRN: [id number] Date of Birth: Jun 17, 1995 Referring Provider:  Cindra Presume, MD  Encounter Date: 10/30/2021   OUTPATIENT PHYSICAL THERAPY THORACOLUMBAR EVALUATION   Patient Name: Jamie Lopez MRN: [id number] DOB:02-16-1996, 26 y.o., female Today's Date: 10/30/2021   PT End of Session - 10/30/21 0901     Visit Number 1    Number of Visits 10    Date for PT Re-Evaluation 12/11/21    PT Start Time 0809    PT Stop Time 0901    PT Time Calculation (min) 52 min    Activity Tolerance Patient tolerated treatment well;No increased pain    Behavior During Therapy WFL for tasks assessed/performed             Past Medical History:  Diagnosis Date   Anxiety    Asthma    Headache(784.0)    Lupus ([facility] [place])    Rosacea    Seasonal allergies    Past Surgical History:  Procedure Laterality Date   NO PAST SURGERIES     WISDOM TOOTH EXTRACTION     Patient Active Problem List   Diagnosis Date Noted   Lupus ([facility]) 07/16/2020   GAD (generalized anxiety disorder) 10/25/2012   Migraines 10/25/2012   Migraine 05/28/2011    PCP: Maximiano Coss, NP  REFERRING PROVIDER: Mingo Amber, MD  REFERRING DIAG:  N62 (ICD-10-CM) - Macromastia  M54.50,M54.6 (ICD-10-CM) - Back pain of thoracolumbar region  M40.04 (ICD-10-CM) - Postural kyphosis, thoracic region    Rationale for Evaluation and Treatment Rehabilitation  THERAPY DIAG:  Abnormal posture  ONSET DATE: Jan 2020  SUBJECTIVE:                                                                                                                                                                                           SUBJECTIVE STATEMENT: I have been heavy chested my whole life and had to wear multiple bras. Extra movement causes muscle pain. I don't  like to do a lot of things that require me to move a lot.  PERTINENT HISTORY:  Per chart review, pt has had years of back, neck and shoulder pain related to large breasts. She is a G cup. She does have lupus. 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   PAIN:  Are you having pain? Yes: NPRS scale: 8-9/10 Pain location: R pec area Pain description: stretching/sharp pain Aggravating factors: sidelying Relieving factors: changing position   PRECAUTIONS: None  WEIGHT BEARING RESTRICTIONS No  FALLS:  Has patient fallen in last 6 months? No  LIVING ENVIRONMENT: Lives with: lives with their family Lives in: House/apartment   OCCUPATION: is a full-time PA student  PLOF: Independent  PATIENT GOALS to be able to have less pain   OBJECTIVE:   DIAGNOSTIC FINDINGS:  N/a  PATIENT SURVEYS:  Modified Oswestry score 16   SCREENING FOR RED FLAGS: Bowel or bladder incontinence: No Spinal tumors: No Cauda equina syndrome: No Compression fracture: No Abdominal aneurysm: No  COGNITION:  Overall cognitive status: Within functional limits for tasks assessed     SENSATION: WFL  POSTURE: rounded shoulders  PALPATION: R thoracic paraspinals tighter than left, T2 and T7 more hypomobile, difficulty with R periscapular muscle activation  SENSATION: Left UE radicular sxs reported which increases with exertion such as carrying bookbag or wearing seatbelt. Starts supraclavicular to elbow region frontal; L pec tighter  CERVICAL ROM:   Active  A/PROM  eval  Flexion WFL  Extension WFL  Right lateral flexion WFL  Left lateral flexion WFL  Right rotation WFL  Left rotation WFL   (Blank rows = not tested)  SHOULDER AROM WFL  SHOULDER STRENGTH: 5/5 THROUGHOUT  CERVICAL/UE SPECIAL TESTS: Spurlings/compression/distraction all -; Median nerve test L questionable due to pt reports of just feeling "different" but without burning/tingling  reported.   TODAY'S TREATMENT  10/30/21: Body mechanics training and maintaining scapular retract while performing lifting of cadavers; HEP; doing HEP in front of a mirror; advised pt she should not have any increased pain but may experience muscle soreness along periscapular muscles with proper muscle activation   PATIENT EDUCATION:  Education details: Dietitian, HEP Person educated: Patient Education method: Explanation, Demonstration, Tactile cues, Verbal cues, and Handouts Education comprehension: verbalized understanding and returned demonstration   HOME EXERCISE PROGRAM: Access Code: [email or web address] URL: [email or web address] Date: 10/30/2021 Prepared by: America Brown  Exercises - Seated Scapular Retraction  - 2 x daily - 7 x weekly - 1 sets - 10 reps unilat and bil - Corner Pec Major Stretch  - 2 x daily - 7 x weekly - 1 sets - 3 reps - 30 sec hold  ASSESSMENT:  CLINICAL IMPRESSION: Patient is a 26 y.o. female who was seen today for physical therapy evaluation and treatment for posture, back, neck and shoulder pain related to large breasts. She demonstrates poor posture with rounded shoulders and radicular sxs of L UE. Pt also has increased difficulty with proper periscapular muscle recruitment R. Lastly, she demonstrates R thoracic paraspinals tighter than left, and T2 and T7 more hypomobile. Pt would benefit from PT for postural education and flexibility/strengthening exercises to assist with postural correction and correction of the above.    OBJECTIVE IMPAIRMENTS hypomobility, increased muscle spasms, impaired flexibility, improper body mechanics, postural dysfunction, and pain.   ACTIVITY LIMITATIONS carrying, lifting, sitting, standing, and school activities such as carrying bookbag, wearing seatbelt without pain  PARTICIPATION LIMITATIONS: community activity and school  PERSONAL FACTORS 1 comorbidity: large breasts  are also affecting patient's  functional outcome.   REHAB POTENTIAL: Good  CLINICAL DECISION MAKING: Stable/uncomplicated  EVALUATION COMPLEXITY: Low   GOALS: Goals reviewed with patient? Yes   LONG TERM GOALS: Target date:  12/11/21  Pt will be able to sit 1 hour with </=4/10 thoracic pain to eat dinner.  Baseline: 7/10 Goal status: INITIAL  2.  Pt will be able to perform cardio with 25% less difficulty for 20 min.  Baseline: 70-80% difficulty Goal  status: INITIAL  3.  Pt will be indep with HEP to assist with pain management and correction of postural dysfunction Baseline: issued at eval Goal status: INITIAL  4.  Pt will be able to carry bookbag with less L shoulder pain x 50% of the time Baseline: unable Goal status: INITIAL     PLAN: PT FREQUENCY: 1-2x/week  PT DURATION: 6 weeks  PLANNED INTERVENTIONS: Therapeutic exercises, Therapeutic activity, Patient/Family education, Joint mobilization, Dry Needling, Moist heat, Taping, Ultrasound, and Manual therapy.  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 [place], [place], [address] Phone: [phone]   Fax:  [phone]

## 2021-11-06 ENCOUNTER — Encounter: Payer: Self-pay | Admitting: Rehabilitative and Restorative Service Providers"

## 2021-11-06 ENCOUNTER — Ambulatory Visit: Payer: BC Managed Care – PPO | Admitting: Rehabilitative and Restorative Service Providers"

## 2021-11-06 DIAGNOSIS — R293 Abnormal posture: Secondary | ICD-10-CM | POA: Diagnosis not present

## 2021-11-06 NOTE — Therapy (Signed)
[facility] [address] [place], [place], [address] Phone: [phone]   Fax:  [phone]  Patient Details  Name: Jamie Lopez MRN: [id number] Date of Birth: 1995/10/12 Referring Provider:  Janeece Agee, NP  Encounter Date: 11/06/2021   OUTPATIENT PHYSICAL THERAPY TREATMENT NOTE   Patient Name: Jamie Lopez MRN: [id number] DOB:May 27, 1996, 26 y.o., female Today's Date: 11/06/2021  PCP: Janeece Agee, NP REFERRING PROVIDER: Merry Proud, MD    PT End of Session - 11/06/21 1030     Visit Number 2    Number of Visits 10    Date for PT Re-Evaluation 12/11/21    PT Start Time 0953    PT Stop Time 1035    PT Time Calculation (min) 42 min    Activity Tolerance Patient tolerated treatment well;No increased pain    Behavior During Therapy WFL for tasks assessed/performed             Past Medical History:  Diagnosis Date   Anxiety    Asthma    Headache(784.0)    Lupus ([facility])    Rosacea    Seasonal allergies    Past Surgical History:  Procedure Laterality Date   NO PAST SURGERIES     WISDOM TOOTH EXTRACTION     Patient Active Problem List   Diagnosis Date Noted   Lupus ([facility]) 07/16/2020   GAD (generalized anxiety disorder) 10/25/2012   Migraines 10/25/2012   Migraine 05/28/2011     REFERRING DIAG:  N62 (ICD-10-CM) - Macromastia  M54.50,M54.6 (ICD-10-CM) - Back pain of thoracolumbar region  M40.04 (ICD-10-CM) - Postural kyphosis, thoracic region    THERAPY DIAG:  Abnormal posture  Rationale for Evaluation and Treatment Rehabilitation  PERTINENT HISTORY: Per chart review, pt has had years of back, neck and shoulder pain related to large breasts. She is a G cup. She does have lupus. 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   PRECAUTIONS: none  SUBJECTIVE: I felt the difference lifting. I think watching my posture has helped.   PAIN:  Are you  having pain? No   OBJECTIVE:    DIAGNOSTIC FINDINGS:  N/a   PATIENT SURVEYS:  Modified Oswestry score 16    SCREENING FOR RED FLAGS: Bowel or bladder incontinence: No Spinal tumors: No Cauda equina syndrome: No Compression fracture: No Abdominal aneurysm: No   COGNITION:            Overall cognitive status: Within functional limits for tasks assessed                          SENSATION: WFL   POSTURE: rounded shoulders   PALPATION: R thoracic paraspinals tighter than left, T2 and T7 more hypomobile, difficulty with R periscapular muscle activation   SENSATION: Left UE radicular sxs reported which increases with exertion such as carrying bookbag or wearing seatbelt. Starts supraclavicular to elbow region frontal; L pec tighter   CERVICAL ROM:    Active  A/PROM  eval  Flexion WFL  Extension WFL  Right lateral flexion WFL  Left lateral flexion WFL  Right rotation WFL  Left rotation WFL   (Blank rows = not tested)   SHOULDER AROM WFL   SHOULDER STRENGTH: 5/5 THROUGHOUT   CERVICAL/UE SPECIAL TESTS: Spurlings/compression/distraction all -; Median nerve test L questionable due to pt reports of just feeling "different" but without burning/tingling reported.    TODAY'S TREATMENT  10/30/21: Body mechanics training and  maintaining scapular retract while performing lifting of cadavers; HEP; doing HEP in front of a mirror; advised pt she should not have any increased pain but may experience muscle soreness along periscapular muscles with proper muscle activation   [facility] Adult PT Treatment:                                                DATE: 11/06/21 Therapeutic Exercise: UBE level 1 x 5 min posterior with PT discussing how her pain has been this last week.  Prayer stretch 3x30 sec Bil Red band supine chest pull with bil scap retract x 20 Bil Red band hip pull with bil scap retract x 20 Supine bil ER attempted but pt unable to coordinate R/L scapular retract; therefore, R ER  performed only with R band anchored in L fist x 20 Supine Red band shoulder punch with concentration on eccentric lower/scap retract R Review HEP Prayer stretch 2x30 sec Standing unilat GTB shoulder ext with scap retract performed unilat bil x 15 each  Therapeutic Activity: Lifting items in [facility] room with concentration on good body mechanics beginning with 18 lbs from shelf at level 24 progressing to 28 lbs Iso hold with 18 lb box walk 2x28 ft with back getting tight after first bout and needing to do prayer stretch Sled push forward and backward 35 lbs x 2x 28 ft      PATIENT EDUCATION:  Education details: Civil engineer, contracting, HEP Person educated: Patient Education method: Explanation, Demonstration, Tactile cues, Verbal cues, and Handouts Education comprehension: verbalized understanding and returned demonstration     HOME EXERCISE PROGRAM: Access Code: [email or web address] URL: [email or web address] Date: 10/30/2021 Prepared by: Luna Fuse   Exercises - Seated Scapular Retraction  - 2 x daily - 7 x weekly - 1 sets - 10 reps unilat and bil - Corner Pec Major Stretch  - 2 x daily - 7 x weekly - 1 sets - 3 reps - 30 sec hold   ASSESSMENT:   CLINICAL IMPRESSION: Patient is a 26 y.o. female who was seen today for  treatment for posture, back, neck and shoulder pain related to large breasts. Pt reports being aware of her posture and attempting to perform scap retract esp on the right has assisted with her pain management since the evaluation but that it is still present. Pt continues to have difficulty with proper periscapular muscle recruitment R. Pt would benefit from PT for postural education and flexibility/strengthening exercises to assist with postural correction and correction of the above.      OBJECTIVE IMPAIRMENTS hypomobility, increased muscle spasms, impaired flexibility, improper body mechanics, postural dysfunction, and pain.    ACTIVITY LIMITATIONS carrying,  lifting, sitting, standing, and school activities such as carrying bookbag, wearing seatbelt without pain   PARTICIPATION LIMITATIONS: community activity and school   PERSONAL FACTORS 1 comorbidity: large breasts  are also affecting patient's functional outcome.    REHAB POTENTIAL: Good   CLINICAL DECISION MAKING: Stable/uncomplicated   EVALUATION COMPLEXITY: Low     GOALS: Goals reviewed with patient? Yes     LONG TERM GOALS: Target date:  12/11/21   Pt will be able to sit 1 hour with </=4/10 thoracic pain to eat dinner.  Baseline: 7/10 Goal status: ONGING   2.  Pt will be able to perform cardio with 25% less difficulty for 20 min.  Baseline:  70-80% difficulty Goal status: ONGOING   3.  Pt will be indep with HEP to assist with pain management and correction of postural dysfunction Baseline: issued at eval Goal status: ONGOING   4.  Pt will be able to carry bookbag with less L shoulder pain x 50% of the time Baseline: unable Goal status: ONGOING         PLAN: PT FREQUENCY: 1-2x/week   PT DURATION: 6 weeks   PLANNED INTERVENTIONS: Therapeutic exercises, Therapeutic activity, Patient/Family education, Joint mobilization, Dry Needling, Moist heat, Taping, Ultrasound, and Manual therapy.   PLAN FOR NEXT SESSION:  continue to work on Estate manager/land agentbody mechanics and recruitment of R periscapular muscles properly       Luna FuseNikia Moore, PT 11/06/2021, 12:49 PM       Luna FuseNikia Moore, PT 11/06/2021, 12:49 PM  [facility] [address] [place], [place], [id number] Phone: [phone]   Fax:  [phone]

## 2021-11-11 ENCOUNTER — Encounter: Payer: Self-pay | Admitting: Physical Therapy

## 2021-11-11 ENCOUNTER — Ambulatory Visit: Payer: BC Managed Care – PPO | Admitting: Physical Therapy

## 2021-11-11 DIAGNOSIS — R293 Abnormal posture: Secondary | ICD-10-CM

## 2021-11-11 NOTE — Therapy (Signed)
[facility] Outpatient Rehabilitation [facility] [address] [place], [place], [address] Phone: [phone]   Fax:  [phone]  Patient Details  Name: Jamie Lopez MRN: [id number] Date of Birth: 02-Apr-1996 Referring Provider:  Janeece Agee, NP  Encounter Date: 11/11/2021   OUTPATIENT PHYSICAL THERAPY TREATMENT NOTE   Patient Name: Jamie Lopez MRN: [id number] DOB:18-Jun-1995, 26 y.o., female Today's Date: 11/11/2021  PCP: Janeece Agee, NP REFERRING PROVIDER: Merry Proud, MD    PT End of Session - 11/11/21 1752     Visit Number 3    Number of Visits 10    Date for PT Re-Evaluation 12/11/21    PT Start Time 1751    PT Stop Time 1830    PT Time Calculation (min) 39 min    Activity Tolerance Patient tolerated treatment well;No increased pain    Behavior During Therapy WFL for tasks assessed/performed             Past Medical History:  Diagnosis Date   Anxiety    Asthma    Headache(784.0)    Lupus (HCC)    Rosacea    Seasonal allergies    Past Surgical History:  Procedure Laterality Date   NO PAST SURGERIES     WISDOM TOOTH EXTRACTION     Patient Active Problem List   Diagnosis Date Noted   Lupus (HCC) 07/16/2020   GAD (generalized anxiety disorder) 10/25/2012   Migraines 10/25/2012   Migraine 05/28/2011     REFERRING DIAG:  N62 (ICD-10-CM) - Macromastia  M54.50,M54.6 (ICD-10-CM) - Back pain of thoracolumbar region  M40.04 (ICD-10-CM) - Postural kyphosis, thoracic region    THERAPY DIAG:  Abnormal posture  Rationale for Evaluation and Treatment Rehabilitation  PERTINENT HISTORY: Per chart review, pt has had years of back, neck and shoulder pain related to large breasts. She is a G cup. She does have lupus. 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   PRECAUTIONS: none  SUBJECTIVE:   Pt reports that she has been a little less compliant with her HEP d/t being busy with PA  school.  PAIN:  Are you having pain? No   OBJECTIVE:    DIAGNOSTIC FINDINGS:  N/a   PATIENT SURVEYS:  Modified Oswestry score 16    SCREENING FOR RED FLAGS: Bowel or bladder incontinence: No Spinal tumors: No Cauda equina syndrome: No Compression fracture: No Abdominal aneurysm: No   COGNITION:            Overall cognitive status: Within functional limits for tasks assessed                          SENSATION: WFL   POSTURE: rounded shoulders   PALPATION: R thoracic paraspinals tighter than left, T2 and T7 more hypomobile, difficulty with R periscapular muscle activation   SENSATION: Left UE radicular sxs reported which increases with exertion such as carrying bookbag or wearing seatbelt. Starts supraclavicular to elbow region frontal; L pec tighter   CERVICAL ROM:    Active  A/PROM  eval  Flexion WFL  Extension WFL  Right lateral flexion WFL  Left lateral flexion WFL  Right rotation WFL  Left rotation WFL   (Blank rows = not tested)   SHOULDER AROM WFL   SHOULDER STRENGTH: 5/5 THROUGHOUT   CERVICAL/UE SPECIAL TESTS: Spurlings/compression/distraction all -; Median nerve test L questionable due to pt reports of just feeling "different" but without burning/tingling reported.  TODAY'S TREATMENT  10/30/21: Body mechanics training and maintaining scapular retract while performing lifting of cadavers; HEP; doing HEP in front of a mirror; advised pt she should not have any increased pain but may experience muscle soreness along periscapular muscles with proper muscle activation   [facility] Adult PT Treatment:                                                DATE: 11/11/21 Therapeutic Exercise: UBE level 2 x 5 min posterior with PT discussing how her pain has been this last week.  Prayer stretch 3x30 sec Open book - 15x ea Low row - 2x10 - 30# High row - 2x10 - 30# Lat pull down - 2x10 OH press - KB bottom up 5# - 2x10 ea Foam roller routine for thoracic mobility -  including protraction/retraction (unilateral and bilateral), cc/cw circles, horizontal abduction, shoulder flexion/ext alternating, shoulder abduction, thread the needle Sled push forward and backward 35 lbs x 2x 28 ft (NT)   [facility] Adult PT Treatment:                                                DATE: 11/06/21 Therapeutic Exercise: UBE level 1 x 5 min posterior with PT discussing how her pain has been this last week.  Prayer stretch 3x30 sec Bil Red band supine chest pull with bil scap retract x 20 Bil Red band hip pull with bil scap retract x 20 Supine bil ER attempted but pt unable to coordinate R/L scapular retract; therefore, R ER performed only with R band anchored in L fist x 20 Supine Red band shoulder punch with concentration on eccentric lower/scap retract R Review HEP Prayer stretch 2x30 sec Standing unilat GTB shoulder ext with scap retract performed unilat bil x 15 each  Therapeutic Activity: Lifting items in FCE room with concentration on good body mechanics beginning with 18 lbs from shelf at level 24 progressing to 28 lbs Iso hold with 18 lb box walk 2x28 ft with back getting tight after first bout and needing to do prayer stretch Sled push forward and backward 35 lbs x 2x 28 ft      PATIENT EDUCATION:  Education details: Civil engineer, contracting, HEP Person educated: Patient Education method: Explanation, Demonstration, Tactile cues, Verbal cues, and Handouts Education comprehension: verbalized understanding and returned demonstration     HOME EXERCISE PROGRAM: Access Code: [email or web address] URL: [email or web address] Date: 10/30/2021 Prepared by: Jamie Lopez   Exercises - Seated Scapular Retraction  - 2 x daily - 7 x weekly - 1 sets - 10 reps unilat and bil - Corner Pec Major Stretch  - 2 x daily - 7 x weekly - 1 sets - 3 reps - 30 sec hold   ASSESSMENT:   CLINICAL IMPRESSION: Jamie Lopez did very well with therapy today with no increase in pain.  We  concentrated on thoracic mobility followed by periscapular strengthening.  She does show strength deficit L UE > R.  Will continue strength progression as able.   OBJECTIVE IMPAIRMENTS hypomobility, increased muscle spasms, impaired flexibility, improper body mechanics, postural dysfunction, and pain.    ACTIVITY LIMITATIONS carrying, lifting, sitting, standing, and school activities such as carrying bookbag, wearing seatbelt without pain  PARTICIPATION LIMITATIONS: community activity and school   PERSONAL FACTORS 1 comorbidity: large breasts  are also affecting patient's functional outcome.    REHAB POTENTIAL: Good   CLINICAL DECISION MAKING: Stable/uncomplicated   EVALUATION COMPLEXITY: Low     GOALS: Goals reviewed with patient? Yes     LONG TERM GOALS: Target date:  12/11/21   Pt will be able to sit 1 hour with </=4/10 thoracic pain to eat dinner.  Baseline: 7/10 Goal status: ONGING   2.  Pt will be able to perform cardio with 25% less difficulty for 20 min.  Baseline: 70-80% difficulty Goal status: ONGOING   3.  Pt will be indep with HEP to assist with pain management and correction of postural dysfunction Baseline: issued at eval Goal status: ONGOING   4.  Pt will be able to carry bookbag with less L shoulder pain x 50% of the time Baseline: unable Goal status: ONGOING         PLAN: PT FREQUENCY: 1-2x/week   PT DURATION: 6 weeks   PLANNED INTERVENTIONS: Therapeutic exercises, Therapeutic activity, Patient/Family education, Joint mobilization, Dry Needling, Moist heat, Taping, Ultrasound, and Manual therapy.   PLAN FOR NEXT SESSION:  continue to work on Estate manager/land agent and recruitment of R periscapular muscles properly       Jamie Lopez, PT 11/11/2021, 6:32 PM   [facility] Out[facility] [address] [place], [place], [address] Phone: [phone]   Fax:  [phone]

## 2021-11-18 ENCOUNTER — Telehealth (HOSPITAL_COMMUNITY): Payer: Self-pay | Admitting: Psychiatry

## 2021-11-18 ENCOUNTER — Telehealth (HOSPITAL_COMMUNITY): Payer: BC Managed Care – PPO | Admitting: Psychiatry

## 2021-11-18 NOTE — Telephone Encounter (Signed)
Patient was not present on video platform used through mychart. I called the patient at our scheduled appointment time. There was no answer. I left a voice message for patient to call the clinic back at their convinence. There was no return phone call during out scheduled visit time. I was not able to speak with the patient today, as they were a no show for their scheduled appointment.   

## 2021-11-20 ENCOUNTER — Ambulatory Visit: Payer: BC Managed Care – PPO

## 2021-11-20 ENCOUNTER — Telehealth: Payer: Self-pay

## 2021-11-25 ENCOUNTER — Telehealth (HOSPITAL_BASED_OUTPATIENT_CLINIC_OR_DEPARTMENT_OTHER): Payer: BC Managed Care – PPO | Admitting: Psychiatry

## 2021-11-25 ENCOUNTER — Encounter (HOSPITAL_COMMUNITY): Payer: Self-pay | Admitting: Psychiatry

## 2021-11-25 DIAGNOSIS — F411 Generalized anxiety disorder: Secondary | ICD-10-CM | POA: Diagnosis not present

## 2021-11-25 DIAGNOSIS — F331 Major depressive disorder, recurrent, moderate: Secondary | ICD-10-CM

## 2021-11-25 DIAGNOSIS — F5105 Insomnia due to other mental disorder: Secondary | ICD-10-CM

## 2021-11-25 DIAGNOSIS — F99 Mental disorder, not otherwise specified: Secondary | ICD-10-CM | POA: Diagnosis not present

## 2021-11-25 MED ORDER — SERTRALINE HCL 100 MG PO TABS: 200.0000 mg | ORAL_TABLET | Freq: Every day | ORAL | 0 refills | Status: DC

## 2021-11-25 MED ORDER — PROPRANOLOL HCL 10 MG PO TABS: 10.0000 mg | ORAL_TABLET | Freq: Two times a day (BID) | ORAL | 0 refills | Status: DC

## 2021-11-25 MED ORDER — BUSPIRONE HCL 15 MG PO TABS: 7.5000 mg | ORAL_TABLET | Freq: Two times a day (BID) | ORAL | 0 refills | Status: DC

## 2021-11-25 NOTE — Progress Notes (Signed)
Virtual Visit via Video Note  I connected with Jamie Lopez on 11/25/21 at  4:00 PM EDT by a video enabled telemedicine application and verified that I am speaking with the correct person using two identifiers.  Location: Patient: sitting in parked car Provider: office   I discussed the limitations of evaluation and management by telemedicine and the availability of in person appointments. The patient expressed understanding and agreed to proceed.  History of Present Illness: Jamie Lopez shares a lot has been happening lately. She has completed 4 weeks of PA school. It was a rough start due to feeling like an impostor and inadequate. She was having severe anxiety about practical and tests. Her classes this semester are really hard. This is normal for her. She is trying to exercised daily and work thru her anxiety. Jamie Lopez reflected on her feelings about her dad's passing on father's day. She spent time with family. It was a good day. Her anxiety today is 3/10 because she took 2 exams earlier today. Most days her anxiety level is 5/10 (10 being the worst). Her depression varies on the type of day she is having. Last week she was more depressed and was isolating. She is depressed 2-3 days/week. She has started to give herself a time limit to be sad and it helps sometimes. Jamie Lopez has been talking to her social supports and that really helps. Most of depression is centered on her self worth. If she doesn't then she will isolate and cry. Jamie Lopez denies SI/HI. Jamie Lopez does not take the Vistaril on week days because it makes her sleepy. She takes Melatonin 10mg  a couple of times a week on average. Most nights she sleeps 6-7 hours/night. Propranolol seems to help her anxiety but she is not sure because she has been taking it for so long.    Observations/Objective: Psychiatric Specialty Exam: ROS  There were no vitals taken for this visit.There is no height or weight on file to calculate BMI.  General Appearance: Casual  and Neat  Eye Contact:  Good  Speech:  Clear and Coherent and Normal Rate  Volume:  Normal  Mood:  Anxious  Affect:  Full Range  Thought Process:  Goal Directed, Linear, and Descriptions of Associations: Intact  Orientation:  Full (Time, Place, and Person)  Thought Content:  Logical  Suicidal Thoughts:  No  Homicidal Thoughts:  No  Memory:  Immediate;   Good  Judgement:  Good  Insight:  Good  Psychomotor Activity:  Normal  Concentration:  Concentration: Good  Recall:  Good  Fund of Knowledge:  Good  Language:  Good  Akathisia:  No  Handed:  Right  AIMS (if indicated):     Assets:  Communication Skills Desire for Improvement Financial Resources/Insurance Housing Resilience Social Support Talents/Skills Transportation Vocational/Educational  ADL's:  Intact  Cognition:  WNL  Sleep:        Assessment and Plan:     11/25/2021    4:09 PM 08/26/2021    3:46 PM 07/13/2021   11:01 AM 05/27/2021    2:36 PM 04/06/2021    3:01 PM  Depression screen PHQ 2/9  Decreased Interest 0 0 1 0 1  Down, Depressed, Hopeless 1 0 1 1 1   PHQ - 2 Score 1 0 2 1 2   Altered sleeping   2  2  Tired, decreased energy   2  2  Change in appetite   2  2  Feeling bad or failure about yourself    0  1  Trouble concentrating   2  2  Moving slowly or fidgety/restless   2  1  Suicidal thoughts   0  0  PHQ-9 Score   12  12  Difficult doing work/chores   Not difficult at all  Somewhat difficult    Flowsheet Row Video Visit from [facility]/25/2021 in [facility] Video Visit from 08/26/2021 in [facility] Video Visit from 05/27/2021 in [facility]  C-SSRS RISK CATEGORY No Risk No Risk No Risk        The risk of un-intended pregnancy is low based on the fact that pt reports using and IUD. Pt is aware that these meds carry a teratogenic risk. Pt will discuss plan of action if she does or  plans to become pregnant in the future.  Status of current problems: ongoing anxiety, depression is stable  Meds:  continue Vistaril- no refill today as she has enough 1. GAD (generalized anxiety disorder) - busPIRone (BUSPAR) 15 MG tablet; Take 0.5 tablets (7.5 mg total) by mouth 2 (two) times daily.  Dispense: 90 tablet; Refill: 0 - propranolol (INDERAL) 10 MG tablet; Take 1 tablet (10 mg total) by mouth 2 (two) times daily.  Dispense: 180 tablet; Refill: 0 - sertraline (ZOLOFT) 100 MG tablet; Take 2 tablets (200 mg total) by mouth daily.  Dispense: 180 tablet; Refill: 0  2. MDD (major depressive disorder), recurrent episode, moderate (HCC) - sertraline (ZOLOFT) 100 MG tablet; Take 2 tablets (200 mg total) by mouth daily.  Dispense: 180 tablet; Refill: 0  3. Insomnia due to other mental disorder     Labs: none    Therapy: brief supportive therapy provided. Discussed psychosocial stressors in detail.   Collaboration of Care: Other none  Patient/Guardian was advised Release of Information must be obtained prior to any record release in order to collaborate their care with an outside provider. Patient/Guardian was advised if they have not already done so to contact the registration department to sign all necessary forms in order for [place] to release information regarding their care.   Consent: Patient/Guardian gives verbal consent for treatment and assignment of benefits for services provided during this visit. Patient/Guardian expressed understanding and agreed to proceed.    Follow Up Instructions: Follow up in 2-3 months or sooner if needed    I discussed the assessment and treatment plan with the patient. The patient was provided an opportunity to ask questions and all were answered. The patient agreed with the plan and demonstrated an understanding of the instructions.   The patient was advised to call back or seek an in-person evaluation if the symptoms worsen or if the  condition fails to improve as anticipated.  I provided 16 minutes of non-face-to-face time during this encounter.   Oletta Darter, MD

## 2021-11-27 ENCOUNTER — Encounter: Payer: Self-pay | Admitting: Rehabilitative and Restorative Service Providers"

## 2021-11-27 ENCOUNTER — Ambulatory Visit: Payer: BC Managed Care – PPO | Attending: Plastic Surgery | Admitting: Rehabilitative and Restorative Service Providers"

## 2021-11-27 DIAGNOSIS — R293 Abnormal posture: Secondary | ICD-10-CM | POA: Insufficient documentation

## 2021-11-27 NOTE — Therapy (Addendum)
[place], [place], [address] Phone: [phone]   Fax:  [phone]  Patient Details  Name: Jamie Lopez MRN: [id number] Date of Birth: 08-25-1995 Referring Provider:  Maximiano Coss, NP  Encounter Date: 11/27/2021  [facility] [address] [place], [place], [address] Phone: [phone]   Fax:  [phone]  Patient Details  Name: Jamie Lopez MRN: [id number] Date of Birth: 1996-03-22 Referring Provider:  Maximiano Coss, NP  Encounter Date: 11/27/2021   OUTPATIENT PHYSICAL THERAPY TREATMENT NOTE/Discharge Note   Patient Name: Jamie Lopez MRN: [id number] DOB:Oct 12, 1995, 26 y.o., female Today's Date: 11/27/2021  PCP: Maximiano Coss, NP REFERRING PROVIDER: Mingo Amber, MD    PT End of Session - 11/27/21 0955     Visit Number 4    Number of Visits 10    Date for PT Re-Evaluation 12/11/21    PT Start Time 0955    PT Stop Time 1034    PT Time Calculation (min) 39 min    Activity Tolerance Patient tolerated treatment well;No increased pain    Behavior During Therapy WFL for tasks assessed/performed              Past Medical History:  Diagnosis Date   Anxiety    Asthma    Headache(784.0)    Lupus ([place])    Rosacea    Seasonal allergies    Past Surgical History:  Procedure Laterality Date   NO PAST SURGERIES     WISDOM TOOTH EXTRACTION     Patient Active Problem List   Diagnosis Date Noted   Lupus ([place]) 07/16/2020   GAD (generalized anxiety disorder) 10/25/2012   Migraines 10/25/2012   Migraine 05/28/2011     REFERRING DIAG:  N62 (ICD-10-CM) - Macromastia  M54.50,M54.6 (ICD-10-CM) - Back pain of thoracolumbar region  M40.04 (ICD-10-CM) - Postural kyphosis, thoracic region    THERAPY DIAG:  Abnormal posture  Rationale for Evaluation and Treatment Rehabilitation  PERTINENT HISTORY: Per chart review, pt has had  years of back, neck and shoulder pain related to large breasts. She is a G cup. She does have lupus. 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   PRECAUTIONS: none  SUBJECTIVE:   Pt reports that she has been a little less compliant with her HEP d/t being busy with PA school. I haven't had any pain moving the cadavers. Pt 10 min late today.  PAIN:  Are you having pain? No   OBJECTIVE:    DIAGNOSTIC FINDINGS:  N/a   PATIENT SURVEYS:  Modified Oswestry score 16    SCREENING FOR RED FLAGS: Bowel or bladder incontinence: No Spinal tumors: No Cauda equina syndrome: No Compression fracture: No Abdominal aneurysm: No   COGNITION:            Overall cognitive status: Within functional limits for tasks assessed                          SENSATION: WFL   POSTURE: rounded shoulders   PALPATION: R thoracic paraspinals tighter than left, T2 and T7 more hypomobile, difficulty with R periscapular muscle activation   SENSATION: Left UE radicular sxs reported which increases with exertion such as carrying bookbag or wearing seatbelt. Starts supraclavicular to elbow region frontal; L pec tighter   CERVICAL ROM:    Active  A/PROM  eval  Flexion WFL  Extension WFL  Right lateral flexion [facility]  Left lateral flexion WFL  Right rotation WFL  Left rotation WFL   (Blank rows = not tested)   SHOULDER AROM WFL   SHOULDER STRENGTH: 5/5 THROUGHOUT   CERVICAL/UE SPECIAL TESTS: Spurlings/compression/distraction all -; Median nerve test L questionable due to pt reports of just feeling "different" but without burning/tingling reported.    TODAY'S TREATMENT  10/30/21: Body mechanics training and maintaining scapular retract while performing lifting of cadavers; HEP; doing HEP in front of a mirror; advised pt she should not have any increased pain but may experience muscle soreness along periscapular muscles with proper muscle activation  [facility] Adult PT  Treatment:                                                DATE: 11/27/21 Therapeutic Exercise: UBE level 3 x 5 min posterior with PT verbal cues for posture Freemotion shoulder ext 7 lbs x 15 performed unilat bil Freemotion row 10 lbs x 15 performed unilat bil Freemotion ER unilat bil x 12 each side Planks from elbows 5x15 sec Green band chest pull x 20 Green band hip pull x 20 Green band bil ER x 20 Green band horiz pull x 20 Prone horiz scap retract x 20 Prone "Y" exercise with scap retract x 20 Prone "T" exercise with scap retract x 20 PT discussed importance of exercise compliance    [facility] Adult PT Treatment:                                                DATE: 11/11/21 Therapeutic Exercise: UBE level 2 x 5 min posterior with PT discussing how her pain has been this last week.  Prayer stretch 3x30 sec Open book - 15x ea Low row - 2x10 - 30# High row - 2x10 - 30# Lat pull down - 2x10 OH press - KB bottom up 5# - 2x10 ea Foam roller routine for thoracic mobility - including protraction/retraction (unilateral and bilateral), cc/cw circles, horizontal abduction, shoulder flexion/ext alternating, shoulder abduction, thread the needle Sled push forward and backward 35 lbs x 2x 28 ft (NT)   [facility] Adult PT Treatment:                                                DATE: 11/06/21 Therapeutic Exercise: UBE level 1 x 5 min posterior with PT discussing how her pain has been this last week.  Prayer stretch 3x30 sec Bil Red band supine chest pull with bil scap retract x 20 Bil Red band hip pull with bil scap retract x 20 Supine bil ER attempted but pt unable to coordinate R/L scapular retract; therefore, R ER performed only with R band anchored in L fist x 20 Supine Red band shoulder punch with concentration on eccentric lower/scap retract R Review HEP Prayer stretch 2x30 sec Standing unilat GTB shoulder ext with scap retract performed unilat bil x 15 each  Therapeutic Activity: Lifting  items in FCE room with concentration on good body mechanics beginning with 18 lbs from shelf at level 24 progressing to 28 lbs Iso hold with 18 lb  box walk 2x28 ft with back getting tight after first bout and needing to do prayer stretch Sled push forward and backward 35 lbs x 2x 28 ft      PATIENT EDUCATION:  Education details: Dietitian, HEP Person educated: Patient Education method: Explanation, Demonstration, Tactile cues, Verbal cues, and Handouts Education comprehension: verbalized understanding and returned demonstration     HOME EXERCISE PROGRAM: Access Code: [email or web address] URL: [email or web address] Date: 10/30/2021 Prepared by: America Brown   Exercises - Seated Scapular Retraction  - 2 x daily - 7 x weekly - 1 sets - 10 reps unilat and bil - Corner Pec Major Stretch  - 2 x daily - 7 x weekly - 1 sets - 3 reps - 30 sec hold   ASSESSMENT:   CLINICAL IMPRESSION: [name] did very well with therapy today with no increase in pain.  We concentrated on thoracic mobility followed by periscapular strengthening.  She does show strength deficit L UE > R.  Will continue strength progression as able.   OBJECTIVE IMPAIRMENTS hypomobility, increased muscle spasms, impaired flexibility, improper body mechanics, postural dysfunction, and pain.    ACTIVITY LIMITATIONS carrying, lifting, sitting, standing, and school activities such as carrying bookbag, wearing seatbelt without pain   PARTICIPATION LIMITATIONS: community activity and school   PERSONAL FACTORS 1 comorbidity: large breasts  are also affecting patient's functional outcome.    REHAB POTENTIAL: Good   CLINICAL DECISION MAKING: Stable/uncomplicated   EVALUATION COMPLEXITY: Low     GOALS: Goals reviewed with patient? Yes     LONG TERM GOALS: Target date:  12/11/21   Pt will be able to sit 1 hour with </=4/10 thoracic pain to eat dinner.  Baseline: 7/10 Goal status: ONGING   2.  Pt will be able to  perform cardio with 25% less difficulty for 20 min.  Baseline: 70-80% difficulty Goal status: ONGOING   3.  Pt will be indep with HEP to assist with pain management and correction of postural dysfunction Baseline: issued at eval Goal status: ONGOING   4.  Pt will be able to carry bookbag with less L shoulder pain x 50% of the time Baseline: unable Goal status: ONGOING         PLAN: PT FREQUENCY: 1-2x/week   PT DURATION: 6 weeks   PLANNED INTERVENTIONS: Therapeutic exercises, Therapeutic activity, Patient/Family education, Joint mobilization, Dry Needling, Moist heat, Taping, Ultrasound, and Manual therapy.   PLAN FOR NEXT SESSION:  continue to work on Economist and recruitment of R periscapular muscles properly       America Brown, PT 11/27/2021, 10:31 AM   [place], [place], [address] Phone: [phone]   Fax:  [phone]   America Brown, PT 11/27/2021, 10:31 AM  [facility] [address] [place], [place], [address] Phone: [phone]   Fax:  [phone]  PHYSICAL THERAPY DISCHARGE SUMMARY  Visits from Start of Care: 4  Current functional level related to goals / functional outcomes: unknown   Remaining deficits: unknown   Education / Equipment: HEP, postural therex   Patient agrees to discharge. Patient goals were partially met. Patient is being discharged due to not returning since the last visit.

## 2021-12-11 ENCOUNTER — Ambulatory Visit: Payer: BC Managed Care – PPO | Admitting: Rehabilitative and Restorative Service Providers"

## 2021-12-11 ENCOUNTER — Telehealth: Payer: Self-pay | Admitting: Rehabilitative and Restorative Service Providers"

## 2021-12-11 NOTE — Therapy (Deleted)
Pt no showed for appt; PT phoned and spoke with patient who stated she forgot about appt due to studying for an exam on Monday. PT told her to make sure she comes for her next PT appt to determine DC vs continuation of therapy services.She agreed.

## 2021-12-11 NOTE — Telephone Encounter (Signed)
Pt no showed for appt; PT phoned and spoke with patient who stated she forgot about appt due to studying for an exam on Monday. PT told her to make sure she comes for her next PT appt to determine DC vs continuation of therapy services.She agreed.  

## 2021-12-18 ENCOUNTER — Ambulatory Visit: Payer: BC Managed Care – PPO | Admitting: Physical Therapy

## 2022-01-26 ENCOUNTER — Ambulatory Visit
Admission: RE | Admit: 2022-01-26 | Discharge: 2022-01-26 | Disposition: A | Discharge status: Home / Self Care | Payer: BC Managed Care – PPO | Source: Ambulatory Visit | Attending: Emergency Medicine | Admitting: Emergency Medicine

## 2022-01-26 VITALS — BP 111/77 | HR 92 | Temp 98.8°F | Resp 18

## 2022-01-26 DIAGNOSIS — K2100 Gastro-esophageal reflux disease with esophagitis, without bleeding: Secondary | ICD-10-CM | POA: Diagnosis not present

## 2022-01-26 MED ORDER — LANSOPRAZOLE 30 MG PO CPDR: 30.0000 mg | DELAYED_RELEASE_CAPSULE | Freq: Two times a day (BID) | ORAL | 0 refills | Status: DC

## 2022-01-26 NOTE — Discharge Instructions (Signed)
I provided you with a prescription for Prevacid to take twice daily on a regular basis to suppress acid production.  You are welcome to continue taking famotidine 20 mg once or twice daily as needed for breakthrough symptoms.  I anticipate that the full benefit of these medications will occur in 3 to 5 days but some people experience relief sooner.  Please discuss this medication regimen with your new provider.  Thank you for visiting urgent care today.

## 2022-01-26 NOTE — ED Triage Notes (Signed)
Pt c/o acid reflux, chest discomfort, nausea.  Started: last night   Home interventions: TUMS, pepcid, zofran

## 2022-01-26 NOTE — ED Provider Notes (Addendum)
[facility]    CSN: [id number] Arrival date & time: 01/26/22  1447    HISTORY   Chief Complaint  Patient presents with   Abdominal Pain    GERD symptoms. - Entered by patient   Heartburn   HPI Jamie Lopez is a pleasant, 26 y.o. female who presents to urgent care today. Pt c/o acid reflux, chest discomfort, nausea.  Patient states she had a similar episode about a year ago for which she was prescribed pantoprazole and Zofran.  Patient states this recurrence started last night.  Patient states she has been taking Tums, omeprazole, Pepcid and Zofran without relief of her symptoms.  Patient denies hematemesis, fever, body aches, headache, dizziness, shortness of breath, excessive alcohol use, use of illicit drugs.  The history is provided by the patient.   Past Medical History:  Diagnosis Date   Anxiety    Asthma    Headache(784.0)    Lupus (HCC)    Rosacea    Seasonal allergies    Patient Active Problem List   Diagnosis Date Noted   Lupus (HCC) 07/16/2020   GAD (generalized anxiety disorder) 10/25/2012   Migraines 10/25/2012   Migraine 05/28/2011   Past Surgical History:  Procedure Laterality Date   NO PAST SURGERIES     WISDOM TOOTH EXTRACTION     OB History   No obstetric history on file.    Home Medications    Prior to Admission medications   Medication Sig Start Date End Date Taking? Authorizing Provider  budesonide (RHINOCORT ALLERGY) 32 MCG/ACT nasal spray Place 1-2 sprays into both nostrils daily. 08/25/18   Shade Flood, MD  busPIRone (BUSPAR) 15 MG tablet Take 0.5 tablets (7.5 mg total) by mouth 2 (two) times daily. 11/25/21   Oletta Darter, MD  cetirizine (ZYRTEC) 10 MG tablet Take 1 tablet (10 mg total) by mouth daily. 08/25/18   Shade Flood, MD  diclofenac (VOLTAREN) 75 MG EC tablet Take 75 mg by mouth 2 (two) times daily.    [provider]  etonogestrel-ethinyl estradiol (NUVARING) 0.12-0.015 MG/24HR vaginal ring  Place 1 each vaginally every 28 (twenty-eight) days. Insert vaginally and leave in place for 3 consecutive weeks, then remove for 1 week.    [provider]  hydroxychloroquine (PLAQUENIL) 200 MG tablet Take 200 mg by mouth 2 (two) times daily. 06/30/20   [provider]  hydrOXYzine (VISTARIL) 25 MG capsule TAKE 1 TO 2 CAPSULES BY MOUTH EVERY DAY AT BEDTIME AS NEEDED FOR INSOMNIA 08/26/21   Oletta Darter, MD  meloxicam (MOBIC) 15 MG tablet Take 15 mg by mouth daily as needed for pain.    [provider]  naproxen (NAPROSYN) 500 MG tablet Take 1 tablet (500 mg total) by mouth 2 (two) times daily. 06/01/18   Kendrick, Caitlyn S, PA-C  Olopatadine HCl (PATADAY) 0.2 % SOLN Apply 1 drop to eye daily as needed. 08/25/18   Shade Flood, MD  ondansetron (ZOFRAN-ODT) 4 MG disintegrating tablet Take 1 tablet (4 mg total) by mouth as needed. 04/06/21   Janeece Agee, NP  propranolol (INDERAL) 10 MG tablet Take 1 tablet (10 mg total) by mouth 2 (two) times daily. 11/25/21   Oletta Darter, MD  rizatriptan (MAXALT-MLT) 10 MG disintegrating tablet Take 1 tablet (10 mg total) by mouth as needed for migraine. May repeat in 2 hours if needed 01/29/21   Janeece Agee, NP  sertraline (ZOLOFT) 100 MG tablet Take 2 tablets (200 mg total) by mouth daily.  11/25/21   Oletta Darter, MD  sucralfate (CARAFATE) 1 g tablet Take 1 tablet (1 g total) by mouth 4 (four) times daily -  with meals and at bedtime. 02/18/21   Janeece Agee, NP  triamcinolone cream (KENALOG) 0.1 % Apply 1 application topically 2 (two) times daily. 01/02/19   Janeece Agee, NP  VENTOLIN HFA 108 (90 Base) MCG/ACT inhaler INHALE 1-2 PUFFS BY MOUTH EVERY 6 HOURS AS NEEDED FOR WHEEZE OR SHORTNESS OF BREATH 08/16/21   Janeece Agee, NP    Family History Family History  Problem Relation Age of Onset   Anxiety disorder Mother    Diabetes Mellitus II Mother    Depression Mother    Anxiety disorder Maternal Grandfather     Depression Maternal Grandfather    Alcohol abuse Maternal Grandfather    Drug abuse Maternal Grandfather    Anxiety disorder Maternal Grandmother    Diabetes Mellitus II Maternal Grandmother    Depression Maternal Grandmother    Alcohol abuse Paternal Grandfather    Drug abuse Paternal Grandfather    Bipolar disorder Cousin    Schizophrenia Cousin    Drug abuse Father    Social History Social History   Tobacco Use   Smoking status: Light Smoker    Types: Cigarettes, Cigars   Smokeless tobacco: Never   Tobacco comments:    once a year or less when very stressed.  Vaping Use   Vaping Use: Never used  Substance Use Topics   Alcohol use: Yes    Alcohol/week: 2.0 standard drinks of alcohol    Types: 2 Glasses of wine per week    Comment: social: 1-2x/week   Drug use: Not Currently    Types: Marijuana    Comment: quit in 2018   Allergies   Fruit & vegetable daily [nutritional supplements], Peanut-containing drug products, and Other  Review of Systems Review of Systems Pertinent findings revealed after performing a 14 point review of systems has been noted in the history of present illness.  Physical Exam Triage Vital Signs ED Triage Vitals  Enc Vitals Group     BP 03/26/21 0827 (!) 147/82     Pulse Rate 03/26/21 0827 72     Resp 03/26/21 0827 18     Temp 03/26/21 0827 98.3 F (36.8 C)     Temp Source 03/26/21 0827 Oral     SpO2 03/26/21 0827 98 %     Weight --      Height --      Head Circumference --      Peak Flow --      Pain Score 03/26/21 0826 5     Pain Loc --      Pain Edu? --      Excl. in GC? --   No data found.  Updated Vital Signs BP 111/77 (BP Location: Right Arm)   Pulse 92   Temp 98.8 F (37.1 C) (Oral)   Resp 18   SpO2 98%   Physical Exam Vitals and nursing note reviewed.  Constitutional:      General: She is not in acute distress.    Appearance: Normal appearance. She is not ill-appearing.  HENT:     Head: Normocephalic and  atraumatic.  Eyes:     General: Lids are normal.        Right eye: No discharge.        Left eye: No discharge.     Extraocular Movements: Extraocular movements intact.     Conjunctiva/sclera:  Conjunctivae normal.     Right eye: Right conjunctiva is not injected.     Left eye: Left conjunctiva is not injected.  Neck:     Trachea: Trachea and phonation normal.  Cardiovascular:     Rate and Rhythm: Normal rate and regular rhythm.     Pulses: Normal pulses.     Heart sounds: Normal heart sounds. No murmur heard.    No friction rub. No gallop.  Pulmonary:     Effort: Pulmonary effort is normal. No accessory muscle usage, prolonged expiration or respiratory distress.     Breath sounds: Normal breath sounds. No stridor, decreased air movement or transmitted upper airway sounds. No decreased breath sounds, wheezing, rhonchi or rales.  Chest:     Chest wall: No tenderness.  Abdominal:     General: Abdomen is flat. Bowel sounds are normal. There is no distension.     Palpations: Abdomen is soft.     Tenderness: There is abdominal tenderness in the epigastric area. There is no right CVA tenderness or left CVA tenderness.     Hernia: No hernia is present.  Musculoskeletal:        General: Normal range of motion.     Cervical back: Normal range of motion and neck supple. Normal range of motion.  Lymphadenopathy:     Cervical: No cervical adenopathy.  Skin:    General: Skin is warm and dry.     Findings: No erythema or rash.  Neurological:     General: No focal deficit present.     Mental Status: She is alert and oriented to person, place, and time.  Psychiatric:        Mood and Affect: Mood normal.        Behavior: Behavior normal.     Visual Acuity Right Eye Distance:   Left Eye Distance:   Bilateral Distance:    Right Eye Near:   Left Eye Near:    Bilateral Near:     UC Couse / Diagnostics / Procedures:     Radiology No results found.  Procedures Procedures (including  critical care time) EKG  Pending results:  Labs Reviewed - No data to display  Medications Ordered in UC: Medications - No data to display  UC Diagnoses / Final Clinical Impressions(s)   I have reviewed the triage vital signs and the nursing notes.  Pertinent labs & imaging results that were available during my care of the patient were reviewed by me and considered in my medical decision making (see chart for details).    Final diagnoses:  Gastroesophageal reflux disease with esophagitis without hemorrhage   Patient provided with a prescription for lansoprazole twice daily to take until she meets with her new PCP in 2 days.  Patient also advised she can continue to famotidine if she chooses for breakthrough symptoms.  Patient advised to go the emergency room if she begins to vomit blood or have unexplained diarrhea.  ED Prescriptions     Medication Sig Dispense Auth. Provider   lansoprazole (PREVACID) 30 MG capsule Take 1 capsule (30 mg total) by mouth 2 (two) times daily before a meal. 30 minutes prior to breakfast meal 60 capsule Theadora Rama Scales, PA-C      PDMP not reviewed this encounter.  Pending results:  Labs Reviewed - No data to display  Discharge Instructions:   Discharge Instructions      I provided you with a prescription for Prevacid to take twice daily on a regular basis  to suppress acid production.  You are welcome to continue taking famotidine 20 mg once or twice daily as needed for breakthrough symptoms.  I anticipate that the full benefit of these medications will occur in 3 to 5 days but some people experience relief sooner.  Please discuss this medication regimen with your new provider.  Thank you for visiting urgent care today.      Disposition Upon Discharge:  Condition: stable for discharge home  Patient presented with an acute illness with associated systemic symptoms and significant discomfort requiring urgent management. In my opinion,  this is a condition that a prudent lay person (someone who possesses an average knowledge of health and medicine) may potentially expect to result in complications if not addressed urgently such as respiratory distress, impairment of bodily function or dysfunction of bodily organs.   Routine symptom specific, illness specific and/or disease specific instructions were discussed with the patient and/or caregiver at length.   As such, the patient has been evaluated and assessed, work-up was performed and treatment was provided in alignment with urgent care protocols and evidence based medicine.  Patient/parent/caregiver has been advised that the patient may require follow up for further testing and treatment if the symptoms continue in spite of treatment, as clinically indicated and appropriate.  Patient/parent/caregiver has been advised to return to the [facility] or PCP if no better; to PCP or the Emergency Department if new signs and symptoms develop, or if the current signs or symptoms continue to change or worsen for further workup, evaluation and treatment as clinically indicated and appropriate  The patient will follow up with their current PCP if and as advised. If the patient does not currently have a PCP we will assist them in obtaining one.   The patient may need specialty follow up if the symptoms continue, in spite of conservative treatment and management, for further workup, evaluation, consultation and treatment as clinically indicated and appropriate.   Patient/parent/caregiver verbalized understanding and agreement of plan as discussed.  All questions were addressed during visit.  Please see discharge instructions below for further details of plan.  This office note has been dictated using Teaching laboratory technician.  Unfortunately, this method of dictation can sometimes lead to typographical or grammatical errors.  I apologize for your inconvenience in advance if this occurs.  Please  do not hesitate to reach out to me if clarification is needed.      Theadora Rama Scales, PA-C 01/26/22 1509    Theadora Rama Scales, PA-C 01/26/22 1510

## 2022-01-27 NOTE — Progress Notes (Signed)
New Patient Visit  BP 123/83   Pulse 82   Temp 97.8 F (36.6 C) (Temporal)   Wt 199 lb 9.6 oz (90.5 kg)   SpO2 100%   BMI 37.71 kg/m    Subjective:    Patient ID: Jamie Lopez, female    DOB: 07-Aug-1995, 26 y.o.   MRN: [id number]  CC: Chief Complaint  Patient presents with   Establish Care    Np. Est care. CHM. Pt c/o severe heart burn, acid reflux, and chest pain possibly due to medication used to treat Lupus dx    HPI: Jamie Lopez is a 26 y.o. female presents to transfer care to a new provider.  Introduced to Publishing rights manager role and practice setting.  All questions answered.  Discussed provider/patient relationship and expectations.  She was diagnosed with lupus about a year ago. She is following with rheumatology at [facility]. She is taking plaquenil and then diclofenac and meloxicam as needed.   She has been experiencing epigastric burning intermittently. Recently, it started about a week ago. She took tums and it helped for a short period of time. She takes omeprazole and pepcid. She went to urgent care 2 days ago. She was started on prevacid twice a day.   She also has a history of migraines which are stable. She takes maxalt as needed for migraine which happens about 1-2x per month.   She has a history of depression and anxiety. She is taking buspar and sertraline daily. She will take propranolol and hydroxyzine as needed. She is following with psychiatry.      01/28/2022    1:24 PM 11/25/2021    4:09 PM 08/26/2021    3:46 PM 07/13/2021   11:01 AM 05/27/2021    2:36 PM  Depression screen PHQ 2/9  Decreased Interest 0   1   Down, Depressed, Hopeless 1   1   PHQ - 2 Score 1   2   Altered sleeping 3   2   Tired, decreased energy 2   2   Change in appetite 1   2   Feeling bad or failure about yourself  1   0   Trouble concentrating 2   2   Moving slowly or fidgety/restless 1   2   Suicidal thoughts 0   0   PHQ-9 Score 11   12    Difficult doing work/chores Somewhat difficult   Not difficult at all      Information is confidential and restricted. Go to Review Flowsheets to unlock data.      01/28/2022    1:25 PM  GAD 7 : Generalized Anxiety Score  Nervous, Anxious, on Edge 2  Control/stop worrying 1  Worry too much - different things 1  Trouble relaxing 0  Restless 1  Easily annoyed or irritable 1  Afraid - awful might happen 0  Total GAD 7 Score 6  Anxiety Difficulty Somewhat difficult    Past Medical History:  Diagnosis Date   Allergy 04/1999   Seasonal allergies   Anxiety    Asthma    Depression 01/2013   GERD (gastroesophageal reflux disease) 07/2020   Headache(784.0)    Lupus (HCC)    Rosacea    Seasonal allergies     Past Surgical History:  Procedure Laterality Date   WISDOM TOOTH EXTRACTION      Family History  Problem Relation Age of Onset   Anxiety disorder Mother    Diabetes Mellitus II Mother  Depression Mother    Cancer Mother        ovarian   Diabetes Mother    Miscarriages / [place] Mother    Drug abuse Father    Asthma Father    Early death Father    Anxiety disorder Maternal Grandmother    Diabetes Mellitus II Maternal Grandmother    Depression Maternal Grandmother    Hypertension Maternal Grandmother    Anxiety disorder Maternal Grandfather    Depression Maternal Grandfather    Alcohol abuse Maternal Grandfather    Drug abuse Maternal Grandfather    Arthritis Maternal Grandfather    Alcohol abuse Paternal Grandfather    Drug abuse Paternal Grandfather    Bipolar disorder Cousin    Schizophrenia Cousin      Social History   Tobacco Use   Smoking status: Light Smoker    Types: Cigarettes, Cigars   Smokeless tobacco: Never   Tobacco comments:    once a year or less when very stressed.  Vaping Use   Vaping Use: Never used  Substance Use Topics   Alcohol use: Yes    Alcohol/week: 2.0 standard drinks of alcohol    Types: 2 Glasses of wine per week     Comment: social: 1-2x/week   Drug use: Not Currently    Types: Marijuana    Comment: quit in 2018    Current Outpatient Medications on File Prior to Visit  Medication Sig Dispense Refill   budesonide (RHINOCORT ALLERGY) 32 MCG/ACT nasal spray Place 1-2 sprays into both nostrils daily. 8.6 g 6   busPIRone (BUSPAR) 15 MG tablet Take 0.5 tablets (7.5 mg total) by mouth 2 (two) times daily. 90 tablet 0   cetirizine (ZYRTEC) 10 MG tablet Take 1 tablet (10 mg total) by mouth daily. 90 tablet 3   diclofenac (VOLTAREN) 75 MG EC tablet Take 75 mg by mouth 2 (two) times daily.     etonogestrel-ethinyl estradiol (NUVARING) 0.12-0.015 MG/24HR vaginal ring Place 1 each vaginally every 28 (twenty-eight) days. Insert vaginally and leave in place for 3 consecutive weeks, then remove for 1 week.     hydroxychloroquine (PLAQUENIL) 200 MG tablet Take 200 mg by mouth 2 (two) times daily.     hydrOXYzine (VISTARIL) 25 MG capsule TAKE 1 TO 2 CAPSULES BY MOUTH EVERY DAY AT BEDTIME AS NEEDED FOR INSOMNIA 180 capsule 0   lansoprazole (PREVACID) 30 MG capsule Take 1 capsule (30 mg total) by mouth 2 (two) times daily before a meal. 30 minutes prior to breakfast meal 60 capsule 0   meloxicam (MOBIC) 15 MG tablet Take 15 mg by mouth daily as needed for pain.     naproxen (NAPROSYN) 500 MG tablet Take 1 tablet (500 mg total) by mouth 2 (two) times daily. 30 tablet 0   Olopatadine HCl (PATADAY) 0.2 % SOLN Apply 1 drop to eye daily as needed. 2.5 mL 3   ondansetron (ZOFRAN-ODT) 4 MG disintegrating tablet Take 1 tablet (4 mg total) by mouth as needed. 20 tablet 1   propranolol (INDERAL) 10 MG tablet Take 1 tablet (10 mg total) by mouth 2 (two) times daily. 180 tablet 0   rizatriptan (MAXALT-MLT) 10 MG disintegrating tablet Take 1 tablet (10 mg total) by mouth as needed for migraine. May repeat in 2 hours if needed 90 tablet 1   sertraline (ZOLOFT) 100 MG tablet Take 2 tablets (200 mg total) by mouth daily. 180 tablet 0    triamcinolone cream (KENALOG) 0.1 % Apply 1 application topically  2 (two) times daily. 30 g 0   VENTOLIN HFA 108 (90 Base) MCG/ACT inhaler INHALE 1-2 PUFFS BY MOUTH EVERY 6 HOURS AS NEEDED FOR WHEEZE OR SHORTNESS OF BREATH 18 each 11   No current facility-administered medications on file prior to visit.     Review of Systems  Constitutional:  Positive for fatigue.  HENT: Negative.    Respiratory: Negative.    Cardiovascular: Negative.   Gastrointestinal:  Positive for abdominal pain (epigastric) and nausea. Negative for blood in stool, constipation, diarrhea and vomiting.  Genitourinary: Negative.   Musculoskeletal: Negative.   Skin: Negative.   Neurological: Negative.   Psychiatric/Behavioral: Negative.          Objective:    BP 123/83   Pulse 82   Temp 97.8 F (36.6 C) (Temporal)   Wt 199 lb 9.6 oz (90.5 kg)   SpO2 100%   BMI 37.71 kg/m   Wt Readings from Last 3 Encounters:  01/28/22 199 lb 9.6 oz (90.5 kg)  10/14/21 200 lb (90.7 kg)  07/13/21 193 lb (87.5 kg)    BP Readings from Last 3 Encounters:  01/28/22 123/83  01/26/22 111/77  10/14/21 113/70    Physical Exam Vitals and nursing note reviewed.  Constitutional:      General: She is not in acute distress.    Appearance: Normal appearance.  HENT:     Head: Normocephalic.     Right Ear: Tympanic membrane, ear canal and external ear normal.     Left Ear: Tympanic membrane, ear canal and external ear normal.  Eyes:     Conjunctiva/sclera: Conjunctivae normal.  Cardiovascular:     Rate and Rhythm: Normal rate and regular rhythm.     Pulses: Normal pulses.     Heart sounds: Normal heart sounds.  Pulmonary:     Effort: Pulmonary effort is normal.     Breath sounds: Normal breath sounds.  Abdominal:     Palpations: Abdomen is soft.     Tenderness: There is no abdominal tenderness.  Musculoskeletal:     Cervical back: Normal range of motion and neck supple. No tenderness.  Lymphadenopathy:     Cervical: No  cervical adenopathy.  Skin:    General: Skin is warm.  Neurological:     General: No focal deficit present.     Mental Status: She is alert and oriented to person, place, and time.  Psychiatric:        Mood and Affect: Mood normal.        Behavior: Behavior normal.        Thought Content: Thought content normal.        Judgment: Judgment normal.       Assessment & Plan:   Problem List Items Addressed This Visit       Cardiovascular and Mediastinum   Migraine    Chronic, stable.  She experiences about 2 migraines per month.  She takes Maxalt as needed which helps treat her symptoms.  We will continue current regimen.  Follow-up with any concerns.        Digestive   Gastroesophageal reflux disease - Primary    She has been experiencing an increase in acid reflux symptoms over the past week.  She went to urgent care and was started on Prevacid 30 mg twice a day.  She states that this was never available at her pharmacy, and started an over-the-counter Prevacid.  Her symptoms are slowly improving.  She is also taking Tums as needed.  She is concerned that she is having recurrent acid reflux episodes because if she has a lupus flare she will need to take prednisone.  Referral placed to GI.      Relevant Orders   Ambulatory referral to Gastroenterology     Other   GAD (generalized anxiety disorder)    Chronic, stable.  She is currently following with psychiatry.  She is taking BuSpar 7.5 mg twice a day, sertraline 200 mg daily, and Inderal and hydroxyzine as needed for anxiety.  Continue current regimen in collaboration with psychiatry.      Lupus (HCC)    Chronic, stable.  She is currently following with rheumatology.  She is taking Plaquenil 200 mg twice a day along with diclofenac or meloxicam as needed for pain.  Continue current regimen, follow-up with concerns.      Chronic fatigue    She is having ongoing fatigue.  She states that some nights she will get 5 hours of sleep  and then on the weekends she will get 10 hours of sleep.  Discussed keeping a regular bedtime schedule, aiming for 7 to 8 hours of sleep every night.  She can also start a vitamin B12 supplement as her levels were on the low side of normal.  Follow-up in 3 months.        Follow up plan: Return in about 3 months (around 04/29/2022) for CPE.

## 2022-01-28 ENCOUNTER — Encounter: Payer: Self-pay | Admitting: Nurse Practitioner

## 2022-01-28 ENCOUNTER — Ambulatory Visit (INDEPENDENT_AMBULATORY_CARE_PROVIDER_SITE_OTHER): Payer: BC Managed Care – PPO | Admitting: Nurse Practitioner

## 2022-01-28 VITALS — BP 123/83 | HR 82 | Temp 97.8°F | Wt 199.6 lb

## 2022-01-28 DIAGNOSIS — R5382 Chronic fatigue, unspecified: Secondary | ICD-10-CM | POA: Insufficient documentation

## 2022-01-28 DIAGNOSIS — K219 Gastro-esophageal reflux disease without esophagitis: Secondary | ICD-10-CM | POA: Diagnosis not present

## 2022-01-28 DIAGNOSIS — F411 Generalized anxiety disorder: Secondary | ICD-10-CM

## 2022-01-28 DIAGNOSIS — M329 Systemic lupus erythematosus, unspecified: Secondary | ICD-10-CM | POA: Diagnosis not present

## 2022-01-28 DIAGNOSIS — G43709 Chronic migraine without aura, not intractable, without status migrainosus: Secondary | ICD-10-CM | POA: Diagnosis not present

## 2022-01-28 NOTE — Assessment & Plan Note (Signed)
She is having ongoing fatigue.  She states that some nights she will get 5 hours of sleep and then on the weekends she will get 10 hours of sleep.  Discussed keeping a regular bedtime schedule, aiming for 7 to 8 hours of sleep every night.  She can also start a vitamin B12 supplement as her levels were on the low side of normal.  Follow-up in 3 months.

## 2022-01-28 NOTE — Patient Instructions (Signed)
It was great to see you!  Make sure you are getting 7-8 hours of sleep every night, exercise, and you can start an over the counter vitamin B12 supplement daily.   Continue prevacid daily. Let me know if you are having ongoing symptoms. I placed a referral to GI.   Let's follow-up in 3 months, sooner if you have concerns.  If a referral was placed today, you will be contacted for an appointment. Please note that routine referrals can sometimes take up to 3-4 weeks to process. Please call our office if you haven't heard anything after this time frame.  Take care,  Rodman Pickle, NP

## 2022-01-28 NOTE — Assessment & Plan Note (Signed)
Chronic, stable.  She is currently following with rheumatology.  She is taking Plaquenil 200 mg twice a day along with diclofenac or meloxicam as needed for pain.  Continue current regimen, follow-up with concerns.

## 2022-01-28 NOTE — Assessment & Plan Note (Signed)
Chronic, stable.  She is currently following with psychiatry.  She is taking BuSpar 7.5 mg twice a day, sertraline 200 mg daily, and Inderal and hydroxyzine as needed for anxiety.  Continue current regimen in collaboration with psychiatry.

## 2022-01-28 NOTE — Assessment & Plan Note (Signed)
She has been experiencing an increase in acid reflux symptoms over the past week.  She went to urgent care and was started on Prevacid 30 mg twice a day.  She states that this was never available at her pharmacy, and started an over-the-counter Prevacid.  Her symptoms are slowly improving.  She is also taking Tums as needed.  She is concerned that she is having recurrent acid reflux episodes because if she has a lupus flare she will need to take prednisone.  Referral placed to GI.

## 2022-01-28 NOTE — Assessment & Plan Note (Signed)
Chronic, stable.  She experiences about 2 migraines per month.  She takes Maxalt as needed which helps treat her symptoms.  We will continue current regimen.  Follow-up with any concerns.

## 2022-02-10 ENCOUNTER — Telehealth (HOSPITAL_BASED_OUTPATIENT_CLINIC_OR_DEPARTMENT_OTHER): Payer: BC Managed Care – PPO | Admitting: Psychiatry

## 2022-02-10 DIAGNOSIS — F331 Major depressive disorder, recurrent, moderate: Secondary | ICD-10-CM

## 2022-02-10 DIAGNOSIS — F411 Generalized anxiety disorder: Secondary | ICD-10-CM

## 2022-02-10 DIAGNOSIS — F99 Mental disorder, not otherwise specified: Secondary | ICD-10-CM

## 2022-02-10 DIAGNOSIS — F5105 Insomnia due to other mental disorder: Secondary | ICD-10-CM

## 2022-02-10 MED ORDER — SERTRALINE HCL 100 MG PO TABS: 200.0000 mg | ORAL_TABLET | Freq: Every day | ORAL | 0 refills | Status: DC

## 2022-02-10 MED ORDER — HYDROXYZINE PAMOATE 25 MG PO CAPS
ORAL_CAPSULE | ORAL | 0 refills | Status: DC
Start: 2022-02-10 — End: 2022-05-19

## 2022-02-10 MED ORDER — PROPRANOLOL HCL 10 MG PO TABS: 10.0000 mg | ORAL_TABLET | Freq: Two times a day (BID) | ORAL | 0 refills | Status: DC

## 2022-02-10 MED ORDER — BUSPIRONE HCL 10 MG PO TABS: 10.0000 mg | ORAL_TABLET | Freq: Two times a day (BID) | ORAL | 0 refills | Status: DC

## 2022-02-10 NOTE — Progress Notes (Signed)
Virtual Visit via Video Note  I connected with Jamie Lopez on 02/10/22 at  2:30 PM EDT by   a video enabled telemedicine application and verified that I am speaking with the correct person using two identifiers.  Location: Patient: home Provider: office   I discussed the limitations of evaluation and management by telemedicine and the availability of in person appointments. The patient expressed understanding and agreed to proceed.  History of Present Illness: Jamie Lopez shares that she been busy with her classes. She is in [place] school. She is stressed but feels she is handling it well. Jamie Lopez has on/off anxiety that causes fatigue. Overall she is able to deal with the anxiety. She occassionally has GI issues due to anxiety. 2 weeks ago she had severe GI upset with GERD. She went to urgent care and was started on Prevacid. It is getting better. She denies insomnia. Jamie Lopez is getting 5-6 hrs/night. She has no trouble falling asleep but always wakes up too early and can't fall back asleep. She takes Vistaril on days that she doesn't have class. She usually gets around 8 hrs with it. Her PCP wants her to sleep more. Her depression worsened this week due to school stress. She cried yesterday due to class. She wanted to isolate but didn't because she had to go to class. Jamie Lopez worked thru it. In the last last 2 weeks she has been depressed about 3-4 days and experienced some anhedonia. Over the last few days her appetite has decreased due to stress. Her focus seems to come and go. She thinks it could be due to Lupus brain fog. Jamie Lopez has been very self critical lately. It is making her depression is worse because she wants to isolate. She feels that her classmates are prejudging her because she is the only black person in her class. She is older than them as well. It makes it hard to relate to her classmates. Jamie Lopez has talked to some other friends who have dealt with the same thing. She is going to exploring minority  support groups. Jamie Lopez denies SI/HI.    Observations/Objective: Psychiatric Specialty Exam: ROS  There were no vitals taken for this visit.There is no height or weight on file to calculate BMI.  General Appearance: Casual and Fairly Groomed  Eye Contact:  Good  Speech:  Clear and Coherent and Normal Rate  Volume:  Normal  Mood:  Anxious and Depressed  Affect:  Full Range  Thought Process:  Goal Directed, Linear, and Descriptions of Associations: Intact  Orientation:  Full (Time, Place, and Person)  Thought Content:  Logical  Suicidal Thoughts:  No  Homicidal Thoughts:  No  Memory:  Immediate;   Good  Judgement:  Good  Insight:  Good  Psychomotor Activity:  Normal  Concentration:  Concentration: Good  Recall:  Good  Fund of Knowledge:  Good  Language:  Good  Akathisia:  No  Handed:  Right  AIMS (if indicated):     Assets:  Communication Skills Desire for Improvement Financial Resources/Insurance Housing Leisure Time Resilience Social Support Talents/Skills Transportation Vocational/Educational  ADL's:  Intact  Cognition:  WNL  Sleep:        Assessment and Plan:     02/10/2022    2:45 PM 01/28/2022    1:24 PM 11/25/2021    4:09 PM 08/26/2021    3:46 PM 07/13/2021   11:01 AM  Depression screen PHQ 2/9  Decreased Interest 1 0 0 0 1  Down, Depressed, Hopeless 1 1 1  0 1  PHQ - 2 Score 2 1 1  0 2  Altered sleeping 3 3   2   Tired, decreased energy 3 2   2   Change in appetite 1 1   2   Feeling bad or failure about yourself  2 1   0  Trouble concentrating 2 2   2   Moving slowly or fidgety/restless 0 1   2  Suicidal thoughts 0 0   0  PHQ-9 Score 13 11   12   Difficult doing work/chores Very difficult Somewhat difficult   Not difficult at all    Flowsheet Row Video Visit from 02/10/2022 in [facility] from 01/26/2022 in [facility] Video Visit from 11/25/2021 in [facility]  C-SSRS RISK CATEGORY No Risk No Risk No Risk         Pt is aware that these meds carry a teratogenic risk. Pt will discuss plan of action if she does or plans to become pregnant in the future.  Status of current problems: ongoing depression and anxiety that is manageable.  Meds: increase Buspar to 10mg  BID  1. GAD (generalized anxiety disorder) - busPIRone (BUSPAR) 10 MG tablet; Take 1 tablet (10 mg total) by mouth 2 (two) times daily.  Dispense: 180 tablet; Refill: 0 - propranolol (INDERAL) 10 MG tablet; Take 1 tablet (10 mg total) by mouth 2 (two) times daily.  Dispense: 180 tablet; Refill: 0 - sertraline (ZOLOFT) 100 MG tablet; Take 2 tablets (200 mg total) by mouth daily.  Dispense: 180 tablet; Refill: 0  2. Insomnia due to other mental disorder - hydrOXYzine (VISTARIL) 25 MG capsule; TAKE 1 TO 2 CAPSULES BY MOUTH EVERY DAY AT BEDTIME AS NEEDED FOR INSOMNIA  Dispense: 180 capsule; Refill: 0  3. MDD (major depressive disorder), recurrent episode, moderate (HCC) - busPIRone (BUSPAR) 10 MG tablet; Take 1 tablet (10 mg total) by mouth 2 (two) times daily.  Dispense: 180 tablet; Refill: 0 - sertraline (ZOLOFT) 100 MG tablet; Take 2 tablets (200 mg total) by mouth daily.  Dispense: 180 tablet; Refill: 0     Labs: none    Therapy: brief supportive therapy provided. Discussed psychosocial stressors in detail.      Collaboration of Care: Other minority support groups on campus  Patient/Guardian was advised Release of Information must be obtained prior to any record release in order to collaborate their care with an outside provider. Patient/Guardian was advised if they have not already done so to contact the registration department to sign all necessary forms in order for to release information regarding their care.   Consent: Patient/Guardian gives verbal consent for treatment and assignment of benefits for services provided during this visit. Patient/Guardian  expressed understanding and agreed to proceed.      Follow Up Instructions: Follow up in 2-3 months or sooner if needed    I discussed the assessment and treatment plan with the patient. The patient was provided an opportunity to ask questions and all were answered. The patient agreed with the plan and demonstrated an understanding of the instructions.   The patient was advised to call back or seek an in-person evaluation if the symptoms worsen or if the condition fails to improve as anticipated.  I provided 19 minutes of non-face-to-face time during this encounter.   02/12/2022, MD

## 2022-03-17 DIAGNOSIS — M2550 Pain in unspecified joint: Secondary | ICD-10-CM | POA: Diagnosis not present

## 2022-03-17 DIAGNOSIS — L659 Nonscarring hair loss, unspecified: Secondary | ICD-10-CM | POA: Diagnosis not present

## 2022-03-17 DIAGNOSIS — M329 Systemic lupus erythematosus, unspecified: Secondary | ICD-10-CM | POA: Diagnosis not present

## 2022-03-18 ENCOUNTER — Ambulatory Visit (INDEPENDENT_AMBULATORY_CARE_PROVIDER_SITE_OTHER): Payer: BC Managed Care – PPO

## 2022-03-18 DIAGNOSIS — Z23 Encounter for immunization: Secondary | ICD-10-CM

## 2022-03-18 NOTE — Progress Notes (Signed)
Per orders of Vance Peper, NP, pt is here for FLU VACCINE. pt received FLU VACCINE in RIGHT DELTOID. Given by Marcy Salvo . Pt tolerated VACCINE well.

## 2022-04-06 ENCOUNTER — Emergency Department (HOSPITAL_BASED_OUTPATIENT_CLINIC_OR_DEPARTMENT_OTHER)
Admission: EM | Admit: 2022-04-06 | Discharge: 2022-04-06 | Disposition: A | Discharge status: Home / Self Care | Payer: BC Managed Care – PPO | Attending: Emergency Medicine | Admitting: Emergency Medicine

## 2022-04-06 ENCOUNTER — Encounter (HOSPITAL_BASED_OUTPATIENT_CLINIC_OR_DEPARTMENT_OTHER): Payer: Self-pay

## 2022-04-06 ENCOUNTER — Emergency Department (HOSPITAL_BASED_OUTPATIENT_CLINIC_OR_DEPARTMENT_OTHER): Payer: BC Managed Care – PPO

## 2022-04-06 ENCOUNTER — Telehealth: Payer: Self-pay | Admitting: Nurse Practitioner

## 2022-04-06 ENCOUNTER — Other Ambulatory Visit: Payer: Self-pay

## 2022-04-06 DIAGNOSIS — Z91010 Allergy to peanuts: Secondary | ICD-10-CM | POA: Insufficient documentation

## 2022-04-06 DIAGNOSIS — R42 Dizziness and giddiness: Secondary | ICD-10-CM | POA: Diagnosis not present

## 2022-04-06 DIAGNOSIS — F419 Anxiety disorder, unspecified: Secondary | ICD-10-CM | POA: Insufficient documentation

## 2022-04-06 LAB — BASIC METABOLIC PANEL
Anion gap: 6 (ref 5–15)
BUN: 9 mg/dL (ref 6–20)
CO2: 21 mmol/L — ABNORMAL LOW (ref 22–32)
Calcium: 8.4 mg/dL — ABNORMAL LOW (ref 8.9–10.3)
Chloride: 107 mmol/L (ref 98–111)
Creatinine, Ser: 0.75 mg/dL (ref 0.44–1.00)
GFR, Estimated: >60 mL/min (ref >60)
Glucose, Bld: 92 mg/dL (ref 70–99)
Potassium: 4.1 mmol/L (ref 3.5–5.1)
Sodium: 134 mmol/L — ABNORMAL LOW (ref 135–145)

## 2022-04-06 LAB — CBC WITH DIFFERENTIAL/PLATELET
Abs Immature Granulocytes: 0.01 10*3/uL (ref 0.00–0.07)
Basophils Absolute: 0 10*3/uL (ref 0.0–0.1)
Basophils Relative: 0 %
Eosinophils Absolute: 0 10*3/uL (ref 0.0–0.5)
Eosinophils Relative: 1 %
HCT: 36.9 % (ref 36.0–46.0)
Hemoglobin: 11.9 g/dL — ABNORMAL LOW (ref 12.0–15.0)
Immature Granulocytes: 0 %
Lymphocytes Relative: 43 %
Lymphs Abs: 1.7 10*3/uL (ref 0.7–4.0)
MCH: 27 pg (ref 26.0–34.0)
MCHC: 32.2 g/dL (ref 30.0–36.0)
MCV: 83.7 fL (ref 80.0–100.0)
Monocytes Absolute: 0.5 10*3/uL (ref 0.1–1.0)
Monocytes Relative: 11 %
Neutro Abs: 1.8 10*3/uL (ref 1.7–7.7)
Neutrophils Relative %: 45 %
Platelets: 379 10*3/uL (ref 150–400)
RBC: 4.41 MIL/uL (ref 3.87–5.11)
RDW: 15.3 % (ref 11.5–15.5)
WBC: 4 10*3/uL (ref 4.0–10.5)
nRBC: 0 % (ref 0.0–0.2)

## 2022-04-06 LAB — URINALYSIS, ROUTINE W REFLEX MICROSCOPIC
Bilirubin Urine: NEGATIVE
Glucose, UA: NEGATIVE mg/dL
Hgb urine dipstick: NEGATIVE
Ketones, ur: NEGATIVE mg/dL
Leukocytes,Ua: NEGATIVE
Nitrite: NEGATIVE
Protein, ur: NEGATIVE mg/dL
Specific Gravity, Urine: >=1.03 (ref 1.005–1.030)
pH: 7 (ref 5.0–8.0)

## 2022-04-06 LAB — PREGNANCY, URINE: Preg Test, Ur: NEGATIVE

## 2022-04-06 MED ORDER — SODIUM CHLORIDE 0.9 % IV BOLUS
1000.0000 mL | Freq: Once | INTRAVENOUS | Status: AC
  Administered 2022-04-06: 1000 mL via INTRAVENOUS

## 2022-04-06 NOTE — Telephone Encounter (Signed)
Pt called in complaining of feeling very dizzy at two separate times today 04/06/22 and afterwards she felt like her brain was foggy. I sent her over to nurse triage.

## 2022-04-06 NOTE — Telephone Encounter (Signed)
Called and spoke to/ pt, pt was advised to go to ER, pt d/c from ER /. Will schedule hosp f/u appt with pcp

## 2022-04-06 NOTE — ED Provider Notes (Signed)
[facility] EMERGENCY DEPARTMENT Provider Note   CSN: [id number] Arrival date & time: 04/06/22  1249     History  Chief Complaint  Patient presents with   Dizziness    Jamie Lopez is a 26 y.o. female.  Patient here after couple lightheaded episodes while in school today.  Normal vitals.  No fever.  History of lupus.  No new medications.  No recent travel or surgery.  Denies any chest pain or shortness of breath currently.  Has some anxiety history as well.  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  Denies any headache, numbness, weakness, chills.  She is feeling better but still little bit lightheaded.  Does state that she does have some heavy menstrual cycles at times.  Has never needed blood transfusion or iron transfusions in the past.  Denies any weakness, numbness, vision changes.  The history is provided by the patient.       Home Medications Prior to Admission medications   Medication Sig Start Date End Date Taking? Authorizing Provider  budesonide (RHINOCORT ALLERGY) 32 MCG/ACT nasal spray Place 1-2 sprays into both nostrils daily. 08/25/18   Shade Flood, MD  busPIRone (BUSPAR) 10 MG tablet Take 1 tablet (10 mg total) by mouth 2 (two) times daily. 02/10/22   Oletta Darter, MD  cetirizine (ZYRTEC) 10 MG tablet Take 1 tablet (10 mg total) by mouth daily. 08/25/18   Shade Flood, MD  diclofenac (VOLTAREN) 75 MG EC tablet Take 75 mg by mouth 2 (two) times daily.    [provider]  etonogestrel-ethinyl estradiol (NUVARING) 0.12-0.015 MG/24HR vaginal ring Place 1 each vaginally every 28 (twenty-eight) days. Insert vaginally and leave in place for 3 consecutive weeks, then remove for 1 week.    [provider]  hydroxychloroquine (PLAQUENIL) 200 MG tablet Take 200 mg by mouth 2 (two) times daily. 06/30/20   [provider]  hydrOXYzine (VISTARIL) 25 MG capsule TAKE 1 TO 2 CAPSULES BY MOUTH EVERY DAY AT BEDTIME AS  NEEDED FOR INSOMNIA 02/10/22   Oletta Darter, MD  lansoprazole (PREVACID) 30 MG capsule Take 1 capsule (30 mg total) by mouth 2 (two) times daily before a meal. 30 minutes prior to breakfast meal 01/26/22 02/25/22  Theadora Rama Scales, PA-C  meloxicam (MOBIC) 15 MG tablet Take 15 mg by mouth daily as needed for pain.    [provider]  naproxen (NAPROSYN) 500 MG tablet Take 1 tablet (500 mg total) by mouth 2 (two) times daily. 06/01/18   Kendrick, Caitlyn S, PA-C  Olopatadine HCl (PATADAY) 0.2 % SOLN Apply 1 drop to eye daily as needed. 08/25/18   Shade Flood, MD  ondansetron (ZOFRAN-ODT) 4 MG disintegrating tablet Take 1 tablet (4 mg total) by mouth as needed. 04/06/21   Janeece Agee, NP  propranolol (INDERAL) 10 MG tablet Take 1 tablet (10 mg total) by mouth 2 (two) times daily. 02/10/22   Oletta Darter, MD  rizatriptan (MAXALT-MLT) 10 MG disintegrating tablet Take 1 tablet (10 mg total) by mouth as needed for migraine. May repeat in 2 hours if needed 01/29/21   Janeece Agee, NP  sertraline (ZOLOFT) 100 MG tablet Take 2 tablets (200 mg total) by mouth daily. 02/10/22   Oletta Darter, MD  triamcinolone cream (KENALOG) 0.1 % Apply 1 application topically 2 (two) times daily. 01/02/19   Janeece Agee, NP  VENTOLIN HFA 108 (90 Base) MCG/ACT inhaler INHALE 1-2 PUFFS BY MOUTH EVERY 6 HOURS AS NEEDED FOR  WHEEZE OR SHORTNESS OF BREATH 08/16/21   Janeece Agee, NP      Allergies    Fruit & vegetable daily [nutritional supplements], Peanut-containing drug products, and Other    Review of Systems   Review of Systems  Physical Exam Updated Vital Signs BP 128/84   Pulse 80   Temp 98 F (36.7 C) (Oral)   Resp 18   Ht 5\' 2"  (1.575 m)   Wt 90.7 kg   LMP 03/30/2022 (Approximate) Comment: nuvaring  SpO2 99%   BMI 36.58 kg/m  Physical Exam Vitals and nursing note reviewed.  Constitutional:      General: She is not in acute distress.    Appearance: She is well-developed. She is  not ill-appearing.  HENT:     Head: Normocephalic and atraumatic.     Nose: Nose normal.     Mouth/Throat:     Mouth: Mucous membranes are moist.  Eyes:     Extraocular Movements: Extraocular movements intact.     Conjunctiva/sclera: Conjunctivae normal.     Pupils: Pupils are equal, round, and reactive to light.  Cardiovascular:     Rate and Rhythm: Normal rate and regular rhythm.     Pulses: Normal pulses.     Heart sounds: Normal heart sounds. No murmur heard. Pulmonary:     Effort: Pulmonary effort is normal. No respiratory distress.     Breath sounds: Normal breath sounds.  Abdominal:     Palpations: Abdomen is soft.     Tenderness: There is no abdominal tenderness.  Musculoskeletal:        General: No swelling.     Cervical back: Normal range of motion and neck supple.  Skin:    General: Skin is warm and dry.     Capillary Refill: Capillary refill takes less than 2 seconds.  Neurological:     General: No focal deficit present.     Mental Status: She is alert and oriented to person, place, and time.     Cranial Nerves: No cranial nerve deficit.     Sensory: No sensory deficit.     Motor: No weakness.     Coordination: Coordination normal.     Comments: 5+ out of 5 strength throughout, normal sensation, no drift, normal finger-nose-finger, normal speech  Psychiatric:        Mood and Affect: Mood normal.     ED Results / Procedures / Treatments   Labs (all labs ordered are listed, but only abnormal results are displayed) Labs Reviewed  CBC WITH DIFFERENTIAL/PLATELET - Abnormal; Notable for the following components:      Result Value   Hemoglobin 11.9 (*)    All other components within normal limits  BASIC METABOLIC PANEL - Abnormal; Notable for the following components:   Sodium 134 (*)    CO2 21 (*)    Calcium 8.4 (*)    All other components within normal limits  PREGNANCY, URINE  URINALYSIS, ROUTINE W REFLEX MICROSCOPIC    EKG EKG  Interpretation  Date/Time:  Wednesday April 06 2022 13:03:15 EST Ventricular Rate:  77 PR Interval:  184 QRS Duration: 72 QT Interval:  380 QTC Calculation: 430 R Axis:   95 Text Interpretation: Normal sinus rhythm Rightward axis Borderline ECG Confirmed by [id number] ([id number]) on 04/06/2022 1:06:49 PM  Radiology DG Chest Portable 1 View  Result Date: 04/06/2022 CLINICAL DATA:  Dizzy. EXAM: PORTABLE CHEST 1 VIEW COMPARISON:  Chest x-ray 05/31/2018. FINDINGS: The heart size and mediastinal contours are within  normal limits. Both lungs are clear. No visible pleural effusions or pneumothorax. No acute osseous abnormality. IMPRESSION: No active disease. Electronically Signed   By: Feliberto Harts M.D.   On: 04/06/2022 13:35    Procedures Procedures    Medications Ordered in ED Medications  sodium chloride 0.9 % bolus 1,000 mL (1,000 mLs Intravenous New Bag/Given 04/06/22 1316)    ED Course/ Medical Decision Making/ A&P                           Medical Decision Making Amount and/or Complexity of Data Reviewed Labs: ordered. Radiology: ordered.   Yarisa Springer is here with lightheadedness.  Normal vitals.  No fever.  EKG per my review interpretation shows sinus rhythm, no ischemic changes.  Differential diagnosis of mild dehydration, stress, anemia, electrolyte abnormality, vasovagal/orthostatic type process.  I have no concern for ACS or PE or stroke given history and physical.  We will check CBC, BMP, urinalysis, urine pregnancy test and give IV fluid bolus.  Patient per my review and interpretation labs with negative pregnancy test.  No significant anemia, electrolyte abnormality, kidney injury or leukocytosis.  Feeling better after IV fluids.  Recommend follow-up with primary care doctor.  Discharged in good condition.  This chart was dictated using voice recognition software.  Despite best efforts to proofread,  errors can occur which can change the documentation meaning.          Final Clinical Impression(s) / ED Diagnoses Final diagnoses:  Lightheadedness    Rx / DC Orders ED Discharge Orders     None         Virgina Norfolk, DO 04/06/22 1416

## 2022-04-06 NOTE — Telephone Encounter (Signed)
Noted, will see her at her appointment 

## 2022-04-06 NOTE — ED Triage Notes (Addendum)
Last week had a "dizzy spell" in class. Today at 0915 sitting in a lecture and started feeling like the room was spinning and had slight chest pain and feeling uneasy for a few seconds. Nausea. Has had 3 episodes since

## 2022-04-07 DIAGNOSIS — Z79899 Other long term (current) drug therapy: Secondary | ICD-10-CM | POA: Diagnosis not present

## 2022-04-07 DIAGNOSIS — R41840 Attention and concentration deficit: Secondary | ICD-10-CM | POA: Diagnosis not present

## 2022-04-08 DIAGNOSIS — Z79899 Other long term (current) drug therapy: Secondary | ICD-10-CM | POA: Diagnosis not present

## 2022-04-08 DIAGNOSIS — R41840 Attention and concentration deficit: Secondary | ICD-10-CM | POA: Diagnosis not present

## 2022-04-11 ENCOUNTER — Telehealth: Payer: Self-pay | Admitting: Nurse Practitioner

## 2022-04-11 ENCOUNTER — Ambulatory Visit: Payer: BC Managed Care – PPO | Admitting: Nurse Practitioner

## 2022-04-11 ENCOUNTER — Encounter: Payer: Self-pay | Admitting: Family Medicine

## 2022-04-11 NOTE — Telephone Encounter (Signed)
Pt was a no show for an OV with Lauren on 04/11/22, I sent a letter.  

## 2022-04-13 ENCOUNTER — Ambulatory Visit (INDEPENDENT_AMBULATORY_CARE_PROVIDER_SITE_OTHER): Payer: BC Managed Care – PPO | Admitting: Nurse Practitioner

## 2022-04-13 ENCOUNTER — Encounter: Payer: Self-pay | Admitting: Nurse Practitioner

## 2022-04-13 VITALS — BP 120/74 | HR 86 | Temp 97.7°F | Ht 62.0 in | Wt 201.8 lb

## 2022-04-13 DIAGNOSIS — E559 Vitamin D deficiency, unspecified: Secondary | ICD-10-CM | POA: Diagnosis not present

## 2022-04-13 DIAGNOSIS — E538 Deficiency of other specified B group vitamins: Secondary | ICD-10-CM

## 2022-04-13 DIAGNOSIS — R42 Dizziness and giddiness: Secondary | ICD-10-CM

## 2022-04-13 NOTE — Patient Instructions (Signed)
It was great to see you!  We are checking your labs today and will let you know the results via mychart/phone.   Keep drinking plenty of water, you can add in a gatorade or gatorade zero once a day to help with electrolytes.   You can try the maneuver we discussed for your left ear.   Let's follow-up at your next scheduled appointment, sooner if you have concerns.  If a referral was placed today, you will be contacted for an appointment. Please note that routine referrals can sometimes take up to 3-4 weeks to process. Please call our office if you haven't heard anything after this time frame.  Take care,  Rodman Pickle, NP

## 2022-04-13 NOTE — Progress Notes (Unsigned)
   Established Patient Office Visit  Subjective   Patient ID: Jamie Lopez, female    DOB: July 13, 1995  Age: 26 y.o. MRN: [id number]  Chief Complaint  Patient presents with   Hospitalization Follow-up    Hospital f/u from 04/06/22.  C/o still having some dizziness off/on.     HPI  {History (Optional):23778}  ROS    Objective:     BP 120/74   Pulse 86   Temp 97.7 F (36.5 C) (Temporal)   Ht 5\' 2"  (1.575 m)   Wt 201 lb 12.8 oz (91.5 kg)   LMP 03/30/2022 (Approximate) Comment: nuvaring  SpO2 98%   BMI 36.91 kg/m  {Vitals History (Optional):23777}  Physical Exam   No results found for any visits on 04/13/22.  {Labs (Optional):23779}  The ASCVD Risk score (Arnett DK, et al., 2019) failed to calculate for the following reasons:   The 2019 ASCVD risk score is only valid for ages 105 to 31    Assessment & Plan:   Problem List Items Addressed This Visit   None   No follow-ups on file.    76, NP

## 2022-04-14 DIAGNOSIS — E559 Vitamin D deficiency, unspecified: Secondary | ICD-10-CM | POA: Insufficient documentation

## 2022-04-14 DIAGNOSIS — R42 Dizziness and giddiness: Secondary | ICD-10-CM | POA: Insufficient documentation

## 2022-04-14 DIAGNOSIS — R41840 Attention and concentration deficit: Secondary | ICD-10-CM | POA: Diagnosis not present

## 2022-04-14 LAB — CBC WITH DIFFERENTIAL/PLATELET
Basophils Absolute: 0 10*3/uL (ref 0.0–0.1)
Basophils Relative: 0.8 % (ref 0.0–3.0)
Eosinophils Absolute: 0 10*3/uL (ref 0.0–0.7)
Eosinophils Relative: 1.3 % (ref 0.0–5.0)
HCT: 33.4 % — ABNORMAL LOW (ref 36.0–46.0)
Hemoglobin: 11.2 g/dL — ABNORMAL LOW (ref 12.0–15.0)
Lymphocytes Relative: 38.2 % (ref 12.0–46.0)
Lymphs Abs: 1.2 10*3/uL (ref 0.7–4.0)
MCHC: 33.6 g/dL (ref 30.0–36.0)
MCV: 82.7 fl (ref 78.0–100.0)
Monocytes Absolute: 0.4 10*3/uL (ref 0.1–1.0)
Monocytes Relative: 11.5 % (ref 3.0–12.0)
Neutro Abs: 1.5 10*3/uL (ref 1.4–7.7)
Neutrophils Relative %: 48.2 % (ref 43.0–77.0)
Platelets: 369 10*3/uL (ref 150.0–400.0)
RBC: 4.04 Mil/uL (ref 3.87–5.11)
RDW: 15.2 % (ref 11.5–15.5)
WBC: 3.1 10*3/uL — ABNORMAL LOW (ref 4.0–10.5)

## 2022-04-14 LAB — VITAMIN B12: Vitamin B-12: 769 pg/mL (ref 211–911)

## 2022-04-14 LAB — BASIC METABOLIC PANEL
BUN: 9 mg/dL (ref 6–23)
CO2: 22 mEq/L (ref 19–32)
Calcium: 8.5 mg/dL (ref 8.4–10.5)
Chloride: 108 mEq/L (ref 96–112)
Creatinine, Ser: 0.64 mg/dL (ref 0.40–1.20)
GFR: 121.52 mL/min (ref >60.00)
Glucose, Bld: 113 mg/dL — ABNORMAL HIGH (ref 70–99)
Potassium: 3.8 mEq/L (ref 3.5–5.1)
Sodium: 136 mEq/L (ref 135–145)

## 2022-04-14 LAB — IRON,TIBC AND FERRITIN PANEL
%SAT: 10 % (calc) — ABNORMAL LOW (ref 16–45)
Ferritin: 8 ng/mL — ABNORMAL LOW (ref 16–154)
Iron: 48 ug/dL (ref 40–190)
TIBC: 479 mcg/dL (calc) — ABNORMAL HIGH (ref 250–450)

## 2022-04-14 LAB — TSH: TSH: 1.31 u[IU]/mL (ref 0.35–5.50)

## 2022-04-14 LAB — VITAMIN D 25 HYDROXY (VIT D DEFICIENCY, FRACTURES): VITD: 17.73 ng/mL — ABNORMAL LOW (ref 30.00–100.00)

## 2022-04-14 MED ORDER — VITAMIN D (ERGOCALCIFEROL) 1.25 MG (50000 UNIT) PO CAPS: 50000.0000 [IU] | ORAL_CAPSULE | ORAL | 0 refills | Status: DC

## 2022-04-14 NOTE — Assessment & Plan Note (Addendum)
She is having intermittent episodes of dizziness and lightheadedness. She went to the ER and her hemoglobin was noted to be slightly low. Will recheck BMP, CBC, iron panel, and TSH today. Discussed modified epley maneuver she can do at home for her left ear. She had a MRI of her brain 1 year ago which was negative. Encouraged her to keep drinking plenty of fluids and add in some electrolytes with gatorade (zero) once a day. Will have her keep her next appointment in 1 month.

## 2022-04-14 NOTE — Assessment & Plan Note (Signed)
Will check vitamin d levels and treat based on results.

## 2022-04-26 NOTE — Telephone Encounter (Signed)
1st no show, fee waived, letter sent 

## 2022-04-26 NOTE — Telephone Encounter (Signed)
Noted  

## 2022-04-28 ENCOUNTER — Encounter: Payer: BC Managed Care – PPO | Admitting: Nurse Practitioner

## 2022-04-29 ENCOUNTER — Encounter: Payer: Self-pay | Admitting: Nurse Practitioner

## 2022-04-29 ENCOUNTER — Ambulatory Visit (INDEPENDENT_AMBULATORY_CARE_PROVIDER_SITE_OTHER): Payer: BC Managed Care – PPO | Admitting: Nurse Practitioner

## 2022-04-29 VITALS — BP 118/75 | HR 93 | Temp 96.5°F | Ht 62.0 in | Wt 201.4 lb

## 2022-04-29 DIAGNOSIS — J069 Acute upper respiratory infection, unspecified: Secondary | ICD-10-CM | POA: Diagnosis not present

## 2022-04-29 DIAGNOSIS — Z0001 Encounter for general adult medical examination with abnormal findings: Secondary | ICD-10-CM

## 2022-04-29 DIAGNOSIS — M329 Systemic lupus erythematosus, unspecified: Secondary | ICD-10-CM

## 2022-04-29 DIAGNOSIS — R110 Nausea: Secondary | ICD-10-CM | POA: Diagnosis not present

## 2022-04-29 DIAGNOSIS — F411 Generalized anxiety disorder: Secondary | ICD-10-CM

## 2022-04-29 DIAGNOSIS — IMO0002 Reserved for concepts with insufficient information to code with codable children: Secondary | ICD-10-CM

## 2022-04-29 DIAGNOSIS — D500 Iron deficiency anemia secondary to blood loss (chronic): Secondary | ICD-10-CM

## 2022-04-29 DIAGNOSIS — R42 Dizziness and giddiness: Secondary | ICD-10-CM

## 2022-04-29 LAB — POC COVID19 BINAXNOW: SARS Coronavirus 2 Ag: NEGATIVE

## 2022-04-29 MED ORDER — ONDANSETRON 4 MG PO TBDP: 4.0000 mg | ORAL_TABLET | ORAL | 1 refills | Status: DC | PRN

## 2022-04-29 MED ORDER — ALBUTEROL SULFATE HFA 108 (90 BASE) MCG/ACT IN AERS: 2.0000 | INHALATION_SPRAY | Freq: Four times a day (QID) | RESPIRATORY_TRACT | 6 refills | Status: DC | PRN

## 2022-04-29 NOTE — Assessment & Plan Note (Signed)
She is going to restart her iron supplement daily. Follow-up in 3 months.

## 2022-04-29 NOTE — Assessment & Plan Note (Signed)
Chronic, stable. She is currently following with rheumatology. Continue collaboration and recommendations from specialist.

## 2022-04-29 NOTE — Assessment & Plan Note (Signed)
Improved since last visit. Continue drinking plenty of fluids.

## 2022-04-29 NOTE — Progress Notes (Signed)
BP 118/75 (BP Location: Left Arm, Patient Position: Sitting, Cuff Size: Large)   Pulse 93   Temp (!) 96.5 F (35.8 C) (Temporal)   Ht 5\' 2"  (1.575 m)   Wt 201 lb 6.4 oz (91.4 kg)   LMP 03/30/2022 (Approximate) Comment: nuvaring  SpO2 99%   BMI 36.84 kg/m    Subjective:    Patient ID: Jamie Lopez, female    DOB: February 15, 1996, 26 y.o.   MRN: [id number]  CC: Chief Complaint  Patient presents with   Annual Exam    CPE throat feeling scratchy x 2 day   HPI: Jamie Lopez is a 26 y.o. female presenting on 04/29/2022 for comprehensive medical examination. Current medical complaints include: scratchy throat  She has been experiencing a scratchy throat with nasal congestion for the past 2 days. She denies fevers and ear pain. She has had some ear itching and headaches. She states that her dizziness has improved. She has been taking over the counter medication which has helped with her symptoms.   Depression and Anxiety Screen done today and results listed below:     04/29/2022    2:49 PM 04/29/2022    1:56 PM 02/10/2022    2:45 PM 01/28/2022    1:24 PM 11/25/2021    4:09 PM  Depression screen PHQ 2/9  Decreased Interest 1 1  0   Down, Depressed, Hopeless 0 0  1   PHQ - 2 Score 1 1  1    Altered sleeping 1   3   Tired, decreased energy 1   2   Change in appetite 1   1   Feeling bad or failure about yourself  1   1   Trouble concentrating 1   2   Moving slowly or fidgety/restless 0   1   Suicidal thoughts 0   0   PHQ-9 Score 6   11   Difficult doing work/chores Somewhat difficult   Somewhat difficult      Information is confidential and restricted. Go to Review Flowsheets to unlock data.      04/29/2022    2:50 PM 01/28/2022    1:25 PM  GAD 7 : Generalized Anxiety Score  Nervous, Anxious, on Edge 1 2  Control/stop worrying 0 1  Worry too much - different things 1 1  Trouble relaxing 1 0  Restless 0 1  Easily annoyed or irritable 1 1  Afraid - awful might happen 0 0   Total GAD 7 Score 4 6  Anxiety Difficulty Somewhat difficult Somewhat difficult    The patient does not have a history of falls. I did not complete a risk assessment for falls. A plan of care for falls was not documented.   Past Medical History:  Past Medical History:  Diagnosis Date   Allergy 04/1999   Seasonal allergies   Anxiety    Asthma    Depression 01/2013   GERD (gastroesophageal reflux disease) 07/2020   Headache(784.0)    Lupus (HCC)    Rosacea    Seasonal allergies     Surgical History:  Past Surgical History:  Procedure Laterality Date   WISDOM TOOTH EXTRACTION      Medications:  Current Outpatient Medications on File Prior to Visit  Medication Sig   budesonide (RHINOCORT ALLERGY) 32 MCG/ACT nasal spray Place 1-2 sprays into both nostrils daily.   busPIRone (BUSPAR) 10 MG tablet Take 1 tablet (10 mg total) by mouth 2 (two) times daily.   cetirizine (ZYRTEC)  10 MG tablet Take 1 tablet (10 mg total) by mouth daily.   diclofenac (VOLTAREN) 75 MG EC tablet Take 75 mg by mouth 2 (two) times daily.   etonogestrel-ethinyl estradiol (NUVARING) 0.12-0.015 MG/24HR vaginal ring Place 1 each vaginally every 28 (twenty-eight) days. Insert vaginally and leave in place for 3 consecutive weeks, then remove for 1 week.   hydroxychloroquine (PLAQUENIL) 200 MG tablet Take 200 mg by mouth 2 (two) times daily.   hydrOXYzine (VISTARIL) 25 MG capsule TAKE 1 TO 2 CAPSULES BY MOUTH EVERY DAY AT BEDTIME AS NEEDED FOR INSOMNIA   meloxicam (MOBIC) 15 MG tablet Take 15 mg by mouth daily as needed for pain.   naproxen (NAPROSYN) 500 MG tablet Take 1 tablet (500 mg total) by mouth 2 (two) times daily.   Olopatadine HCl (PATADAY) 0.2 % SOLN Apply 1 drop to eye daily as needed.   propranolol (INDERAL) 10 MG tablet Take 1 tablet (10 mg total) by mouth 2 (two) times daily.   rizatriptan (MAXALT-MLT) 10 MG disintegrating tablet Take 1 tablet (10 mg total) by mouth as needed for migraine. May  repeat in 2 hours if needed   sertraline (ZOLOFT) 100 MG tablet Take 2 tablets (200 mg total) by mouth daily.   triamcinolone cream (KENALOG) 0.1 % Apply 1 application topically 2 (two) times daily.   valACYclovir (VALTREX) 1000 MG tablet Take 2,000 mg by mouth 2 (two) times daily.   Vitamin D, Ergocalciferol, (DRISDOL) 1.25 MG (50000 UNIT) CAPS capsule Take 1 capsule (50,000 Units total) by mouth every 7 (seven) days.   busPIRone (BUSPAR) 15 MG tablet Take 15 mg by mouth daily. (Patient not taking: Reported on 04/29/2022)   No current facility-administered medications on file prior to visit.    Allergies:  Allergies  Allergen Reactions   Fruit & Vegetable Daily [Nutritional Supplements]    Peanut-Containing Drug Products     Tree nut specifically    Other     Social History:  Social History   Socioeconomic History   Marital status: Single    Spouse name: Not on file   Number of children: 0   Years of education: 14   Highest education level: Not on file  Occupational History   Occupation: student    Comment: [employer]  Tobacco Use   Smoking status: Former    Types: Cigarettes, Cigars    Quit date: 2022    Years since quitting: 1.9   Smokeless tobacco: Never   Tobacco comments:    once a year or less when very stressed.  Vaping Use   Vaping Use: Never used  Substance and Sexual Activity   Alcohol use: Yes    Alcohol/week: 2.0 standard drinks of alcohol    Types: 2 Glasses of wine per week    Comment: social: 1-2x/week   Drug use: Not Currently    Types: Marijuana    Comment: quit in 2018   Sexual activity: Yes    Partners: Male    Birth control/protection: Condom, Other-see comments    Comment: NuvaRing  Other Topics Concern   Not on file  Social History Narrative   Not on file   Social Determinants of Health   Financial Resource Strain: Not on file  Food Insecurity: Not on file  Transportation Needs: Not on file  Physical Activity: Not on file  Stress: Not  on file  Social Connections: Not on file  Intimate Partner Violence: Not on file   Social History   Tobacco  Use  Smoking Status Former   Types: Cigarettes, Cigars   Quit date: 2022   Years since quitting: 1.9  Smokeless Tobacco Never  Tobacco Comments   once a year or less when very stressed.   Social History   Substance and Sexual Activity  Alcohol Use Yes   Alcohol/week: 2.0 standard drinks of alcohol   Types: 2 Glasses of wine per week   Comment: social: 1-2x/week    Family History:  Family History  Problem Relation Age of Onset   Anxiety disorder Mother    Diabetes Mellitus II Mother    Depression Mother    Cancer Mother        ovarian   Diabetes Mother    Miscarriages / [place] Mother    Drug abuse Father    Asthma Father    Early death Father    Anxiety disorder Maternal Grandmother    Diabetes Mellitus II Maternal Grandmother    Depression Maternal Grandmother    Hypertension Maternal Grandmother    Anxiety disorder Maternal Grandfather    Depression Maternal Grandfather    Alcohol abuse Maternal Grandfather    Drug abuse Maternal Grandfather    Arthritis Maternal Grandfather    Alcohol abuse Paternal Grandfather    Drug abuse Paternal Grandfather    Bipolar disorder Cousin    Schizophrenia Cousin     Past medical history, surgical history, medications, allergies, family history and social history reviewed with patient today and changes made to appropriate areas of the chart.   Review of Systems  Constitutional:  Positive for malaise/fatigue. Negative for fever.  HENT:  Positive for congestion, ear pain (itching) and sore throat (scratchy).   Eyes: Negative.   Respiratory: Negative.    Cardiovascular: Negative.   Gastrointestinal: Negative.   Genitourinary: Negative.   Musculoskeletal: Negative.   Skin: Negative.   Neurological:  Positive for dizziness (improving) and headaches.  Psychiatric/Behavioral: Negative.     All other ROS negative  except what is listed above and in the HPI.      Objective:    BP 118/75 (BP Location: Left Arm, Patient Position: Sitting, Cuff Size: Large)   Pulse 93   Temp (!) 96.5 F (35.8 C) (Temporal)   Ht 5\' 2"  (1.575 m)   Wt 201 lb 6.4 oz (91.4 kg)   LMP 03/30/2022 (Approximate) Comment: nuvaring  SpO2 99%   BMI 36.84 kg/m   Wt Readings from Last 3 Encounters:  04/29/22 201 lb 6.4 oz (91.4 kg)  04/13/22 201 lb 12.8 oz (91.5 kg)  04/06/22 200 lb (90.7 kg)    Physical Exam Vitals and nursing note reviewed.  Constitutional:      General: She is not in acute distress.    Appearance: Normal appearance.  HENT:     Head: Normocephalic and atraumatic.     Right Ear: Tympanic membrane, ear canal and external ear normal.     Left Ear: Tympanic membrane, ear canal and external ear normal.     Nose:     Right Sinus: No maxillary sinus tenderness or frontal sinus tenderness.     Left Sinus: No maxillary sinus tenderness or frontal sinus tenderness.  Eyes:     Conjunctiva/sclera: Conjunctivae normal.  Cardiovascular:     Rate and Rhythm: Normal rate and regular rhythm.     Pulses: Normal pulses.     Heart sounds: Normal heart sounds.  Pulmonary:     Effort: Pulmonary effort is normal.     Breath sounds: Normal  breath sounds.  Abdominal:     Palpations: Abdomen is soft.     Tenderness: There is no abdominal tenderness.  Musculoskeletal:        General: Normal range of motion.     Cervical back: Normal range of motion and neck supple.     Right lower leg: No edema.     Left lower leg: No edema.  Lymphadenopathy:     Cervical: No cervical adenopathy.  Skin:    General: Skin is warm and dry.  Neurological:     General: No focal deficit present.     Mental Status: She is alert and oriented to person, place, and time.     Cranial Nerves: No cranial nerve deficit.     Coordination: Coordination normal.     Gait: Gait normal.  Psychiatric:        Mood and Affect: Mood normal.         Behavior: Behavior normal.        Thought Content: Thought content normal.        Judgment: Judgment normal.     Results for orders placed or performed in visit on 04/29/22  POC COVID-19 BinaxNow  Result Value Ref Range   SARS Coronavirus 2 Ag Negative Negative      Assessment & Plan:   Problem List Items Addressed This Visit       Other   GAD (generalized anxiety disorder)    Chronic, stable. She is currently following with psychiatry. She is taking buspar 20mg  daily and sertraline 200mg  daily. She is also taking hydroxyzine and inderal as needed for anxiety. She has been having difficulty focusing in school. She was tested for ADHD and came back negative. Discussed keeping lists of assignments, minimizing distractions. She can also discuss with her psychiatrist if wellbutrin may be a good medication to add to her regimen.       Relevant Medications   busPIRone (BUSPAR) 15 MG tablet   Lupus (HCC)    Chronic, stable. She is currently following with rheumatology. Continue collaboration and recommendations from specialist.       Dizziness    Improved since last visit. Continue drinking plenty of fluids.       Iron deficiency anemia due to chronic blood loss    She is going to restart her iron supplement daily. Follow-up in 3 months.      Other Visit Diagnoses     Encounter for general adult medical examination with abnormal findings    -  Primary   Health maintenance reviewed and updated. Reviewed recent labs. Discussed nutrition, exercise. Follow-up 1 year. Requesting pap from [facility]   Upper respiratory tract infection, unspecified type       POC covid-19 negative. Encourage rest and fluids. She can continue OTC medications. F/U if not imrpoving.   Relevant Orders   POC COVID-19 BinaxNow (Completed)   Nausea       Intermittent nausea. Continue zofran 4mg  TID prn. Refill sent to the pharmacy.   Relevant Medications   ondansetron (ZOFRAN-ODT) 4 MG disintegrating  tablet        Follow up plan: Return in about 3 months (around 07/29/2022) for anemia, vit d.   LABORATORY TESTING:  - Pap smear: done elsewhere - requesting records  IMMUNIZATIONS:   - Tdap: Tetanus vaccination status reviewed: last tetanus booster within 10 years. - Influenza: Up to date - Pneumovax: Not applicable - Prevnar: Not applicable - HPV: Refused - Zostavax vaccine: Not applicable  SCREENING: -Mammogram: Not applicable  - Colonoscopy: Not applicable  - Bone Density: Not applicable  -Hearing Test: Not applicable  -Spirometry: Not applicable   PATIENT COUNSELING:   Advised to take 1 mg of folate supplement per day if capable of pregnancy.   Sexuality: Discussed sexually transmitted diseases, partner selection, use of condoms, avoidance of unintended pregnancy  and contraceptive alternatives.   Advised to avoid cigarette smoking.  I discussed with the patient that most people either abstain from alcohol or drink within safe limits (<=14/week and <=4 drinks/occasion for males, <=7/weeks and <= 3 drinks/occasion for females) and that the risk for alcohol disorders and other health effects rises proportionally with the number of drinks per week and how often a drinker exceeds daily limits.  Discussed cessation/primary prevention of drug use and availability of treatment for abuse.   Diet: Encouraged to adjust caloric intake to maintain  or achieve ideal body weight, to reduce intake of dietary saturated fat and total fat, to limit sodium intake by avoiding high sodium foods and not adding table salt, and to maintain adequate dietary potassium and calcium preferably from fresh fruits, vegetables, and low-fat dairy products.    stressed the importance of regular exercise  Injury prevention: Discussed safety belts, safety helmets, smoke detector, smoking near bedding or upholstery.   Dental health: Discussed importance of regular tooth brushing, flossing, and dental  visits.    NEXT PREVENTATIVE PHYSICAL DUE IN 1 YEAR. Return in about 3 months (around 07/29/2022) for anemia, vit d.

## 2022-04-29 NOTE — Patient Instructions (Signed)
It was great to see you!  Talk to your psychiatrist about wellbutrin or switching to a SNRI  Continue the over the counter medications you are taking.   Let's follow-up in 3 months, sooner if you have concerns.  If a referral was placed today, you will be contacted for an appointment. Please note that routine referrals can sometimes take up to 3-4 weeks to process. Please call our office if you haven't heard anything after this time frame.  Take care,  Rodman Pickle, NP

## 2022-04-29 NOTE — Assessment & Plan Note (Signed)
Chronic, stable. She is currently following with psychiatry. She is taking buspar 20mg  daily and sertraline 200mg  daily. She is also taking hydroxyzine and inderal as needed for anxiety. She has been having difficulty focusing in school. She was tested for ADHD and came back negative. Discussed keeping lists of assignments, minimizing distractions. She can also discuss with her psychiatrist if wellbutrin may be a good medication to add to her regimen.

## 2022-04-30 DIAGNOSIS — S93401A Sprain of unspecified ligament of right ankle, initial encounter: Secondary | ICD-10-CM | POA: Diagnosis not present

## 2022-04-30 DIAGNOSIS — S96911A Strain of unspecified muscle and tendon at ankle and foot level, right foot, initial encounter: Secondary | ICD-10-CM | POA: Diagnosis not present

## 2022-05-05 ENCOUNTER — Telehealth (HOSPITAL_COMMUNITY): Payer: BC Managed Care – PPO | Admitting: Psychiatry

## 2022-05-05 ENCOUNTER — Telehealth (HOSPITAL_COMMUNITY): Payer: Self-pay | Admitting: Psychiatry

## 2022-05-05 NOTE — Telephone Encounter (Signed)
Patient was not present on video platform used through mychart. I called the patient at our scheduled appointment time. There was no answer. I left a voice message for patient to call the clinic back at their convinence. There was no return phone call during out scheduled visit time. I was not able to speak with the patient today, as they were a no show for their scheduled appointment.   

## 2022-05-19 ENCOUNTER — Telehealth (HOSPITAL_BASED_OUTPATIENT_CLINIC_OR_DEPARTMENT_OTHER): Payer: BC Managed Care – PPO | Admitting: Psychiatry

## 2022-05-19 ENCOUNTER — Other Ambulatory Visit (HOSPITAL_COMMUNITY): Payer: Self-pay | Admitting: Psychiatry

## 2022-05-19 DIAGNOSIS — F411 Generalized anxiety disorder: Secondary | ICD-10-CM | POA: Diagnosis not present

## 2022-05-19 DIAGNOSIS — F5105 Insomnia due to other mental disorder: Secondary | ICD-10-CM | POA: Diagnosis not present

## 2022-05-19 DIAGNOSIS — F331 Major depressive disorder, recurrent, moderate: Secondary | ICD-10-CM

## 2022-05-19 DIAGNOSIS — F99 Mental disorder, not otherwise specified: Secondary | ICD-10-CM

## 2022-05-19 MED ORDER — HYDROXYZINE PAMOATE 25 MG PO CAPS: ORAL_CAPSULE | ORAL | 0 refills | Status: DC

## 2022-05-19 MED ORDER — BUPROPION HCL ER (XL) 150 MG PO TB24: 150.0000 mg | ORAL_TABLET | ORAL | 0 refills | Status: DC

## 2022-05-19 MED ORDER — SERTRALINE HCL 100 MG PO TABS: 200.0000 mg | ORAL_TABLET | Freq: Every day | ORAL | 0 refills | Status: DC

## 2022-05-19 MED ORDER — BUSPIRONE HCL 15 MG PO TABS: 15.0000 mg | ORAL_TABLET | Freq: Every day | ORAL | 0 refills | Status: DC

## 2022-05-19 MED ORDER — PROPRANOLOL HCL 10 MG PO TABS: 10.0000 mg | ORAL_TABLET | Freq: Two times a day (BID) | ORAL | 0 refills | Status: DC

## 2022-05-19 NOTE — Progress Notes (Signed)
Virtual Visit via Video Note  I connected with Jamie Lopez on 05/19/22 at  1:15 PM EST by  a video enabled telemedicine application and verified that I am speaking with the correct person using two identifiers.  Location: Patient: Home Provider: office   I discussed the limitations of evaluation and management by telemedicine and the availability of in person appointments. The patient expressed understanding and agreed to proceed.  History of Present Illness: Jamie Lopez shares she is feeling better now that she on break till January. The semester went well and she passed all her classes. She has deep cleaned her house and is spending time with family. Her anxiety is significantly improved now then when classes were going on. She still feels anxious in general most days. She notices that she picking at her nails, clenching her jaw or is restless. She might have some ruminating thoughts at night when she lies down. Being busy helps her anxiety a lot. Her sleep at night is not complete so she naps during the day too. She is getting about 8-9 hrs/day. Her appetite is decreased and she is only eating 2 small meals/day.. Her depression and anhedonia are a little better. She experiences it about 3-4 days in the last 2 weeks. On those days she had  crying spells, isolation, decreased energy and low motivation. She spends the day in bed. The next day she is better and will be active. She denies SI/HI. She went to [facility] and was told she was impulsive and hyperactive but didn't have ADHD. She was upset with the results and talked to her PCP about it. Her PCP recommended she look into non stimulant medication options with her psychiatrist.    Observations/Objective: Psychiatric Specialty Exam: ROS  There were no vitals taken for this visit.There is no height or weight on file to calculate BMI.  General Appearance: Neat and Well Groomed  Eye Contact:  Good  Speech:  Clear and Coherent and  Normal Rate  Volume:  Normal  Mood:  Anxious  Affect:  Full Range  Thought Process:  Goal Directed, Linear, and Descriptions of Associations: Intact  Orientation:  Full (Time, Place, and Person)  Thought Content:  Logical  Suicidal Thoughts:  No  Homicidal Thoughts:  No  Memory:  Immediate;   Good  Judgement:  Good  Insight:  Good  Psychomotor Activity:  Normal  Concentration:  Concentration: Good  Recall:  Good  Fund of Knowledge:  Good  Language:  Good  Akathisia:  No  Handed:  Right  AIMS (if indicated):     Assets:  Communication Skills Desire for Improvement Financial Resources/Insurance Housing Leisure Time Resilience Social Support Talents/Skills Transportation Vocational/Educational  ADL's:  Intact  Cognition:  WNL  Sleep:        Assessment and Plan:     05/19/2022    1:22 PM 04/29/2022    2:49 PM 04/29/2022    1:56 PM 02/10/2022    2:45 PM 01/28/2022    1:24 PM  Depression screen PHQ 2/9  Decreased Interest 1 1 1 1  0  Down, Depressed, Hopeless 1 0 0 1 1  PHQ - 2 Score 2 1 1 2 1   Altered sleeping 3 1  3 3   Tired, decreased energy 2 1  3 2   Change in appetite 3 1  1 1   Feeling bad or failure about yourself  3 1  2 1   Trouble concentrating 3 1  2 2   Moving slowly or fidgety/restless  0 0  0 1  Suicidal thoughts 0 0  0 0  PHQ-9 Score 16 6  13 11   Difficult doing work/chores Somewhat difficult Somewhat difficult  Very difficult Somewhat difficult    Flowsheet Row Video Visit from 05/19/2022 in [facility] from 04/06/2022 in [facility] Video Visit from 02/10/2022 in [facility]  C-SSRS RISK CATEGORY No Risk No Risk No Risk         Pt is aware that these meds carry a teratogenic risk. Pt will discuss plan of action if she does or plans to become pregnant in the future.  Status of current problems: ongoing issues with anxiety, depression and  focus  Meds: Start Wellbutrin for depression and concentration 1. GAD (generalized anxiety disorder) - busPIRone (BUSPAR) 15 MG tablet; Take 1 tablet (15 mg total) by mouth daily.  Dispense: 90 tablet; Refill: 0 - hydrOXYzine (VISTARIL) 25 MG capsule; TAKE 1 TO 2 CAPSULES BY MOUTH EVERY DAY AT BEDTIME AS NEEDED FOR INSOMNIA  Dispense: 180 capsule; Refill: 0 - propranolol (INDERAL) 10 MG tablet; Take 1 tablet (10 mg total) by mouth 2 (two) times daily.  Dispense: 180 tablet; Refill: 0 - sertraline (ZOLOFT) 100 MG tablet; Take 2 tablets (200 mg total) by mouth daily.  Dispense: 180 tablet; Refill: 0  2. MDD (major depressive disorder), recurrent episode, moderate (HCC) - busPIRone (BUSPAR) 15 MG tablet; Take 1 tablet (15 mg total) by mouth daily.  Dispense: 90 tablet; Refill: 0 - buPROPion (WELLBUTRIN XL) 150 MG 24 hr tablet; Take 1 tablet (150 mg total) by mouth every morning.  Dispense: 30 tablet; Refill: 0 - sertraline (ZOLOFT) 100 MG tablet; Take 2 tablets (200 mg total) by mouth daily.  Dispense: 180 tablet; Refill: 0  3. Insomnia due to other mental disorder - busPIRone (BUSPAR) 15 MG tablet; Take 1 tablet (15 mg total) by mouth daily.  Dispense: 90 tablet; Refill: 0 - hydrOXYzine (VISTARIL) 25 MG capsule; TAKE 1 TO 2 CAPSULES BY MOUTH EVERY DAY AT BEDTIME AS NEEDED FOR INSOMNIA  Dispense: 180 capsule; Refill: 0     Labs: none    Therapy: brief supportive therapy provided. Discussed psychosocial stressors in detail    Collaboration of Care: Referral or follow-up with counselor/therapist AEB she is going to call to schedule an appointment with a therapist later today  Patient/Guardian was advised Release of Information must be obtained prior to any record release in order to collaborate their care with an outside provider. Patient/Guardian was advised if they have not already done so to contact the registration department to sign all necessary forms in order for 02/12/2022 to release  information regarding their care.   Consent: Patient/Guardian gives verbal consent for treatment and assignment of benefits for services provided during this visit. Patient/Guardian expressed understanding and agreed to proceed.    Follow Up Instructions: Follow up in 2-3 weeks or sooner if needed    I discussed the assessment and treatment plan with the patient. The patient was provided an opportunity to ask questions and all were answered. The patient agreed with the plan and demonstrated an understanding of the instructions.   The patient was advised to call back or seek an in-person evaluation if the symptoms worsen or if the condition fails to improve as anticipated.  I provided 17 minutes of non-face-to-face time during this encounter.   [place], MD

## 2022-06-09 ENCOUNTER — Telehealth (HOSPITAL_BASED_OUTPATIENT_CLINIC_OR_DEPARTMENT_OTHER): Payer: BC Managed Care – PPO | Admitting: Psychiatry

## 2022-06-09 DIAGNOSIS — F331 Major depressive disorder, recurrent, moderate: Secondary | ICD-10-CM

## 2022-06-09 DIAGNOSIS — F5105 Insomnia due to other mental disorder: Secondary | ICD-10-CM | POA: Diagnosis not present

## 2022-06-09 DIAGNOSIS — F411 Generalized anxiety disorder: Secondary | ICD-10-CM | POA: Diagnosis not present

## 2022-06-09 MED ORDER — BUPROPION HCL ER (XL) 300 MG PO TB24: 300.0000 mg | ORAL_TABLET | ORAL | 0 refills | Status: DC

## 2022-06-09 NOTE — Progress Notes (Signed)
Virtual Visit via Video Note  I connected with Jamie Lopez on 06/09/22 at  3:30 PM EST by  a video enabled telemedicine application and verified that I am speaking with the correct person using two identifiers.  Location: Patient: home Provider: office   I discussed the limitations of evaluation and management by telemedicine and the availability of in person appointments. The patient expressed understanding and agreed to proceed.  History of Present Illness: Jamie Lopez shares she had a nice relaxing holiday. Classes have restarted this week. It is a lot. Her anxiety is high because school started back. She feels restless at times and will end up pacing. Her concentration is hit or miss in class. She takes Melatonin to fall asleep and usually gets about 6 hrs. Her boyfriend tells her she talks in her sleep and for 2 nights she woke up in the middle of the night and couldn't fall back asleep. Her energy is a little improved and she is exercising some. She denies any panic attacks. Jamie Lopez denies depression and anhedonia in the last 1 week. For 2 days she felt sad and had crying spells. She denies SI/HI. She sometimes forgets to take Wellbutrin and can tell because she feel sluggish. Last week she missed 2 doses. Jamie Lopez feels the Wellbutrin has helped her mood and wants to continue it.    Observations/Objective: Psychiatric Specialty Exam: ROS  There were no vitals taken for this visit.There is no height or weight on file to calculate BMI.  General Appearance: Casual and Fairly Groomed  Eye Contact:  Good  Speech:  Clear and Coherent and Normal Rate  Volume:  Normal  Mood:  Anxious  Affect:  Full Range  Thought Process:  Goal Directed, Linear, and Descriptions of Associations: Intact  Orientation:  Full (Time, Place, and Person)  Thought Content:  Logical  Suicidal Thoughts:  No  Homicidal Thoughts:  No  Memory:  Immediate;   Good  Judgement:  Good  Insight:  Good  Psychomotor Activity:  Normal   Concentration:  Concentration: Good  Recall:  Good  Fund of Knowledge:  Good  Language:  Good  Akathisia:  No  Handed:  Right  AIMS (if indicated):     Assets:  Communication Skills Desire for Improvement Financial Resources/Insurance Housing Intimacy Leisure Time Resilience Social Support Talents/Skills Transportation Vocational/Educational  ADL's:  Intact  Cognition:  WNL  Sleep:        Assessment and Plan:     06/09/2022    3:38 PM 05/19/2022    1:22 PM 04/29/2022    2:49 PM 04/29/2022    1:56 PM 02/10/2022    2:45 PM  Depression screen PHQ 2/9  Decreased Interest 0 1 1 1 1   Down, Depressed, Hopeless 1 1 0 0 1  PHQ - 2 Score 1 2 1 1 2   Altered sleeping 1 3 1  3   Tired, decreased energy 1 2 1  3   Change in appetite 0 3 1  1   Feeling bad or failure about yourself  2 3 1  2   Trouble concentrating 1 3 1  2   Moving slowly or fidgety/restless 0 0 0  0  Suicidal thoughts 0 0 0  0  PHQ-9 Score 6 16 6  13   Difficult doing work/chores Somewhat difficult Somewhat difficult Somewhat difficult  Very difficult    Flowsheet Row Video Visit from 06/09/2022 in [facility] Video Visit from 05/19/2022 in [facility] ED from 04/06/2022  in [place] CATEGORY No Risk No Risk No Risk          Pt is aware that these meds carry a teratogenic risk. Pt will discuss plan of action if she does or plans to become pregnant in the future.  Status of current problems: ongoing depression, anxiety is mostly situational. Sleep is fair most nights.    Medication management with supportive therapy. Risks and benefits, side effects and alternative treatment options discussed with patient. Pt was given an opportunity to ask questions about medication, illness, and treatment. All current psychiatric medications have been reviewed and discussed with the patient and adjusted as  clinically appropriate.  Pt verbalized understanding and verbal consent obtained for treatment.  Meds: increase Wellbutrin XL 300mg  qD for anxiety Continue Buspar 15mg  po qD Vistaril 25-50mg  po BID prn anxiety or insomnia-- she rarely takes it Propranolol 10mg  BID Zoloft 200mg  po qD 1. GAD (generalized anxiety disorder)  2. MDD (major depressive disorder), recurrent episode, moderate (HCC) - buPROPion (WELLBUTRIN XL) 300 MG 24 hr tablet; Take 1 tablet (300 mg total) by mouth every morning.  Dispense: 90 tablet; Refill: 0  3. Insomnia due to other mental disorder     Labs: none    Therapy: brief supportive therapy provided. Discussed psychosocial stressors in detail.     Collaboration of Care: Other none  Patient/Guardian was advised Release of Information must be obtained prior to any record release in order to collaborate their care with an outside provider. Patient/Guardian was advised if they have not already done so to contact the registration department to sign all necessary forms in order for [place] to release information regarding their care.   Consent: Patient/Guardian gives verbal consent for treatment and assignment of benefits for services provided during this visit. Patient/Guardian expressed understanding and agreed to proceed.      Follow Up Instructions: Follow up in 2-3 months or sooner if needed    I discussed the assessment and treatment plan with the patient. The patient was provided an opportunity to ask questions and all were answered. The patient agreed with the plan and demonstrated an understanding of the instructions.   The patient was advised to call back or seek an in-person evaluation if the symptoms worsen or if the condition fails to improve as anticipated.  I provided 12 minutes of non-face-to-face time during this encounter.   Charlcie Cradle, MD

## 2022-06-28 ENCOUNTER — Ambulatory Visit
Admission: EM | Admit: 2022-06-28 | Discharge: 2022-06-28 | Disposition: A | Discharge status: Home / Self Care | Payer: BC Managed Care – PPO | Attending: Urgent Care | Admitting: Urgent Care

## 2022-06-28 DIAGNOSIS — R070 Pain in throat: Secondary | ICD-10-CM | POA: Diagnosis not present

## 2022-06-28 DIAGNOSIS — R0982 Postnasal drip: Secondary | ICD-10-CM | POA: Insufficient documentation

## 2022-06-28 LAB — POCT RAPID STREP A (OFFICE): Rapid Strep A Screen: NEGATIVE

## 2022-06-28 MED ORDER — IBUPROFEN 600 MG PO TABS: 600.0000 mg | ORAL_TABLET | Freq: Four times a day (QID) | ORAL | 0 refills | Status: AC | PRN

## 2022-06-28 MED ORDER — PSEUDOEPHEDRINE HCL 60 MG PO TABS: 60.0000 mg | ORAL_TABLET | Freq: Three times a day (TID) | ORAL | 0 refills | Status: AC | PRN

## 2022-06-28 MED ORDER — CETIRIZINE HCL 10 MG PO TABS: 10.0000 mg | ORAL_TABLET | Freq: Every day | ORAL | 0 refills | Status: DC

## 2022-06-28 NOTE — ED Triage Notes (Addendum)
Pt c/o sore throat started this am-took motrin/tylenol ~3am-NAD-steady gait

## 2022-06-28 NOTE — Discharge Instructions (Addendum)
We will let you know about your strep culture either through MyChart or by phone.  Generally we call for positive test results.  In the meantime, use arterial seen and pseudoephedrine, ibuprofen for supportive care to help you through your symptoms.  At this stage, I suspect that this is a viral illness and in an otherwise healthy person should be able to clear the infection in a matter of 7 to 10 days.  Will update you with your results as soon as we are able to.

## 2022-06-28 NOTE — ED Provider Notes (Signed)
[facility]  Note:  This document was prepared using Systems analyst and may include unintentional dictation errors.  MRN: [id number] DOB: 12-Jan-1996  Subjective:   Jamie Lopez is a 27 y.o. female presenting for acute onset since 3 AM this morning of throat pain, painful swallowing mild to moderate in severity.  No other symptoms including fever, runny or stuffy nose, cough, ear symptoms.  Patient would like to be checked for strep as there have been cases in her class.  No current facility-administered medications for this encounter.  Current Outpatient Medications:    albuterol (VENTOLIN HFA) 108 (90 Base) MCG/ACT inhaler, Inhale 2 puffs into the lungs every 6 (six) hours as needed for wheezing or shortness of breath., Disp: 8 g, Rfl: 6   budesonide (RHINOCORT ALLERGY) 32 MCG/ACT nasal spray, Place 1-2 sprays into both nostrils daily., Disp: 8.6 g, Rfl: 6   buPROPion (WELLBUTRIN XL) 300 MG 24 hr tablet, Take 1 tablet (300 mg total) by mouth every morning., Disp: 90 tablet, Rfl: 0   busPIRone (BUSPAR) 15 MG tablet, Take 1 tablet (15 mg total) by mouth daily., Disp: 90 tablet, Rfl: 0   cetirizine (ZYRTEC) 10 MG tablet, Take 1 tablet (10 mg total) by mouth daily., Disp: 90 tablet, Rfl: 3   diclofenac (VOLTAREN) 75 MG EC tablet, Take 75 mg by mouth 2 (two) times daily., Disp: , Rfl:    etonogestrel-ethinyl estradiol (NUVARING) 0.12-0.015 MG/24HR vaginal ring, Place 1 each vaginally every 28 (twenty-eight) days. Insert vaginally and leave in place for 3 consecutive weeks, then remove for 1 week., Disp: , Rfl:    hydroxychloroquine (PLAQUENIL) 200 MG tablet, Take 200 mg by mouth 2 (two) times daily., Disp: , Rfl:    hydrOXYzine (VISTARIL) 25 MG capsule, TAKE 1 TO 2 CAPSULES BY MOUTH EVERY DAY AT BEDTIME AS NEEDED FOR INSOMNIA (Patient not taking: Reported on 06/09/2022), Disp: 180 capsule, Rfl: 0   meloxicam (MOBIC) 15 MG tablet, Take 15 mg by mouth  daily as needed for pain., Disp: , Rfl:    naproxen (NAPROSYN) 500 MG tablet, Take 1 tablet (500 mg total) by mouth 2 (two) times daily., Disp: 30 tablet, Rfl: 0   Olopatadine HCl (PATADAY) 0.2 % SOLN, Apply 1 drop to eye daily as needed., Disp: 2.5 mL, Rfl: 3   ondansetron (ZOFRAN-ODT) 4 MG disintegrating tablet, Take 1 tablet (4 mg total) by mouth as needed., Disp: 30 tablet, Rfl: 1   propranolol (INDERAL) 10 MG tablet, Take 1 tablet (10 mg total) by mouth 2 (two) times daily., Disp: 180 tablet, Rfl: 0   rizatriptan (MAXALT-MLT) 10 MG disintegrating tablet, Take 1 tablet (10 mg total) by mouth as needed for migraine. May repeat in 2 hours if needed, Disp: 90 tablet, Rfl: 1   sertraline (ZOLOFT) 100 MG tablet, Take 2 tablets (200 mg total) by mouth daily., Disp: 180 tablet, Rfl: 0   triamcinolone cream (KENALOG) 0.1 %, Apply 1 application topically 2 (two) times daily., Disp: 30 g, Rfl: 0   valACYclovir (VALTREX) 1000 MG tablet, Take 2,000 mg by mouth 2 (two) times daily., Disp: , Rfl:    Vitamin D, Ergocalciferol, (DRISDOL) 1.25 MG (50000 UNIT) CAPS capsule, Take 1 capsule (50,000 Units total) by mouth every 7 (seven) days., Disp: 12 capsule, Rfl: 0   Allergies  Allergen Reactions   Peanut-Containing Drug Products     Tree nut specifically    Other     ALL FRUIT  Past Medical History:  Diagnosis Date   Allergy 04/1999   Seasonal allergies   Anxiety    Asthma    Depression 01/2013   GERD (gastroesophageal reflux disease) 07/2020   Headache(784.0)    Lupus ([place])    Rosacea    Seasonal allergies      Past Surgical History:  Procedure Laterality Date   WISDOM TOOTH EXTRACTION      Family History  Problem Relation Age of Onset   Anxiety disorder Mother    Diabetes Mellitus II Mother    Depression Mother    Cancer Mother        ovarian   Diabetes Mother    Miscarriages / [place] Mother    Drug abuse Father    Asthma Father    Early death Father    Anxiety disorder  Maternal Grandmother    Diabetes Mellitus II Maternal Grandmother    Depression Maternal Grandmother    Hypertension Maternal Grandmother    Anxiety disorder Maternal Grandfather    Depression Maternal Grandfather    Alcohol abuse Maternal Grandfather    Drug abuse Maternal Grandfather    Arthritis Maternal Grandfather    Alcohol abuse Paternal Grandfather    Drug abuse Paternal Grandfather    Bipolar disorder Cousin    Schizophrenia Cousin     Social History   Tobacco Use   Smoking status: Former    Types: Cigarettes, Cigars    Quit date: 2022    Years since quitting: 2.0   Smokeless tobacco: Never   Tobacco comments:    once a year or less when very stressed.  Vaping Use   Vaping Use: Never used  Substance Use Topics   Alcohol use: Yes    Alcohol/week: 2.0 standard drinks of alcohol    Types: 2 Glasses of wine per week    Comment: social: 1-2x/week   Drug use: Not Currently    Types: Marijuana    Comment: quit in 2018    ROS   Objective:   Vitals: BP 113/78 (BP Location: Right Arm)   Pulse 78   Temp 99.2 F (37.3 C) (Oral)   Resp 16   LMP 06/03/2022   SpO2 96%   Physical Exam Constitutional:      General: She is not in acute distress.    Appearance: Normal appearance. She is well-developed. She is not ill-appearing, toxic-appearing or diaphoretic.  HENT:     Head: Normocephalic and atraumatic.     Nose: Nose normal.     Mouth/Throat:     Mouth: Mucous membranes are moist.     Pharynx: No pharyngeal swelling, oropharyngeal exudate, posterior oropharyngeal erythema or uvula swelling.     Tonsils: No tonsillar exudate or tonsillar abscesses. 0 on the right. 0 on the left.     Comments: Significant postnasal drainage overlying pharynx. Eyes:     General: No scleral icterus.       Right eye: No discharge.        Left eye: No discharge.     Extraocular Movements: Extraocular movements intact.  Cardiovascular:     Rate and Rhythm: Normal rate.   Pulmonary:     Effort: Pulmonary effort is normal.  Skin:    General: Skin is warm and dry.  Neurological:     General: No focal deficit present.     Mental Status: She is alert and oriented to person, place, and time.  Psychiatric:        Mood and Affect:  Mood normal.        Behavior: Behavior normal.        Thought Content: Thought content normal.        Judgment: Judgment normal.     Results for orders placed or performed during the hospital encounter of 06/28/22 (from the past 24 hour(s))  POCT rapid strep A     Status: None   Collection Time: 06/28/22  8:26 AM  Result Value Ref Range   Rapid Strep A Screen Negative Negative    Assessment and Plan :   PDMP not reviewed this encounter.  1. Post-nasal drainage   2. Throat pain     Strep culture pending.  Recommend supportive care for an acute viral pharyngitis. Counseled patient on potential for adverse effects with medications prescribed/recommended today, ER and return-to-clinic precautions discussed, patient verbalized understanding.    Jaynee Eagles, [place] 06/28/22 [phone]

## 2022-06-30 ENCOUNTER — Other Ambulatory Visit: Payer: Self-pay | Admitting: Nurse Practitioner

## 2022-07-01 LAB — CULTURE, GROUP A STREP (THRC)

## 2022-07-05 ENCOUNTER — Other Ambulatory Visit: Payer: Self-pay | Admitting: Family Medicine

## 2022-07-05 DIAGNOSIS — L309 Dermatitis, unspecified: Secondary | ICD-10-CM

## 2022-07-05 DIAGNOSIS — F411 Generalized anxiety disorder: Secondary | ICD-10-CM | POA: Diagnosis not present

## 2022-07-12 DIAGNOSIS — F411 Generalized anxiety disorder: Secondary | ICD-10-CM | POA: Diagnosis not present

## 2022-07-19 DIAGNOSIS — F411 Generalized anxiety disorder: Secondary | ICD-10-CM | POA: Diagnosis not present

## 2022-07-24 DIAGNOSIS — H60332 Swimmer's ear, left ear: Secondary | ICD-10-CM | POA: Diagnosis not present

## 2022-07-28 DIAGNOSIS — F411 Generalized anxiety disorder: Secondary | ICD-10-CM | POA: Diagnosis not present

## 2022-08-02 ENCOUNTER — Ambulatory Visit (INDEPENDENT_AMBULATORY_CARE_PROVIDER_SITE_OTHER): Payer: BC Managed Care – PPO | Admitting: Nurse Practitioner

## 2022-08-02 ENCOUNTER — Encounter: Payer: Self-pay | Admitting: Nurse Practitioner

## 2022-08-02 VITALS — BP 120/84 | HR 81 | Temp 98.6°F | Wt 195.6 lb

## 2022-08-02 DIAGNOSIS — Z111 Encounter for screening for respiratory tuberculosis: Secondary | ICD-10-CM

## 2022-08-02 DIAGNOSIS — R7301 Impaired fasting glucose: Secondary | ICD-10-CM

## 2022-08-02 DIAGNOSIS — D500 Iron deficiency anemia secondary to blood loss (chronic): Secondary | ICD-10-CM

## 2022-08-02 DIAGNOSIS — E559 Vitamin D deficiency, unspecified: Secondary | ICD-10-CM | POA: Diagnosis not present

## 2022-08-02 LAB — CBC
HCT: 35.9 % — ABNORMAL LOW (ref 36.0–46.0)
Hemoglobin: 11.9 g/dL — ABNORMAL LOW (ref 12.0–15.0)
MCHC: 33.1 g/dL (ref 30.0–36.0)
MCV: 85.6 fl (ref 78.0–100.0)
Platelets: 334 10*3/uL (ref 150.0–400.0)
RBC: 4.19 Mil/uL (ref 3.87–5.11)
RDW: 15.2 % (ref 11.5–15.5)
WBC: 3 10*3/uL — ABNORMAL LOW (ref 4.0–10.5)

## 2022-08-02 LAB — HEMOGLOBIN A1C: Hgb A1c MFr Bld: 5.9 % (ref 4.6–6.5)

## 2022-08-02 LAB — VITAMIN D 25 HYDROXY (VIT D DEFICIENCY, FRACTURES): VITD: 22.48 ng/mL — ABNORMAL LOW (ref 30.00–100.00)

## 2022-08-02 NOTE — Progress Notes (Signed)
Established Patient Office Visit  Subjective   Patient ID: Jamie Lopez, female    DOB: 30-Oct-1995  Age: 27 y.o. MRN: [id number]  Chief Complaint  Patient presents with   Anemia    Recheck anemia and vitamin D    HPI  Jamie Lopez is here to follow-up on anemia and vitamin D for deficiency.  She states that she has the 50,000 units of vitamin D weekly supplement, however she sometimes forgets to take it.  This is the same with iron supplement as well.  She was doing well with these for a few months, and then for the last few weeks she has forgotten a few times.  She has noticed some increase in energy.  She denies shortness of breath and chest pain.    ROS See pertinent positives and negatives per HPI.    Objective:     BP 120/84 (BP Location: Right Arm)   Pulse 81   Temp 98.6 F (37 C)   Wt 195 lb 9.6 oz (88.7 kg)   LMP 06/06/2022 (Approximate)   SpO2 98%   BMI 35.78 kg/m  BP Readings from Last 3 Encounters:  08/02/22 120/84  06/28/22 113/78  04/29/22 118/75   Wt Readings from Last 3 Encounters:  08/02/22 195 lb 9.6 oz (88.7 kg)  04/29/22 201 lb 6.4 oz (91.4 kg)  04/13/22 201 lb 12.8 oz (91.5 kg)      Physical Exam Vitals and nursing note reviewed.  Constitutional:      General: She is not in acute distress.    Appearance: Normal appearance.  HENT:     Head: Normocephalic.  Eyes:     Conjunctiva/sclera: Conjunctivae normal.  Cardiovascular:     Rate and Rhythm: Normal rate and regular rhythm.     Pulses: Normal pulses.     Heart sounds: Normal heart sounds.  Pulmonary:     Effort: Pulmonary effort is normal.     Breath sounds: Normal breath sounds.  Musculoskeletal:     Cervical back: Normal range of motion.  Skin:    General: Skin is warm.  Neurological:     General: No focal deficit present.     Mental Status: She is alert and oriented to person, place, and time.  Psychiatric:        Mood and Affect: Mood normal.        Behavior:  Behavior normal.        Thought Content: Thought content normal.        Judgment: Judgment normal.      Results for orders placed or performed in visit on 08/02/22  CBC  Result Value Ref Range   WBC 3.0 (L) 4.0 - 10.5 K/uL   RBC 4.19 3.87 - 5.11 Mil/uL   Platelets 334.0 150.0 - 400.0 K/uL   Hemoglobin 11.9 (L) 12.0 - 15.0 g/dL   HCT 35.9 (L) 36.0 - 46.0 %   MCV 85.6 78.0 - 100.0 fl   MCHC 33.1 30.0 - 36.0 g/dL   RDW 15.2 11.5 - 15.5 %  Hemoglobin A1c  Result Value Ref Range   Hgb A1c MFr Bld 5.9 4.6 - 6.5 %  VITAMIN D 25 Hydroxy (Vit-D Deficiency, Fractures)  Result Value Ref Range   VITD 22.48 (L) 30.00 - 100.00 ng/mL      The ASCVD Risk score (Arnett DK, et al., 2019) failed to calculate for the following reasons:   The 2019 ASCVD risk score is only valid for ages 17 to 30  Assessment & Plan:   Problem List Items Addressed This Visit       Other   Vitamin D deficiency    She is currently taking vitamin D 50,000 units weekly when she remembers.  Will check vitamin D levels today.      Relevant Orders   VITAMIN D 25 Hydroxy (Vit-D Deficiency, Fractures) (Completed)   Iron deficiency anemia due to chronic blood loss - Primary    She has been taking her iron supplement every other day for the past 2 months.  For the last 3 weeks she has forgotten a few pills intermittently.  Will check CBC and iron panel today.      Relevant Orders   CBC (Completed)   Iron, TIBC and Ferritin Panel   Other Visit Diagnoses     IFG (impaired fasting glucose)       Past glucose was slightly elevated, will check A1c today.   Relevant Orders   Hemoglobin A1c (Completed)   Screening-pulmonary TB       TB Gold done for school for screening for TB.   Relevant Orders   QuantiFERON-TB Gold Plus       Return in about 9 months (around 05/04/2023) for CPE.    Charyl Dancer, NP

## 2022-08-02 NOTE — Assessment & Plan Note (Signed)
She has been taking her iron supplement every other day for the past 2 months.  For the last 3 weeks she has forgotten a few pills intermittently.  Will check CBC and iron panel today.

## 2022-08-02 NOTE — Patient Instructions (Signed)
It was great to see you!  We are checking your labs today and will let you know the results via mychart/phone.   Keep taking the vitamin D and iron supplement.   Let's follow-up in 9 months, sooner if you have concerns.  If a referral was placed today, you will be contacted for an appointment. Please note that routine referrals can sometimes take up to 3-4 weeks to process. Please call our office if you haven't heard anything after this time frame.  Take care,  Vance Peper, NP

## 2022-08-02 NOTE — Assessment & Plan Note (Signed)
She is currently taking vitamin D 50,000 units weekly when she remembers.  Will check vitamin D levels today.

## 2022-08-04 ENCOUNTER — Telehealth (HOSPITAL_COMMUNITY): Payer: BC Managed Care – PPO | Admitting: Psychiatry

## 2022-08-05 LAB — QUANTIFERON-TB GOLD PLUS
Mitogen-NIL: 5.64 IU/mL
NIL: 0.03 IU/mL
QuantiFERON-TB Gold Plus: NEGATIVE
TB1-NIL: 0 IU/mL
TB2-NIL: 0 IU/mL

## 2022-08-05 LAB — IRON,TIBC AND FERRITIN PANEL
%SAT: 14 % (calc) — ABNORMAL LOW (ref 16–45)
Ferritin: 12 ng/mL — ABNORMAL LOW (ref 16–154)
Iron: 64 ug/dL (ref 40–190)
TIBC: 468 mcg/dL (calc) — ABNORMAL HIGH (ref 250–450)

## 2022-08-11 ENCOUNTER — Telehealth (HOSPITAL_BASED_OUTPATIENT_CLINIC_OR_DEPARTMENT_OTHER): Payer: BC Managed Care – PPO | Admitting: Psychiatry

## 2022-08-11 DIAGNOSIS — F99 Mental disorder, not otherwise specified: Secondary | ICD-10-CM

## 2022-08-11 DIAGNOSIS — F331 Major depressive disorder, recurrent, moderate: Secondary | ICD-10-CM | POA: Diagnosis not present

## 2022-08-11 DIAGNOSIS — F5105 Insomnia due to other mental disorder: Secondary | ICD-10-CM | POA: Diagnosis not present

## 2022-08-11 DIAGNOSIS — F411 Generalized anxiety disorder: Secondary | ICD-10-CM | POA: Diagnosis not present

## 2022-08-11 MED ORDER — PROPRANOLOL HCL 10 MG PO TABS: 10.0000 mg | ORAL_TABLET | Freq: Three times a day (TID) | ORAL | 0 refills | Status: DC

## 2022-08-11 MED ORDER — SERTRALINE HCL 100 MG PO TABS: 200.0000 mg | ORAL_TABLET | Freq: Every day | ORAL | 0 refills | Status: DC

## 2022-08-11 MED ORDER — BUSPIRONE HCL 15 MG PO TABS: 15.0000 mg | ORAL_TABLET | Freq: Every day | ORAL | 0 refills | Status: DC

## 2022-08-11 MED ORDER — BUPROPION HCL ER (XL) 300 MG PO TB24: 300.0000 mg | ORAL_TABLET | ORAL | 0 refills | Status: DC

## 2022-08-11 NOTE — Progress Notes (Signed)
Virtual Visit via Video Note  I connected with Jamie Lopez on 08/11/22 at  3:15 PM EDT by  a video enabled telemedicine application and verified that I am speaking with the correct person using two identifiers.  Location: Patient: at school Provider: office   I discussed the limitations of evaluation and management by telemedicine and the availability of in person appointments. The patient expressed understanding and agreed to proceed.  History of Present Illness: Jamie Lopez shares she is not doing well in school. She is putting in the work but is having trouble with retention. She is not doing well on her tests or groups. Jamie Lopez has good energy and is able to do more with the increased dose of Wellbutrin.  She feels like she is in survivor mode- running on fumes. Her sleep is fair and she is getting at least 6 hrs. She takes Melatonin to help her sleep some nights. Her boyfriend tells her she is lecturing him while sleeping. She doesn't know if she is anxious because she is always busy. She is not biting her nails but is very restless. In the last 2 weeks she has had 2 crying spells and depressed mood for several days. She is endorsing some anhedonia.  Jamie Lopez has a therapist and is working on positive coping skills. She is feeling defeated and has imposter syndrome and on/off feelings of worthlessness. Her dad's death anniversary is tomorrow. Her appetite is decreased.  She is crying more than before and feels more emotional. She denies SI/HI.    Observations/Objective: Psychiatric Specialty Exam: ROS  Last menstrual period 06/06/2022.There is no height or weight on file to calculate BMI.  General Appearance: Fairly Groomed and Neat  Eye Contact:  Good  Speech:  Clear and Coherent and Normal Rate  Volume:  Normal  Mood:  Anxious and Depressed  Affect:  Full Range  Thought Process:  Goal Directed, Linear, and Descriptions of Associations: Intact  Orientation:  Full (Time, Place, and Person)   Thought Content:  Logical  Suicidal Thoughts:  No  Homicidal Thoughts:  No  Memory:  Immediate;   Good  Judgement:  Good  Insight:  Good  Psychomotor Activity:  Normal  Concentration:  Concentration: Good  Recall:  Good  Fund of Knowledge:  Good  Language:  Good  Akathisia:  No  Handed:  Right  AIMS (if indicated):     Assets:  Communication Skills Desire for Improvement Financial Resources/Insurance Housing Intimacy Leisure Time Resilience Social Support Talents/Skills Transportation Vocational/Educational  ADL's:  Intact  Cognition:  WNL  Sleep:        Assessment and Plan:     08/11/2022    3:18 PM 06/09/2022    3:38 PM 05/19/2022    1:22 PM 04/29/2022    2:49 PM 04/29/2022    1:56 PM  Depression screen PHQ 2/9  Decreased Interest 1 0 '1 1 1  '$ Down, Depressed, Hopeless '1 1 1 '$ 0 0  PHQ - 2 Score '2 1 2 1 1  '$ Altered sleeping '1 1 3 1   '$ Tired, decreased energy 0 '1 2 1   '$ Change in appetite 3 0 3 1   Feeling bad or failure about yourself  '2 2 3 1   '$ Trouble concentrating '1 1 3 1   '$ Moving slowly or fidgety/restless 0 0 0 0   Suicidal thoughts 0 0 0 0   PHQ-9 Score '9 6 16 6   '$ Difficult doing work/chores Very difficult Somewhat difficult Somewhat difficult Somewhat difficult  Flowsheet Row Video Visit from 08/11/2022 in [facility] from 06/28/2022 in [facility]) Video Visit from 06/09/2022 in [place] No Risk No Risk No Risk          Pt is aware that these meds carry a teratogenic risk. Pt will discuss plan of action if she does or plans to become pregnant in the future.  Status of current problems: ongoing depression and anxiety    Medication management with supportive therapy. Risks and benefits, side effects and alternative treatment options discussed with patient. Pt was given an opportunity to ask questions  about medication, illness, and treatment. All current psychiatric medications have been reviewed and discussed with the patient and adjusted as clinically appropriate.  Pt verbalized understanding and verbal consent obtained for treatment.  Meds: increase Propranolol to '10mg'$  TID for anxiety and test taking if needed 1. GAD (generalized anxiety disorder) - busPIRone (BUSPAR) 15 MG tablet; Take 1 tablet (15 mg total) by mouth daily.  Dispense: 90 tablet; Refill: 0 - propranolol (INDERAL) 10 MG tablet; Take 1 tablet (10 mg total) by mouth 3 (three) times daily.  Dispense: 270 tablet; Refill: 0 - sertraline (ZOLOFT) 100 MG tablet; Take 2 tablets (200 mg total) by mouth daily.  Dispense: 180 tablet; Refill: 0  2. MDD (major depressive disorder), recurrent episode, moderate (HCC) - buPROPion (WELLBUTRIN XL) 300 MG 24 hr tablet; Take 1 tablet (300 mg total) by mouth every morning.  Dispense: 90 tablet; Refill: 0 - busPIRone (BUSPAR) 15 MG tablet; Take 1 tablet (15 mg total) by mouth daily.  Dispense: 90 tablet; Refill: 0 - sertraline (ZOLOFT) 100 MG tablet; Take 2 tablets (200 mg total) by mouth daily.  Dispense: 180 tablet; Refill: 0  3. Insomnia due to other mental disorder - busPIRone (BUSPAR) 15 MG tablet; Take 1 tablet (15 mg total) by mouth daily.  Dispense: 90 tablet; Refill: 0     Labs: none    Therapy: brief supportive therapy provided. Discussed psychosocial stressors in detail.   Reviewed ways of responding to anxiety in a productive manner    Collaboration of Care: Referral or follow-up with counselor/therapist AEB therapy qweekly  Patient/Guardian was advised Release of Information must be obtained prior to any record release in order to collaborate their care with an outside provider. Patient/Guardian was advised if they have not already done so to contact the registration department to sign all necessary forms in order for [place] to release information regarding their care.    Consent: Patient/Guardian gives verbal consent for treatment and assignment of benefits for services provided during this visit. Patient/Guardian expressed understanding and agreed to proceed.      Follow Up Instructions: Follow up in 2-3 months or sooner if needed    I discussed the assessment and treatment plan with the patient. The patient was provided an opportunity to ask questions and all were answered. The patient agreed with the plan and demonstrated an understanding of the instructions.   The patient was advised to call back or seek an in-person evaluation if the symptoms worsen or if the condition fails to improve as anticipated.  I provided 18 minutes of non-face-to-face time during this encounter.   Charlcie Cradle, MD

## 2022-08-18 DIAGNOSIS — F411 Generalized anxiety disorder: Secondary | ICD-10-CM | POA: Diagnosis not present

## 2022-08-25 DIAGNOSIS — F411 Generalized anxiety disorder: Secondary | ICD-10-CM | POA: Diagnosis not present

## 2022-08-30 DIAGNOSIS — F411 Generalized anxiety disorder: Secondary | ICD-10-CM | POA: Diagnosis not present

## 2022-09-08 ENCOUNTER — Telehealth: Payer: BC Managed Care – PPO | Admitting: Nurse Practitioner

## 2022-09-08 DIAGNOSIS — L659 Nonscarring hair loss, unspecified: Secondary | ICD-10-CM | POA: Diagnosis not present

## 2022-09-08 DIAGNOSIS — M1990 Unspecified osteoarthritis, unspecified site: Secondary | ICD-10-CM | POA: Diagnosis not present

## 2022-09-08 DIAGNOSIS — F411 Generalized anxiety disorder: Secondary | ICD-10-CM | POA: Diagnosis not present

## 2022-09-08 DIAGNOSIS — M329 Systemic lupus erythematosus, unspecified: Secondary | ICD-10-CM | POA: Diagnosis not present

## 2022-09-09 ENCOUNTER — Encounter: Payer: Self-pay | Admitting: Nurse Practitioner

## 2022-09-09 ENCOUNTER — Ambulatory Visit (INDEPENDENT_AMBULATORY_CARE_PROVIDER_SITE_OTHER): Payer: BC Managed Care – PPO | Admitting: Nurse Practitioner

## 2022-09-09 VITALS — BP 122/88 | HR 97 | Temp 98.5°F | Ht 62.0 in | Wt 195.4 lb

## 2022-09-09 DIAGNOSIS — F411 Generalized anxiety disorder: Secondary | ICD-10-CM

## 2022-09-09 DIAGNOSIS — J309 Allergic rhinitis, unspecified: Secondary | ICD-10-CM

## 2022-09-09 MED ORDER — OLOPATADINE HCL 0.2 % OP SOLN: 1.0000 [drp] | Freq: Every day | OPHTHALMIC | 3 refills | Status: DC | PRN

## 2022-09-09 NOTE — Patient Instructions (Signed)
It was great to see you!  Keep following with your therapist and psychiatrist.   Try to start exercising daily.   Let's follow-up if symptoms worsen or any concerns.   Take care,  Rodman Pickle, NP

## 2022-09-09 NOTE — Progress Notes (Signed)
Acute Office Visit  Subjective:     Patient ID: Jamie Lopez, female    DOB: [birth year], 27 y.o.   MRN: [id number]  Chief Complaint  Patient presents with   Letter for School/Work    Request to get a leave of absence letter school    HPI Patient is in today for increasing anxiety and depression.  She states that she has been having a hard time since coming up on the death anniversary of her father.  She is following with her therapist and psychiatrist regularly who has been adjusting medications in her regimen.  She states that she got a really bad place mentally and was struggling with school.  Her grades started slipping and so she would like to take a leave of absence from school.  She has already discussed this with her college and needs a letter from her PCP requesting that the leave of absence.  She denies SI/HI.     09/09/2022   11:48 AM 08/11/2022    3:18 PM 06/09/2022    3:38 PM 05/19/2022    1:22 PM 04/29/2022    2:49 PM  Depression screen PHQ 2/9  Decreased Interest 2    1  Down, Depressed, Hopeless 2    0  PHQ - 2 Score 4    1  Altered sleeping 3    1  Tired, decreased energy 1    1  Change in appetite 2    1  Feeling bad or failure about yourself  2    1  Trouble concentrating 2    1  Moving slowly or fidgety/restless 0    0  Suicidal thoughts 0    0  PHQ-9 Score 14    6  Difficult doing work/chores Very difficult    Somewhat difficult     Information is confidential and restricted. Go to Review Flowsheets to unlock data.  .    09/09/2022   11:48 AM 04/29/2022    2:50 PM 01/28/2022    1:25 PM  GAD 7 : Generalized Anxiety Score  Nervous, Anxious, on Edge 2 1 2   Control/stop worrying 2 0 1  Worry too much - different things 2 1 1   Trouble relaxing 2 1 0  Restless 3 0 1  Easily annoyed or irritable 1 1 1   Afraid - awful might happen 0 0 0  Total GAD 7 Score 12 4 6   Anxiety Difficulty Very difficult Somewhat difficult Somewhat difficult    ROS See  pertinent positives and negatives per HPI.     Objective:    BP 122/88 (BP Location: Right Arm)   Pulse 97   Temp 98.5 F (36.9 C)   Ht 5\' 2"  (1.575 m)   Wt 195 lb 6.4 oz (88.6 kg)   LMP 08/09/2022   SpO2 98%   BMI 35.74 kg/m    Physical Exam Vitals and nursing note reviewed.  Constitutional:      General: She is not in acute distress.    Appearance: Normal appearance.  HENT:     Head: Normocephalic.  Eyes:     Conjunctiva/sclera: Conjunctivae normal.  Cardiovascular:     Rate and Rhythm: Normal rate and regular rhythm.     Pulses: Normal pulses.     Heart sounds: Normal heart sounds.  Pulmonary:     Effort: Pulmonary effort is normal.     Breath sounds: Normal breath sounds.  Musculoskeletal:     Cervical back: Normal range of motion.  Skin:    General: Skin is warm.  Neurological:     General: No focal deficit present.     Mental Status: She is alert and oriented to person, place, and time.  Psychiatric:        Mood and Affect: Mood normal.        Behavior: Behavior normal.        Thought Content: Thought content normal.        Judgment: Judgment normal.     Results for orders placed or performed in visit on 09/09/22  HM PAP SMEAR  Result Value Ref Range   HM Pap smear normal         Assessment & Plan:   Problem List Items Addressed This Visit       Other   GAD (generalized anxiety disorder) - Primary    Chronic, not controlled.  Her PHQ-9 is a 14 and her GAD-7 is a 12.  She denies SI/HI.  She is currently following with psychiatry and a therapist.  She is currently taking BuSpar 15 mg daily, Wellbutrin XL 300 mg daily, sertraline 200 mg daily, and hydroxyzine 25 mg as needed.  She states that her symptoms have worsened around the anniversary of her father's death.  She is struggling in school and her grades started to slip.  She is needing a leave of absence from school and is hoping to go back next January.  Letter written for her school given today.   Continue collaboration recommendations from her therapist and psychiatrist.      Other Visit Diagnoses     Allergic rhinitis, unspecified seasonality, unspecified trigger       Relevant Medications   Olopatadine HCl (PATADAY) 0.2 % SOLN       Meds ordered this encounter  Medications   Olopatadine HCl (PATADAY) 0.2 % SOLN    Sig: Apply 1 drop to eye daily as needed.    Dispense:  2.5 mL    Refill:  3    Return if symptoms worsen or fail to improve.  Gerre Scull, NP

## 2022-09-09 NOTE — Assessment & Plan Note (Addendum)
Chronic, not controlled.  Her PHQ-9 is a 14 and her GAD-7 is a 12.  She denies SI/HI.  She is currently following with psychiatry and a therapist.  She is currently taking BuSpar 15 mg daily, Wellbutrin XL 300 mg daily, sertraline 200 mg daily, and hydroxyzine 25 mg as needed.  She states that her symptoms have worsened around the anniversary of her father's death.  She is struggling in school and her grades started to slip.  She is needing a leave of absence from school and is hoping to go back next January.  Letter written for her school given today.  Continue collaboration recommendations from her therapist and psychiatrist.

## 2022-09-13 DIAGNOSIS — F411 Generalized anxiety disorder: Secondary | ICD-10-CM | POA: Diagnosis not present

## 2022-09-20 DIAGNOSIS — F411 Generalized anxiety disorder: Secondary | ICD-10-CM | POA: Diagnosis not present

## 2022-10-03 ENCOUNTER — Encounter (HOSPITAL_COMMUNITY): Payer: Self-pay

## 2022-10-04 ENCOUNTER — Encounter (HOSPITAL_COMMUNITY): Payer: Self-pay

## 2022-10-04 ENCOUNTER — Other Ambulatory Visit: Payer: Self-pay

## 2022-10-04 ENCOUNTER — Emergency Department (HOSPITAL_COMMUNITY): Payer: BC Managed Care – PPO

## 2022-10-04 ENCOUNTER — Emergency Department (HOSPITAL_COMMUNITY)
Admission: EM | Admit: 2022-10-04 | Discharge: 2022-10-04 | Disposition: A | Discharge status: Home / Self Care | Payer: BC Managed Care – PPO | Attending: Emergency Medicine | Admitting: Emergency Medicine

## 2022-10-04 ENCOUNTER — Ambulatory Visit: Admission: EM | Admit: 2022-10-04 | Payer: BC Managed Care – PPO | Source: Home / Self Care

## 2022-10-04 DIAGNOSIS — R590 Localized enlarged lymph nodes: Secondary | ICD-10-CM | POA: Insufficient documentation

## 2022-10-04 DIAGNOSIS — J9811 Atelectasis: Secondary | ICD-10-CM | POA: Diagnosis not present

## 2022-10-04 DIAGNOSIS — J45909 Unspecified asthma, uncomplicated: Secondary | ICD-10-CM | POA: Insufficient documentation

## 2022-10-04 DIAGNOSIS — Z91010 Allergy to peanuts: Secondary | ICD-10-CM | POA: Insufficient documentation

## 2022-10-04 DIAGNOSIS — R079 Chest pain, unspecified: Secondary | ICD-10-CM | POA: Insufficient documentation

## 2022-10-04 DIAGNOSIS — R0789 Other chest pain: Secondary | ICD-10-CM | POA: Diagnosis not present

## 2022-10-04 LAB — CBC
HCT: 37.2 % (ref 36.0–46.0)
Hemoglobin: 12.3 g/dL (ref 12.0–15.0)
MCH: 28.7 pg (ref 26.0–34.0)
MCHC: 33.1 g/dL (ref 30.0–36.0)
MCV: 86.9 fL (ref 80.0–100.0)
Platelets: 359 10*3/uL (ref 150–400)
RBC: 4.28 MIL/uL (ref 3.87–5.11)
RDW: 13.5 % (ref 11.5–15.5)
WBC: 3.9 10*3/uL — ABNORMAL LOW (ref 4.0–10.5)
nRBC: 0 % (ref 0.0–0.2)

## 2022-10-04 LAB — BASIC METABOLIC PANEL
Anion gap: 7 (ref 5–15)
BUN: 13 mg/dL (ref 6–20)
CO2: 21 mmol/L — ABNORMAL LOW (ref 22–32)
Calcium: 8.7 mg/dL — ABNORMAL LOW (ref 8.9–10.3)
Chloride: 106 mmol/L (ref 98–111)
Creatinine, Ser: 0.71 mg/dL (ref 0.44–1.00)
GFR, Estimated: >60 mL/min (ref >60)
Glucose, Bld: 99 mg/dL (ref 70–99)
Potassium: 3.6 mmol/L (ref 3.5–5.1)
Sodium: 134 mmol/L — ABNORMAL LOW (ref 135–145)

## 2022-10-04 LAB — D-DIMER, QUANTITATIVE: D-Dimer, Quant: 0.79 ug/mL-FEU — ABNORMAL HIGH (ref 0.00–0.50)

## 2022-10-04 LAB — TROPONIN I (HIGH SENSITIVITY)
Troponin I (High Sensitivity): <2 ng/L (ref <18)
Troponin I (High Sensitivity): <2 ng/L (ref <18)

## 2022-10-04 LAB — I-STAT BETA HCG BLOOD, ED (MC, WL, AP ONLY): I-stat hCG, quantitative: <5 m[IU]/mL (ref <5)

## 2022-10-04 MED ORDER — FAMOTIDINE 20 MG PO TABS
20.0000 mg | ORAL_TABLET | Freq: Once | ORAL | Status: AC
  Administered 2022-10-04: 20 mg via ORAL
  Filled 2022-10-04: qty 1

## 2022-10-04 MED ORDER — OXYCODONE-ACETAMINOPHEN 5-325 MG PO TABS
1.0000 | ORAL_TABLET | Freq: Once | ORAL | Status: AC
  Administered 2022-10-04: 1 via ORAL
  Filled 2022-10-04: qty 1

## 2022-10-04 MED ORDER — ALUM & MAG HYDROXIDE-SIMETH 200-200-20 MG/5ML PO SUSP
30.0000 mL | Freq: Once | ORAL | Status: AC
  Administered 2022-10-04: 30 mL via ORAL
  Filled 2022-10-04: qty 30

## 2022-10-04 MED ORDER — OXYCODONE-ACETAMINOPHEN 5-325 MG PO TABS: 1.0000 | ORAL_TABLET | Freq: Four times a day (QID) | ORAL | 0 refills | Status: DC | PRN

## 2022-10-04 MED ORDER — LIDOCAINE VISCOUS HCL 2 % MT SOLN
15.0000 mL | Freq: Once | OROMUCOSAL | Status: AC
  Administered 2022-10-04: 15 mL via ORAL
  Filled 2022-10-04: qty 15

## 2022-10-04 MED ORDER — IOHEXOL 350 MG/ML SOLN
75.0000 mL | Freq: Once | INTRAVENOUS | Status: AC | PRN
  Administered 2022-10-04: 80 mL via INTRAVENOUS

## 2022-10-04 NOTE — Discharge Instructions (Addendum)
Your CT scan today showed bilateral axillary and subpectoral lymphadenopathy. Please follow up with your primary care physician as soon as possible for further workup and evaluation. Repeat CT chest is recommended to ensure resolution. Please take tylenol/ibuprofen or Percocet as needed for pain. I recommend close follow-up with PCP for reevaluation.  Please do not hesitate to return to emergency department if worrisome signs symptoms we discussed become apparent.

## 2022-10-04 NOTE — ED Triage Notes (Signed)
Patient woke up with chest pain around her left breast. Worsening with inhaling and exhaling. Pain radiates to her left upper back and neck. Never had a blood clot before. No nausea or vomiting.

## 2022-10-05 ENCOUNTER — Telehealth: Payer: Self-pay

## 2022-10-05 DIAGNOSIS — N644 Mastodynia: Secondary | ICD-10-CM | POA: Diagnosis not present

## 2022-10-05 NOTE — ED Provider Notes (Signed)
[place] EMERGENCY DEPARTMENT AT [facility] Provider Note   CSN: [id number] Arrival date & time: 10/04/22  1020     History  Chief Complaint  Patient presents with   Chest Pain    Jamie Lopez is a 27 y.o. female with a past medical history of anxiety, asthma, GERD, lupus presents today for evaluation of chest pain.  Patient states she woke up with chest pain around 6 AM today.  Pain is located in the center of her chest and under the left breast radiating to her left shoulder.  States the pain is worse with deep breathing and prolonged sitting.  Denies diaphoresis, arm weakness or numbness, neck pain, jaw pain, and back pain.  Said she has history of GERD but the pain feels differently this time.  Denies fever, nausea, vomiting.  Denies any personal or family history of PE/DVT.  Denies recent travel, surgery, OCP use, cough, leg swelling.  Patient does use a NuvaRing.  Denies smoking.  Chest Pain     Past Medical History:  Diagnosis Date   Allergy 04/1999   Seasonal allergies   Anxiety    Asthma    Depression 01/2013   GERD (gastroesophageal reflux disease) 07/2020   Headache(784.0)    Lupus (HCC)    Rosacea    Seasonal allergies    Past Surgical History:  Procedure Laterality Date   WISDOM TOOTH EXTRACTION       Home Medications Prior to Admission medications   Medication Sig Start Date End Date Taking? Authorizing Provider  oxyCODONE-acetaminophen (PERCOCET/ROXICET) 5-325 MG tablet Take 1 tablet by mouth every 6 (six) hours as needed for severe pain. 10/04/22  Yes Jeanelle Malling, PA  albuterol (VENTOLIN HFA) 108 (90 Base) MCG/ACT inhaler Inhale 2 puffs into the lungs every 6 (six) hours as needed for wheezing or shortness of breath. 04/29/22   McElwee, Lauren A, NP  budesonide (RHINOCORT ALLERGY) 32 MCG/ACT nasal spray Place 1-2 sprays into both nostrils daily. 08/25/18   Shade Flood, MD  buPROPion (WELLBUTRIN XL) 300 MG 24 hr tablet Take 1 tablet (300 mg  total) by mouth every morning. 08/11/22 08/11/23  Oletta Darter, MD  busPIRone (BUSPAR) 15 MG tablet Take 1 tablet (15 mg total) by mouth daily. 08/11/22   Oletta Darter, MD  cetirizine (ZYRTEC ALLERGY) 10 MG tablet Take 1 tablet (10 mg total) by mouth daily. 06/28/22   Wallis Bamberg, PA-C  diclofenac (VOLTAREN) 75 MG EC tablet Take 75 mg by mouth 2 (two) times daily.    [provider]  etonogestrel-ethinyl estradiol (NUVARING) 0.12-0.015 MG/24HR vaginal ring Place 1 each vaginally every 28 (twenty-eight) days. Insert vaginally and leave in place for 3 consecutive weeks, then remove for 1 week.    [provider]  hydroxychloroquine (PLAQUENIL) 200 MG tablet Take 200 mg by mouth 2 (two) times daily. 06/30/20   [provider]  hydrOXYzine (VISTARIL) 25 MG capsule TAKE 1 TO 2 CAPSULES BY MOUTH EVERY DAY AT BEDTIME AS NEEDED FOR INSOMNIA 05/19/22   Oletta Darter, MD  ibuprofen (ADVIL) 600 MG tablet Take 1 tablet (600 mg total) by mouth every 6 (six) hours as needed. 06/28/22   Wallis Bamberg, PA-C  meloxicam (MOBIC) 15 MG tablet Take 15 mg by mouth daily as needed for pain.    [provider]  naproxen (NAPROSYN) 500 MG tablet Take 1 tablet (500 mg total) by mouth 2 (two) times daily. 06/01/18   Kendrick, Caitlyn S, PA-C  Olopatadine HCl (PATADAY)  0.2 % SOLN Apply 1 drop to eye daily as needed. 09/09/22   McElwee, Lauren A, NP  ondansetron (ZOFRAN-ODT) 4 MG disintegrating tablet Take 1 tablet (4 mg total) by mouth as needed. 04/29/22   McElwee, Jake Church, NP  propranolol (INDERAL) 10 MG tablet Take 1 tablet (10 mg total) by mouth 3 (three) times daily. 08/11/22   Oletta Darter, MD  pseudoephedrine (SUDAFED) 60 MG tablet Take 1 tablet (60 mg total) by mouth every 8 (eight) hours as needed for congestion. 06/28/22   Wallis Bamberg, PA-C  rizatriptan (MAXALT-MLT) 10 MG disintegrating tablet Take 1 tablet (10 mg total) by mouth as needed for migraine. May repeat in 2 hours if needed  01/29/21   Janeece Agee, NP  sertraline (ZOLOFT) 100 MG tablet Take 2 tablets (200 mg total) by mouth daily. 08/11/22   Oletta Darter, MD  triamcinolone cream (KENALOG) 0.1 % Apply 1 application topically 2 (two) times daily. 01/02/19   Janeece Agee, NP  valACYclovir (VALTREX) 1000 MG tablet Take 2,000 mg by mouth 2 (two) times daily.    [provider]  Vitamin D, Ergocalciferol, (DRISDOL) 1.25 MG (50000 UNIT) CAPS capsule Take 1 capsule (50,000 Units total) by mouth every 7 (seven) days. 04/14/22   McElwee, Jake Church, NP      Allergies    Peanut-containing drug products and Other    Review of Systems   Review of Systems  Cardiovascular:  Positive for chest pain.    Physical Exam Updated Vital Signs BP 128/84 (BP Location: Right Arm)   Pulse 89   Temp 98.5 F (36.9 C) (Oral)   Resp 18   Ht 5\' 2"  (1.575 m)   Wt 88.5 kg   LMP 08/09/2022 Comment: patient on birthcontrol, shiedled patient-skipped period due to birthcontrol 10/04/2022  SpO2 98%   BMI 35.67 kg/m  Physical Exam Vitals and nursing note reviewed.  Constitutional:      Appearance: Normal appearance.  HENT:     Head: Normocephalic and atraumatic.     Mouth/Throat:     Mouth: Mucous membranes are moist.  Eyes:     General: No scleral icterus. Cardiovascular:     Rate and Rhythm: Normal rate and regular rhythm.     Pulses: Normal pulses.     Heart sounds: Normal heart sounds.  Pulmonary:     Effort: Pulmonary effort is normal.     Breath sounds: Normal breath sounds.  Abdominal:     General: Abdomen is flat.     Palpations: Abdomen is soft.     Tenderness: There is no abdominal tenderness.  Musculoskeletal:        General: No deformity.  Skin:    General: Skin is warm.     Findings: No rash.  Neurological:     General: No focal deficit present.     Mental Status: She is alert.  Psychiatric:        Mood and Affect: Mood normal.     ED Results / Procedures / Treatments   Labs (all labs  ordered are listed, but only abnormal results are displayed) Labs Reviewed  BASIC METABOLIC PANEL - Abnormal; Notable for the following components:      Result Value   Sodium 134 (*)    CO2 21 (*)    Calcium 8.7 (*)    All other components within normal limits  CBC - Abnormal; Notable for the following components:   WBC 3.9 (*)    All other components within normal  limits  D-DIMER, QUANTITATIVE - Abnormal; Notable for the following components:   D-Dimer, Quant 0.79 (*)    All other components within normal limits  I-STAT BETA HCG BLOOD, ED ([facility], [facility], [facility] ONLY)  TROPONIN I (HIGH SENSITIVITY)  TROPONIN I (HIGH SENSITIVITY)    EKG EKG Interpretation  Date/Time:  Tuesday Oct 04 2022 10:28:45 EDT Ventricular Rate:  75 PR Interval:  194 QRS Duration: 91 QT Interval:  385 QTC Calculation: 430 R Axis:   67 Text Interpretation: Sinus rhythm Baseline wander in lead(s) V1 V6 Confirmed by Alvester Chou [id number]) on 10/04/2022 11:53:02 AM  Radiology CT Angio Chest PE W and/or Wo Contrast  Result Date: 10/04/2022 CLINICAL DATA:  Pain around the left breast. Worse with breathing. Pain radiates to left upper back and neck. EXAM: CT ANGIOGRAPHY CHEST WITH CONTRAST TECHNIQUE: Multidetector CT imaging of the chest was performed using the standard protocol during bolus administration of intravenous contrast. Multiplanar CT image reconstructions and MIPs were obtained to evaluate the vascular anatomy. RADIATION DOSE REDUCTION: This exam was performed according to the departmental dose-optimization program which includes automated exposure control, adjustment of the mA and/or kV according to patient size and/or use of iterative reconstruction technique. CONTRAST:  80mL OMNIPAQUE IOHEXOL 350 MG/ML SOLN COMPARISON:  None Available. FINDINGS: Cardiovascular: The heart size is normal. No substantial pericardial effusion. No thoracic aortic aneurysm. No substantial atherosclerosis of the thoracic aorta. There is no  filling defect within the opacified pulmonary arteries to suggest the presence of an acute pulmonary embolus. Mediastinum/Nodes: 11 mm short axis anterior mediastinal lymph node evident. Upper normal subcarinal lymph nodes. No hilar lymphadenopathy. The esophagus has normal imaging features. Bilateral axillary and subpectoral lymphadenopathy. Index right axillary node measures 1.2 cm short axis on image 30/10. Deep right axillary node on 34/10 is 12 mm short axis. Left subpectoral node measures 11 mm short axis on 35/10. Lungs/Pleura: Dependent atelectasis noted in the lung bases. No pneumothorax or pleural effusion. No findings to suggest overt pulmonary edema. No suspicious pulmonary nodule or mass. Upper Abdomen: Unremarkable. Musculoskeletal: No worrisome lytic or sclerotic osseous abnormality. Review of the MIP images confirms the above findings. IMPRESSION: 1. No CT evidence for acute pulmonary embolus. 2. Bilateral axillary and subpectoral lymphadenopathy with borderline mediastinal lymphadenopathy. Imaging features are nonspecific and may be reactive, but metastatic disease or lymphoproliferative disorder not excluded. Axillary lymphadenopathy should be amenable to clinical inspection. CT abdomen pelvis could be performed to further evaluate as clinically warranted. Repeat CT chest in 3 months recommended to ensure resolution. 3. Dependent atelectasis in the lung bases. Electronically Signed   By: Kennith Center M.D.   On: 10/04/2022 14:35   DG Chest 2 View  Result Date: 10/04/2022 CLINICAL DATA:  Awoke with chest pain around LEFT breast EXAM: CHEST - 2 VIEW COMPARISON:  04/06/2022 FINDINGS: Normal heart size, mediastinal contours, and pulmonary vascularity. Lungs clear. No pleural effusion or pneumothorax. Bones unremarkable. IMPRESSION: Normal exam. Electronically Signed   By: Ulyses Southward M.D.   On: 10/04/2022 10:55    Procedures Procedures    Medications Ordered in ED Medications  famotidine  (PEPCID) tablet 20 mg (20 mg Oral Given 10/04/22 1256)  alum & mag hydroxide-simeth (MAALOX/MYLANTA) 200-200-20 MG/5ML suspension 30 mL (30 mLs Oral Given 10/04/22 1256)    And  lidocaine (XYLOCAINE) 2 % viscous mouth solution 15 mL (15 mLs Oral Given 10/04/22 1256)  oxyCODONE-acetaminophen (PERCOCET/ROXICET) 5-325 MG per tablet 1 tablet (1 tablet Oral Given 10/04/22 1600)  iohexol (OMNIPAQUE)  350 MG/ML injection 75 mL (80 mLs Intravenous Contrast Given 10/04/22 1413)    ED Course/ Medical Decision Making/ A&P                             Medical Decision Making Amount and/or Complexity of Data Reviewed Labs: ordered. Radiology: ordered.  Risk OTC drugs. Prescription drug management.   This patient presents to the ED for chest pain, this involves an extensive number of treatment options, and is a complaint that carries with a high risk of complications and morbidity.  The differential diagnosis includes ACS, pericarditis, PE, pneumothorax, pneumonia, less likely dissection with essentially normal blood pressure, symmetric bilateral pulses, and no back pain.  This is not an exhaustive list.  Lab tests: I ordered and personally interpreted labs.  The pertinent results include: WBC unremarkable. Hbg unremarkable. Platelets unremarkable. Electrolytes unremarkable. BUN, creatinine unremarkable.  Delta troponin negative.  D-dimer 0.79.  Imaging studies: I ordered imaging studies. I personally reviewed, interpreted imaging and agree with the radiologist's interpretations. The results include: CT angio chest showed no evidence of PE.  It did show bilateral axillary lymphadenopathy and subpectoral lymphadenopathy.  Problem list/ ED course/ Critical interventions/ Medical management: HPI: See above Vital signs within normal range and stable throughout visit. Laboratory/imaging studies significant for: See above. On physical examination, patient is afebrile and appears in no acute distress. Exam without  evidence of volume overload so doubt heart failure. EKG without signs of active ischemia. Given the timing of pain to ER presentation, delta troponin was negative so doubt NSTEMI. Presentation not consistent with acute PE (negative CT angio chest for PE), pneumothorax (not visualized on chest xr), thoracic aortic dissection, pericarditis, tamponade, pneumonia (no infectious symptoms, clear chest xr), myocarditis (no recent illness, neg trop). HEART score: 1 so plan to discharge patient home with PCP.  I discussed at length with the patient about the CT scan results which show bilateral axillary lymphadenopathy and subpectoral lymphadenopathy.  Based on radiology's interpretation, this is nonspecific and may be reactive, but metastatic disease or lymphoproliferative disorder cannot be excluded and repeat CT chest, abdomen is recommended.  This can be done in outpatient setting.  All questions answered.  Advised patient to follow-up with her primary care physician as soon as possible for reevaluation.  Patient is aware of the results and is agreeable to the plan.  Strict return precaution discussed.  I have reviewed the patient home medicines and have made adjustments as needed.  Cardiac monitoring/EKG: The patient was maintained on a cardiac monitor.  I personally reviewed and interpreted the cardiac monitor which showed an underlying rhythm of: sinus rhythm.  Additional history obtained: External records from outside source obtained and reviewed including: Chart review including previous notes, labs, imaging.  Consultations obtained:  Disposition Continued outpatient therapy. Follow-up with PCP recommended for reevaluation of symptoms. Treatment plan discussed with patient.  Pt acknowledged understanding was agreeable to the plan. Worrisome signs and symptoms were discussed with patient, and patient acknowledged understanding to return to the ED if they noticed these signs and symptoms. Patient was  stable upon discharge.   This chart was dictated using voice recognition software.  Despite best efforts to proofread,  errors can occur which can change the documentation meaning.          Final Clinical Impression(s) / ED Diagnoses Final diagnoses:  Chest pain, unspecified type  Axillary lymphadenopathy    Rx / DC Orders ED Discharge  Orders          Ordered    oxyCODONE-acetaminophen (PERCOCET/ROXICET) 5-325 MG tablet  Every 6 hours PRN        10/04/22 1516              Jeanelle Malling, PA 10/05/22 0981    Terald Sleeper, MD 10/05/22 [phone]

## 2022-10-05 NOTE — Transitions of Care (Post Inpatient/ED Visit) (Signed)
10/05/2022  Name: Jamie Lopez MRN: [id number] DOB: March 20, 1996  Today's TOC FU Call Status: Today's TOC FU Call Status:: Successful TOC FU Call Competed TOC FU Call Complete Date: 10/05/22  Transition Care Management Follow-up Telephone Call Date of Discharge: 10/04/22 Discharge Facility: [facility]) Type of Discharge: Emergency Department How have you been since you were released from the hospital?: Same Any questions or concerns?: Yes Patient Questions/Concerns:: Still having pain, I want to find out what is going on  Items Reviewed: Did you receive and understand the discharge instructions provided?: Yes Medications obtained,verified, and reconciled?: Yes (Medications Reviewed) Any new allergies since your discharge?: No Dietary orders reviewed?: No Do you have support at home?: Yes  Medications Reviewed Today: Medications Reviewed Today     Reviewed by Jamie Lopez (Nurse Practitioner) on 09/09/22 at 1151  Med List Status: <None>   Medication Order Taking? Sig Documenting Provider Last Dose Status Informant  albuterol (VENTOLIN HFA) 108 (90 Base) MCG/ACT inhaler [id number] Yes Inhale 2 puffs into the lungs every 6 (six) hours as needed for wheezing or shortness of breath. Jamie Lopez Taking Active   budesonide (RHINOCORT ALLERGY) 32 MCG/ACT nasal spray [id number] Yes Place 1-2 sprays into both nostrils daily. Jamie Lopez Taking Active   buPROPion (WELLBUTRIN XL) 300 MG 24 hr tablet [id number] Yes Take 1 tablet (300 mg total) by mouth every morning. Jamie Lopez Taking Active   busPIRone (BUSPAR) 15 MG tablet [id number] Yes Take 1 tablet (15 mg total) by mouth daily. Jamie Lopez Taking Active   cetirizine [facility] ALLERGY) 10 MG tablet [id number] Yes Take 1 tablet (10 mg total) by mouth daily. Jamie Lopez Taking Active   diclofenac (VOLTAREN) 75 MG EC tablet [id number] Yes Take 75 mg by mouth 2 (two) times daily. Provider,  Historical, Lopez Taking Active Self           Med Note Jamie Lopez   Fri Jan 29, 2021  1:17 PM) PRN  etonogestrel-ethinyl estradiol (NUVARING) 0.12-0.015 MG/24HR vaginal ring [id number] Yes Place 1 each vaginally every 28 (twenty-eight) days. Insert vaginally and leave in place for 3 consecutive weeks, then remove for 1 week. Jamie Lopez Taking Active   hydroxychloroquine (PLAQUENIL) 200 MG tablet [id number] Yes Take 200 mg by mouth 2 (two) times daily. Jamie Lopez Taking Active   hydrOXYzine (VISTARIL) 25 MG capsule [id number] Yes TAKE 1 TO 2 CAPSULES BY MOUTH EVERY DAY AT BEDTIME AS NEEDED FOR INSOMNIA Jamie Lopez Taking Active   ibuprofen (ADVIL) 600 MG tablet [id number] Yes Take 1 tablet (600 mg total) by mouth every 6 (six) hours as needed. Jamie Lopez Taking Active   meloxicam (MOBIC) 15 MG tablet [id number] Yes Take 15 mg by mouth daily as needed for pain. Jamie Lopez Taking Active            Med Note Jamie Lopez   Fri Jan 29, 2021  1:18 PM) PRN  naproxen (NAPROSYN) 500 MG tablet [id number] Yes Take 1 tablet (500 mg total) by mouth 2 (two) times daily. Jamie Lopez Taking Active   Olopatadine HCl (PATADAY) 0.2 % SOLN [id number]  Apply 1 drop to eye daily as needed. Jamie Lopez  Active   ondansetron (ZOFRAN-ODT) 4 MG disintegrating tablet [id number] Yes Take 1 tablet (4 mg total) by mouth as needed. Jamie Lopez Taking Active   propranolol (INDERAL) 10 MG tablet [id number] Yes Take  1 tablet (10 mg total) by mouth 3 (three) times daily. Jamie Lopez Taking Active   pseudoephedrine (SUDAFED) 60 MG tablet [id number] Yes Take 1 tablet (60 mg total) by mouth every 8 (eight) hours as needed for congestion. Jamie Lopez Taking Active   rizatriptan (MAXALT-MLT) 10 MG disintegrating tablet [id number] Yes Take 1 tablet (10 mg total) by mouth as needed for migraine. May repeat in 2 hours if needed Jamie Lopez Taking Active   sertraline (ZOLOFT) 100 MG tablet [id number] Yes Take 2 tablets (200 mg total) by mouth daily. Jamie Lopez Taking Active   triamcinolone cream (KENALOG) 0.1 % [id number] Yes Apply 1 application topically 2 (two) times daily. Jamie Lopez Taking Active   valACYclovir (VALTREX) 1000 MG tablet [id number] Yes Take 2,000 mg by mouth 2 (two) times daily. Jamie Lopez Taking Active   Vitamin D, Ergocalciferol, (DRISDOL) 1.25 MG (50000 UNIT) CAPS capsule [id number] Yes Take 1 capsule (50,000 Units total) by mouth every 7 (seven) days. Jamie Lopez Taking Active             Home Care and Equipment/Supplies: Were Home Health Services Ordered?: NA Any new equipment or medical supplies ordered?: NA  Functional Questionnaire: Do you need assistance with bathing/showering or dressing?: No Do you need assistance with meal preparation?: No Do you need assistance with eating?: No Do you have difficulty maintaining continence: No Do you need assistance with getting out of bed/getting out of a chair/moving?: No Do you have difficulty managing or taking your medications?: No  Follow up appointments reviewed: PCP Follow-up appointment confirmed?: Yes Date of PCP follow-up appointment?: 10/06/22 Follow-up Provider: Rodman Lopez Specialist [facility] Dr Jamie Lopez Follow-up appointment confirmed?: NA Do you need transportation to your follow-up appointment?: No Do you understand care options if your condition(s) worsen?: Yes-patient verbalized understanding    SIGNATURE Jamie Lopez

## 2022-10-06 ENCOUNTER — Ambulatory Visit (INDEPENDENT_AMBULATORY_CARE_PROVIDER_SITE_OTHER): Payer: BC Managed Care – PPO | Admitting: Nurse Practitioner

## 2022-10-06 VITALS — BP 108/84 | HR 78 | Temp 97.5°F | Ht 62.0 in | Wt 198.2 lb

## 2022-10-06 DIAGNOSIS — R591 Generalized enlarged lymph nodes: Secondary | ICD-10-CM | POA: Insufficient documentation

## 2022-10-06 DIAGNOSIS — R079 Chest pain, unspecified: Secondary | ICD-10-CM | POA: Diagnosis not present

## 2022-10-06 NOTE — Assessment & Plan Note (Signed)
She has bilateral axillary, pectoral, mediastinal lymphadenopathy noted on CT angio.  Will check a CT of her abdomen and pelvis for complete imaging and to rule out further lymphadenopathy and other findings.  Her GYN ordered an ultrasound of her breasts.  Recent CBC and BMP reviewed from the ER on 10/04/2022.  She denies unexplained weight loss, fevers, new night sweats.  Will follow-up after imaging is complete.

## 2022-10-06 NOTE — Assessment & Plan Note (Deleted)
xyz

## 2022-10-06 NOTE — Progress Notes (Signed)
Established Patient Office Visit  Subjective   Patient ID: Jamie Lopez, female    DOB: Feb 26, 1996  Age: 27 y.o. MRN: [id number]  Chief Complaint  Patient presents with   Hospitalization Follow-up    Concerns with left chest pain, deep left axillary lymph nodes     Jamie Lopez is here to follow-up after emergency room visit on 10/04/22 for chest pain.   She woke up on Tuesday morning with chest pain. She thought it was gas and took some gingerale and changed positions. The pain didn't improve and got worse so she went to urgent care and then went to the ER. She kept feeling like it was getting worse and she was having a hard time breathing.  She states the pain had come on all of a sudden.  In the emergency room she was given a GI cocktail and Pepcid which has not helped the pain.  Her D-dimer was elevated and they did a CT scan of her chest which was negative for PE.  However it did show lymphadenopathy in her bilateral axilla, pectoral, and borderline mediastinal lymph nodes.  This could be reactive versus concern for malignancy.  She states that since she has been home her pain has improved.  She has been taking Percocet as needed for pain along with meloxicam.  She went to her GYN yesterday and had a breast exam done which was normal and is scheduled for an ultrasound of her breasts.  She denies fever, weight loss, cough.  She does have night sweats, however this has been going on since she was taking Plaquenil.  She was talking to her family about it and she learned some of her family has a history of hidradenitis suppurativa and has had swelling in their lymph nodes.  She was wondering if this may be happening with her, however she does not have any skin lesions.    ROS See pertinent positives and negatives per HPI.    Objective:     BP 108/84 (BP Location: Right Arm)   Pulse 78   Temp (!) 97.5 F (36.4 C)   Ht 5\' 2"  (1.575 m)   Wt 198 lb 3.2 oz (89.9 kg)   LMP  08/09/2022 Comment: patient on birthcontrol, shiedled patient-skipped period due to birthcontrol 10/04/2022; nuva ring  SpO2 97%   BMI 36.25 kg/m    Physical Exam Vitals and nursing note reviewed.  Constitutional:      General: She is not in acute distress.    Appearance: Normal appearance.  HENT:     Head: Normocephalic.  Eyes:     Conjunctiva/sclera: Conjunctivae normal.  Cardiovascular:     Rate and Rhythm: Normal rate and regular rhythm.     Pulses: Normal pulses.     Heart sounds: Normal heart sounds.  Pulmonary:     Effort: Pulmonary effort is normal.     Breath sounds: Normal breath sounds.  Musculoskeletal:        General: No swelling or tenderness.     Cervical back: Normal range of motion and neck supple. No tenderness.  Lymphadenopathy:     Cervical: Cervical adenopathy (right posterior cervical) present.  Skin:    General: Skin is warm.  Neurological:     General: No focal deficit present.     Mental Status: She is alert and oriented to person, place, and time.  Psychiatric:        Mood and Affect: Mood normal.  Behavior: Behavior normal.        Thought Content: Thought content normal.        Judgment: Judgment normal.      Assessment & Plan:   Problem List Items Addressed This Visit       Immune and Lymphatic   Lymphadenopathy    She has bilateral axillary, pectoral, mediastinal lymphadenopathy noted on CT angio.  Will check a CT of her abdomen and pelvis for complete imaging and to rule out further lymphadenopathy and other findings.  Her GYN ordered an ultrasound of her breasts.  Recent CBC and BMP reviewed from the ER on 10/04/2022.  She denies unexplained weight loss, fevers, new night sweats.  Will follow-up after imaging is complete.      Relevant Orders   CT Abdomen Pelvis W Contrast     Other   Chest pain - Primary    She had sudden chest pain on 10/04/2022.  She went to the ER and had negative troponins.  Her D-dimer was elevated, however  CT angio was negative.  She did have lymphadenopathy noted as noted above.  She does have slight tenderness noted to her chest wall during exam.  She was given a GI cocktail in the emergency room, however this did not help her pain, unlikely GERD.  She can continue meloxicam 15 mg daily as needed for the pain along with Percocet that she was discharged with.  Follow-up after imaging as noted above or sooner if symptoms worsen.       Return if symptoms worsen or fail to improve, for after imaging .    Gerre Scull, NP

## 2022-10-06 NOTE — Patient Instructions (Signed)
It was great to see you!  We are getting a CT of your abdomen and pelvis, they will call to schedule.   Let me know if your chest pain worsens again  Let's follow-up after your CT and ultrasound of your breast, sooner if you have concerns.  If a referral was placed today, you will be contacted for an appointment. Please note that routine referrals can sometimes take up to 3-4 weeks to process. Please call our office if you haven't heard anything after this time frame.  Take care,  Rodman Pickle, NP

## 2022-10-06 NOTE — Assessment & Plan Note (Signed)
She had sudden chest pain on 10/04/2022.  She went to the ER and had negative troponins.  Her D-dimer was elevated, however CT angio was negative.  She did have lymphadenopathy noted as noted above.  She does have slight tenderness noted to her chest wall during exam.  She was given a GI cocktail in the emergency room, however this did not help her pain, unlikely GERD.  She can continue meloxicam 15 mg daily as needed for the pain along with Percocet that she was discharged with.  Follow-up after imaging as noted above or sooner if symptoms worsen.

## 2022-10-07 DIAGNOSIS — F411 Generalized anxiety disorder: Secondary | ICD-10-CM | POA: Diagnosis not present

## 2022-10-11 DIAGNOSIS — N644 Mastodynia: Secondary | ICD-10-CM | POA: Diagnosis not present

## 2022-10-13 ENCOUNTER — Telehealth (HOSPITAL_COMMUNITY): Payer: Self-pay | Admitting: Psychiatry

## 2022-10-13 ENCOUNTER — Telehealth (HOSPITAL_COMMUNITY): Payer: BC Managed Care – PPO | Admitting: Psychiatry

## 2022-10-13 ENCOUNTER — Ambulatory Visit
Admission: RE | Admit: 2022-10-13 | Discharge: 2022-10-13 | Disposition: A | Discharge status: Home / Self Care | Payer: BC Managed Care – PPO | Source: Ambulatory Visit | Attending: Nurse Practitioner | Admitting: Nurse Practitioner

## 2022-10-13 DIAGNOSIS — F411 Generalized anxiety disorder: Secondary | ICD-10-CM | POA: Diagnosis not present

## 2022-10-13 DIAGNOSIS — R591 Generalized enlarged lymph nodes: Secondary | ICD-10-CM

## 2022-10-13 DIAGNOSIS — R590 Localized enlarged lymph nodes: Secondary | ICD-10-CM | POA: Diagnosis not present

## 2022-10-13 MED ORDER — IOPAMIDOL (ISOVUE-300) INJECTION 61%
100.0000 mL | Freq: Once | INTRAVENOUS | Status: AC | PRN
  Administered 2022-10-13: 100 mL via INTRAVENOUS

## 2022-10-13 NOTE — Telephone Encounter (Signed)
Patient was not present on video platform used through mychart. I called the patient at our scheduled appointment time. There was no answer. I left a voice message for patient to call the clinic back at their convinence. There was no return phone call during out scheduled visit time. I was not able to speak with the patient today, as they were a no show for their scheduled appointment.   

## 2022-10-15 IMAGING — MR MR ORBITS W/ CM
21 of 27 series · 34 of 48 positions shown · IV contrast (gadavist)
Comparison: None.

CLINICAL DATA: Optic neuritis suspected

EXAM:
MRI HEAD WITHOUT AND WITH CONTRAST
MRI ORBITS WITH CONTRAST
TECHNIQUE: Multiplanar, multiecho pulse sequences of the brain and surrounding
structures were obtained without and with intravenous contrast.
Multiplanar, multiecho pulse sequences of the orbits and surrounding
structures were obtained including fat saturation techniques, before
and after intravenous contrast administration.
CONTRAST:  8mL GADAVIST GADOBUTROL 1 MMOL/ML IV SOLN

[Series 5: ax dwi_tracew · axial · 3.0mm · 1.44mm/px · z∈[-84,+62]mm · 3 of 80 slices shown]
[im 1/80]
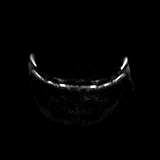
[im 40/80]
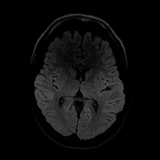
[im 80/80]
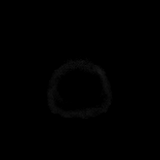

[Series 6: ax dwi_adc · axial · 3.0mm · 1.44mm/px · 1 of 40 slices shown]
[im 1/40]
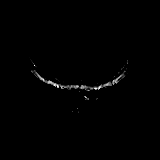

[Series 7: T1 · sagittal · 5.0mm · 0.72mm/px · 1 of 21 slices shown (1 of 3)]
[im 1/21]
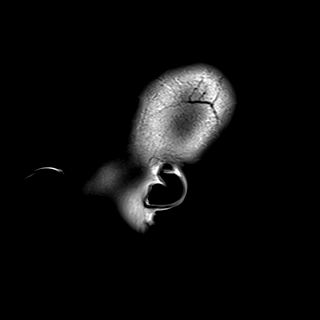

[Series 8: T2 · axial · 4.0mm · 0.72mm/px · z∈[-114,+35]mm · 2 of 32 slices shown]
[im 1/32]
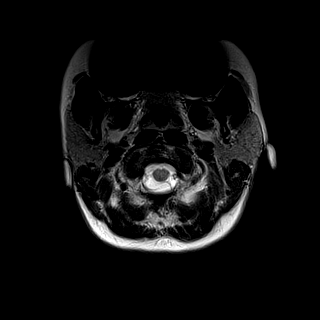
[im 32/32]
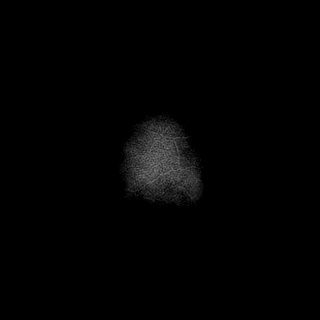

[Series 9: mag_images · axial · 3.0mm · 0.90mm/px · z∈[-131,+40]mm · 3 of 60 slices shown]
[im 1/60]
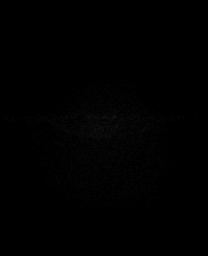
[im 30/60]
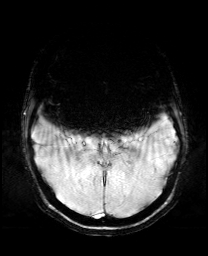
[im 60/60]
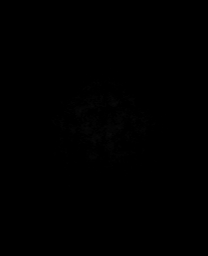

[Series 10: pha_images · axial · 3.0mm · 0.90mm/px · z∈[-119,+37]mm · 3 of 53 slices shown]
[im 1/53]
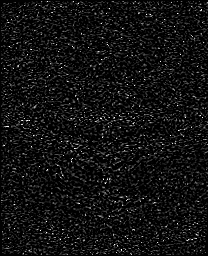
[im 27/53]
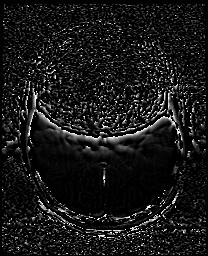
[im 53/53]
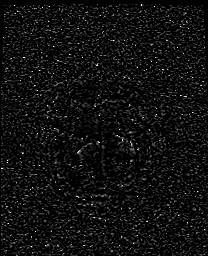

[Series 11: swi_images · axial · 3.0mm · 0.90mm/px · z∈[-131,+40]mm · 3 of 60 slices shown]
[im 1/60]
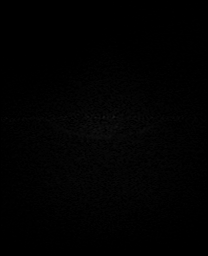
[im 30/60]
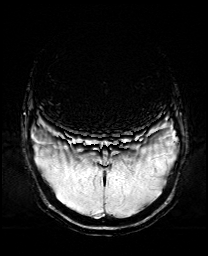
[im 60/60]
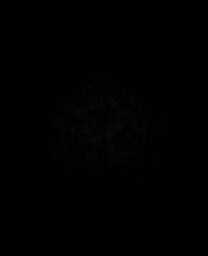

[Series 12: mip_images(sw) · axial · 24.0mm · 0.90mm/px · z∈[-121,+29]mm · 3 of 53 slices shown]
[im 1/53]
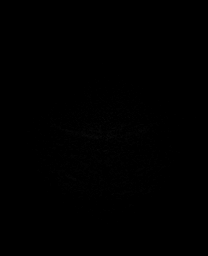
[im 27/53]
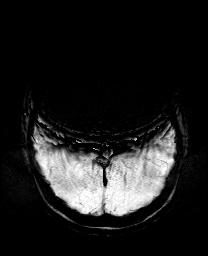
[im 53/53]
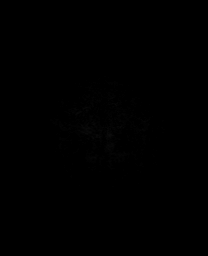

[Series 13: FLAIR · axial · 3.0mm · 0.45mm/px · z∈[-118,+30]mm · 2 of 42 slices shown]
[im 1/42]
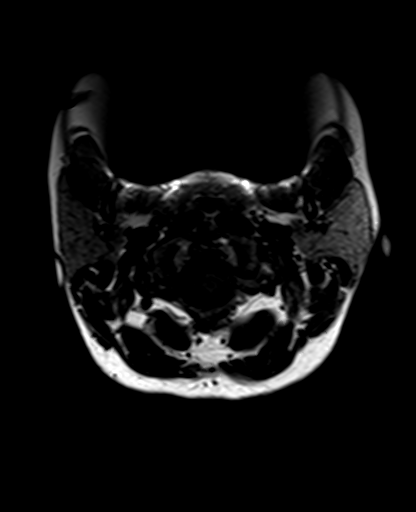
[im 42/42]
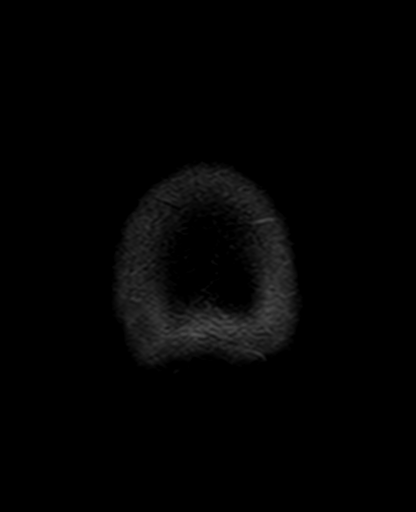

[Series 15: T1 · axial · non-contrast · 3.0mm · 0.37mm/px · 1 of 25 slices shown (2 of 3)]
[im 1/25]
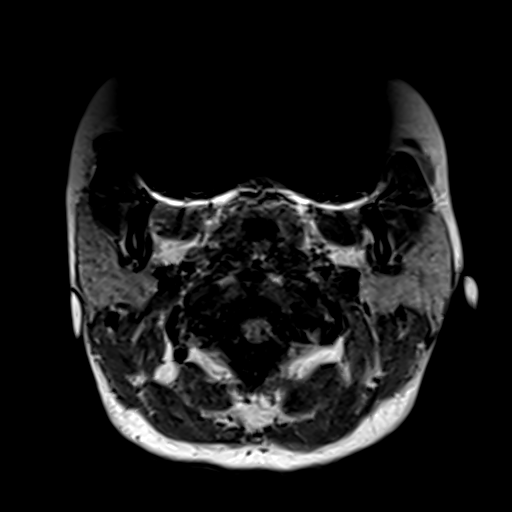

[Series 16: T2 fat-sat · axial · 3.0mm · 0.54mm/px · 1 of 25 slices shown (1 of 6)]
[im 1/25]
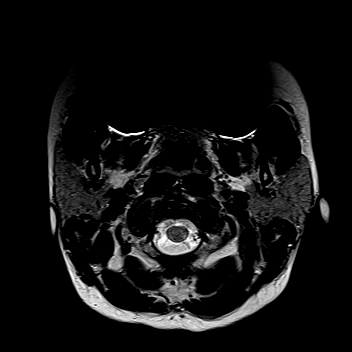

[Series 17: T2 fat-sat · axial · 3.0mm · 0.54mm/px · 1 of 25 slices shown (2 of 6)]
[im 1/25]
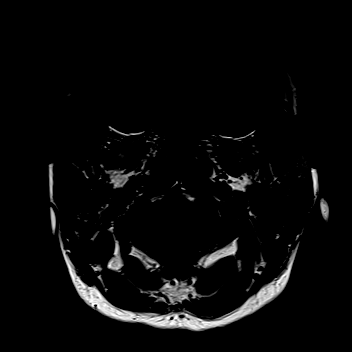

[Series 18: T2 fat-sat · axial · 3.0mm · 0.54mm/px · 1 of 25 slices shown (3 of 6)]
[im 1/25]
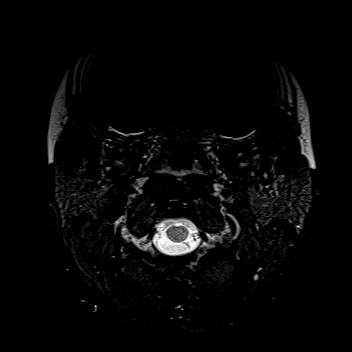

[Series 19: T2 fat-sat · coronal · 3.0mm · 0.54mm/px · 1 of 25 slices shown (4 of 6)]
[im 1/25]
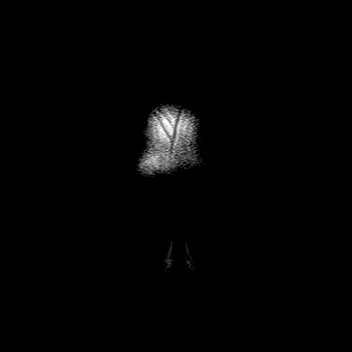

[Series 20: T2 fat-sat · coronal · 3.0mm · 0.54mm/px · 1 of 25 slices shown (5 of 6)]
[im 1/25]
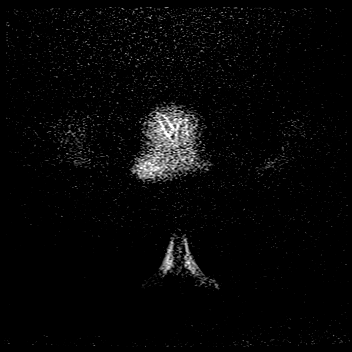

[Series 21: T2 fat-sat · coronal · 3.0mm · 0.54mm/px · 1 of 25 slices shown (6 of 6)]
[im 1/25]
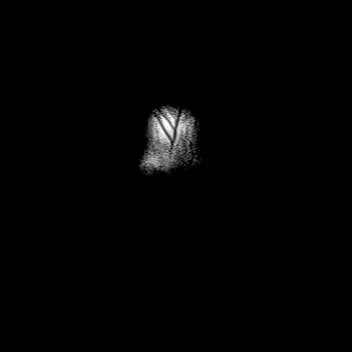

[Series 22: T1 · coronal · 3.0mm · 0.37mm/px · 1 of 25 slices shown (3 of 3)]
[im 1/25]
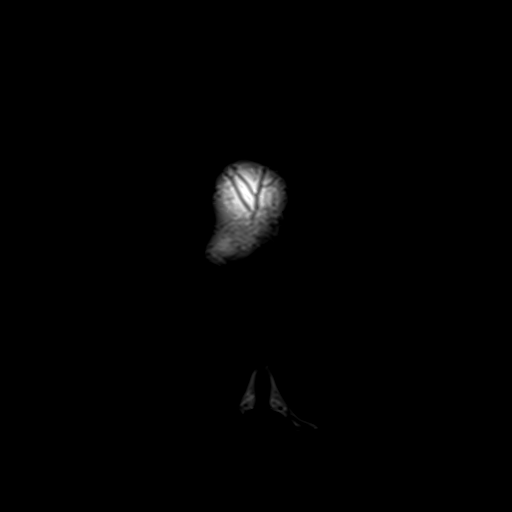

[Series 23: T2 post-contrast · coronal · 5.0mm · 0.72mm/px · 1 of 28 slices shown]
[im 1/28]
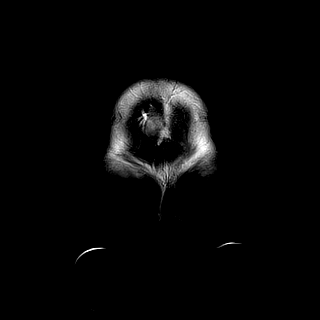

[Series 24: T1 fat-sat post-contrast · axial · 3.0mm · 0.37mm/px · 1 of 25 slices shown (1 of 2)]
[im 1/25]
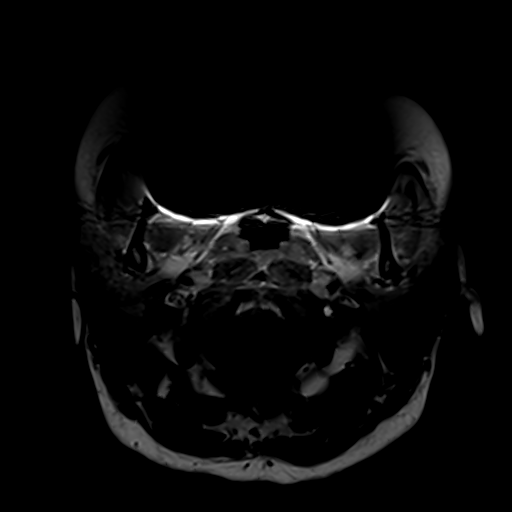

[Series 25: T1 fat-sat post-contrast · coronal · 3.0mm · 0.37mm/px · 1 of 25 slices shown (2 of 2)]
[im 1/25]
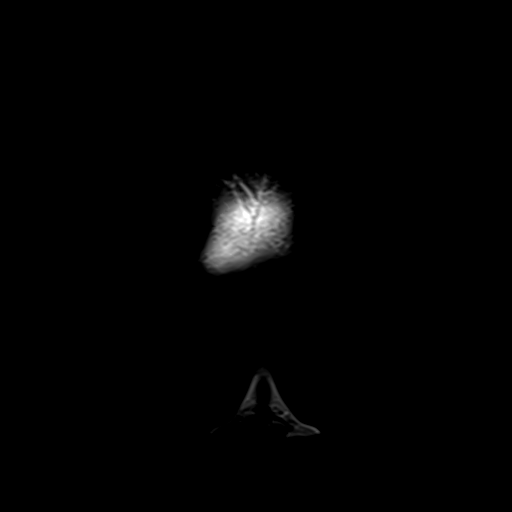

[Series 27: T1 post-contrast · coronal · 5.0mm · 0.34mm/px · 2 of 32 slices shown]
[im 1/32]
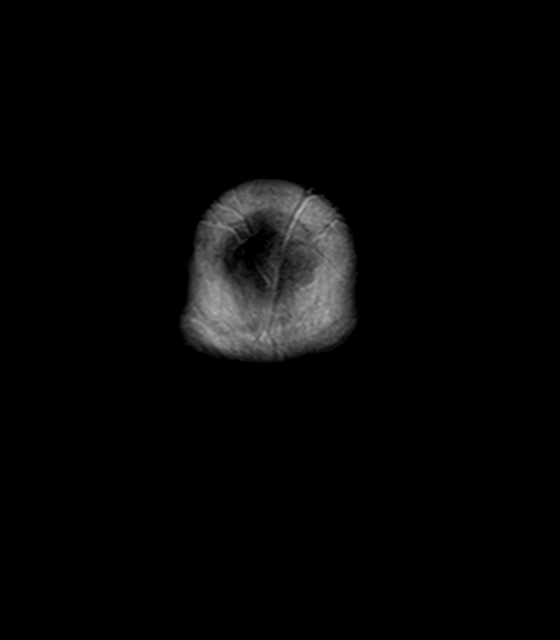
[im 32/32]
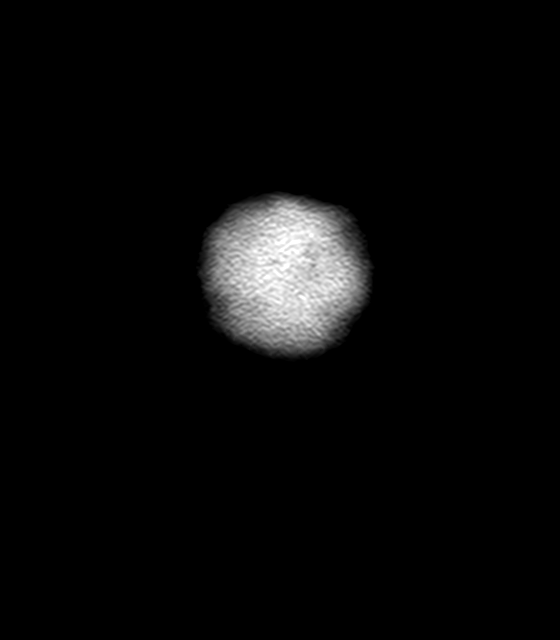

[34 of 48 positions shown; findings below may reference images not displayed]

FINDINGS: Evaluation is somewhat limited by susceptibility artifact from the
patient's dental hardware, which affects multiple sequences, and
obscures the orbits on several dedicated orbit sequences (for
example, into the in series 21).

MRI HEAD FINDINGS

Brain: Reference no restricted diffusion to suggest acute
infarction. No hemorrhage, edema, mass, mass effect, or midline
shift. No hydrocephalus or extra-axial collection. The ventricles
are within normal limits for age. No abnormal enhancement.

Vascular: Normal flow voids.

Skull and upper cervical spine: Normal marrow signal.

Other: The mastoids are well aerated.

MRI ORBITS FINDINGS

Orbits: No traumatic or inflammatory finding. Globes, optic nerves,
orbital fat, extraocular muscles, vascular structures, and lacrimal
glands are normal.

Visualized sinuses: Clear.

Soft tissues: Negative.
IMPRESSION: Evaluation is somewhat limited by susceptibility artifact from the
patient's dental hardware. Within this limitation, no acute
intracranial process or abnormality in the orbits.

## 2022-10-17 ENCOUNTER — Telehealth (HOSPITAL_COMMUNITY): Payer: Self-pay | Admitting: Psychiatry

## 2022-10-27 DIAGNOSIS — F411 Generalized anxiety disorder: Secondary | ICD-10-CM | POA: Diagnosis not present

## 2022-10-31 DIAGNOSIS — M1990 Unspecified osteoarthritis, unspecified site: Secondary | ICD-10-CM | POA: Diagnosis not present

## 2022-10-31 DIAGNOSIS — L659 Nonscarring hair loss, unspecified: Secondary | ICD-10-CM | POA: Diagnosis not present

## 2022-10-31 DIAGNOSIS — M329 Systemic lupus erythematosus, unspecified: Secondary | ICD-10-CM | POA: Diagnosis not present

## 2022-10-31 DIAGNOSIS — R0789 Other chest pain: Secondary | ICD-10-CM | POA: Diagnosis not present

## 2022-11-02 ENCOUNTER — Ambulatory Visit (INDEPENDENT_AMBULATORY_CARE_PROVIDER_SITE_OTHER): Payer: BC Managed Care – PPO | Admitting: Nurse Practitioner

## 2022-11-02 ENCOUNTER — Encounter: Payer: Self-pay | Admitting: Nurse Practitioner

## 2022-11-02 VITALS — BP 120/72 | HR 86 | Temp 97.8°F | Ht 62.0 in | Wt 197.0 lb

## 2022-11-02 DIAGNOSIS — R591 Generalized enlarged lymph nodes: Secondary | ICD-10-CM | POA: Diagnosis not present

## 2022-11-02 NOTE — Progress Notes (Signed)
   Established Patient Office Visit  Subjective   Patient ID: Jamie Lopez, female    DOB: 08-Sep-1995  Age: 27 y.o. MRN: [id number]  Chief Complaint  Patient presents with   Discuss Imaging Results    Scan done on 10/11/22 and 10/13/22    HPI  Ciin Edsall is here to follow-up on axillary, pectoral, and mediastinal lymphadenopathy.  She had the CT of her abdomen and pelvis which was negative.  She also had an ultrasound of her breasts which were negative.  The ultrasound of her breast did show ongoing axillary lymphadenopathy bilaterally.  She denies fevers, night sweats, unexplained weight loss.  She did meet with her rheumatologist on Monday and had some labs drawn.  Her CRP and ESR were elevated.  Her other labs are pending.    ROS See pertinent positives and negatives per HPI.    Objective:     BP 120/72 (BP Location: Left Arm)   Pulse 86   Temp 97.8 F (36.6 C)   Ht 5\' 2"  (1.575 m)   Wt 197 lb (89.4 kg)   LMP 08/09/2022 (Exact Date) Comment: patient on birthcontrol, shiedled patient-skipped period due to birthcontrol 10/04/2022; nuva ring  SpO2 99%   BMI 36.03 kg/m    Physical Exam Vitals and nursing note reviewed.  Constitutional:      General: She is not in acute distress.    Appearance: Normal appearance.  HENT:     Head: Normocephalic.  Eyes:     Conjunctiva/sclera: Conjunctivae normal.  Cardiovascular:     Rate and Rhythm: Normal rate and regular rhythm.     Pulses: Normal pulses.     Heart sounds: Normal heart sounds.  Pulmonary:     Effort: Pulmonary effort is normal.     Breath sounds: Normal breath sounds.  Musculoskeletal:     Cervical back: Normal range of motion.  Skin:    General: Skin is warm.  Neurological:     General: No focal deficit present.     Mental Status: She is alert and oriented to person, place, and time.  Psychiatric:        Mood and Affect: Mood normal.        Behavior: Behavior normal.        Thought Content: Thought  content normal.        Judgment: Judgment normal.      Assessment & Plan:   Problem List Items Addressed This Visit       Immune and Lymphatic   Lymphadenopathy - Primary    She has bilateral axillary, pectoral, mediastinal lymphadenopathy noted on CT angio.  CT of her abdomen and pelvis and breast ultrasound were negative.  ESR and CRP were elevated.  Her DsDNA is still pending, however this could be a lupus flare.  Will repeat CT of her chest in 3 months to follow-up on lymphadenopathy.  If it is still present we will place referral to heme/onc.        Return in about 6 months (around 05/04/2023) for CPE.    Gerre Scull, NP

## 2022-11-02 NOTE — Patient Instructions (Signed)
It was great to see you!  Keep in touch with your rheumatologist on the labs.   You will get a call to schedule the CT in 3 months to repeat for your chest.   Everything right now is looking like a lupus flare up.   Let's follow-up in 6 months, sooner if you have concerns.  If a referral was placed today, you will be contacted for an appointment. Please note that routine referrals can sometimes take up to 3-4 weeks to process. Please call our office if you haven't heard anything after this time frame.  Take care,  Rodman Pickle, NP

## 2022-11-02 NOTE — Assessment & Plan Note (Signed)
She has bilateral axillary, pectoral, mediastinal lymphadenopathy noted on CT angio.  CT of her abdomen and pelvis and breast ultrasound were negative.  ESR and CRP were elevated.  Her DsDNA is still pending, however this could be a lupus flare.  Will repeat CT of her chest in 3 months to follow-up on lymphadenopathy.  If it is still present we will place referral to heme/onc.

## 2022-11-10 ENCOUNTER — Ambulatory Visit: Payer: BC Managed Care – PPO | Admitting: Nurse Practitioner

## 2022-11-10 DIAGNOSIS — F411 Generalized anxiety disorder: Secondary | ICD-10-CM | POA: Diagnosis not present

## 2022-11-17 ENCOUNTER — Telehealth (HOSPITAL_BASED_OUTPATIENT_CLINIC_OR_DEPARTMENT_OTHER): Payer: BC Managed Care – PPO | Admitting: Psychiatry

## 2022-11-17 DIAGNOSIS — F331 Major depressive disorder, recurrent, moderate: Secondary | ICD-10-CM | POA: Diagnosis not present

## 2022-11-17 DIAGNOSIS — F99 Mental disorder, not otherwise specified: Secondary | ICD-10-CM | POA: Diagnosis not present

## 2022-11-17 DIAGNOSIS — F5105 Insomnia due to other mental disorder: Secondary | ICD-10-CM | POA: Diagnosis not present

## 2022-11-17 DIAGNOSIS — F411 Generalized anxiety disorder: Secondary | ICD-10-CM | POA: Diagnosis not present

## 2022-11-17 MED ORDER — HYDROXYZINE PAMOATE 25 MG PO CAPS: ORAL_CAPSULE | ORAL | 0 refills | Status: DC

## 2022-11-17 MED ORDER — PROPRANOLOL HCL 10 MG PO TABS: 10.0000 mg | ORAL_TABLET | Freq: Three times a day (TID) | ORAL | 0 refills | Status: DC

## 2022-11-17 MED ORDER — BUPROPION HCL ER (XL) 300 MG PO TB24
300.0000 mg | ORAL_TABLET | ORAL | 0 refills | Status: DC
Start: 2022-11-17 — End: 2023-02-21

## 2022-11-17 MED ORDER — BUPROPION HCL ER (XL) 150 MG PO TB24: 150.0000 mg | ORAL_TABLET | Freq: Every day | ORAL | 0 refills | Status: DC

## 2022-11-17 MED ORDER — SERTRALINE HCL 100 MG PO TABS
200.0000 mg | ORAL_TABLET | Freq: Every day | ORAL | 0 refills | Status: DC
Start: 2022-11-17 — End: 2023-02-21

## 2022-11-17 MED ORDER — BUSPIRONE HCL 15 MG PO TABS: 15.0000 mg | ORAL_TABLET | Freq: Every day | ORAL | 0 refills | Status: DC

## 2022-11-17 NOTE — Progress Notes (Signed)
Virtual Visit via Video Note  I connected with Jamie Lopez on 11/17/22 at  4:15 PM EDT by a video enabled telemedicine application and verified that I am speaking with the correct person using two identifiers.  Location: Patient: outside at work Provider: office   I discussed the limitations of evaluation and management by telemedicine and the availability of in person appointments. The patient expressed understanding and agreed to proceed.  History of Present Illness: Jamie Lopez has been busy working. She is still on her leave of absence from school. She plans to go back in January 2025. She is working with her therapist to get past her negative self talk and feels of inferiority. [name] is less anxious lately as long as she is not talking about school. Her depression seems to be increasing. This past father's day was hard. Being on school campus makes her sad. At least 1-2x/week she lies in bed all day on her off days. She is depressed 1-2 days and has low motivation with some anhedonia is 2-3 days/week. Most nights she is sleeping well and is tired from work. She is eating about 2 meals/day. Her focus is good. She denies SI/HI and passive thoughts of death. At times she wishes she could disappear for a while to get a break but denies wanting to die. Her boyfriend and family are very supportive. Her meds are effective and she denies SE.     Observations/Objective: Psychiatric Specialty Exam: ROS  Last menstrual period 08/09/2022.There is no height or weight on file to calculate BMI.  General Appearance: Fairly Groomed and Neat  Eye Contact:  Good  Speech:  Clear and Coherent and Normal Rate  Volume:  Normal  Mood:  Depressed  Affect:  Full Range  Thought Process:  Goal Directed, Linear, and Descriptions of Associations: Intact  Orientation:  Full (Time, Place, and Person)  Thought Content:  Logical  Suicidal Thoughts:  No  Homicidal Thoughts:  No  Memory:  Immediate;   Good  Judgement:   Good  Insight:  Good  Psychomotor Activity:  Normal  Concentration:  Concentration: Good  Recall:  Good  Fund of Knowledge:  Good  Language:  Good  Akathisia:  No  Handed:  Right  AIMS (if indicated):     Assets:  Communication Skills Desire for Improvement Financial Resources/Insurance Housing Intimacy Leisure Time Resilience Social Support Talents/Skills Transportation Vocational/Educational  ADL's:  Intact  Cognition:  WNL  Sleep:        Assessment and Plan:     11/17/2022    4:22 PM 09/09/2022   11:48 AM 08/11/2022    3:18 PM 06/09/2022    3:38 PM 05/19/2022    1:22 PM  Depression screen PHQ 2/9  Decreased Interest 1 2 1  0 1  Down, Depressed, Hopeless 1 2 1 1 1   PHQ - 2 Score 2 4 2 1 2   Altered sleeping 1 3 1 1 3   Tired, decreased energy 3 1 0 1 2  Change in appetite 0 2 3 0 3  Feeling bad or failure about yourself  3 2 2 2 3   Trouble concentrating 0 2 1 1 3   Moving slowly or fidgety/restless 0 0 0 0 0  Suicidal thoughts 0 0 0 0 0  PHQ-9 Score 9 14 9 6 16   Difficult doing work/chores Very difficult Very difficult Very difficult Somewhat difficult Somewhat difficult    Flowsheet Row Video Visit from 11/17/2022 in [facility] from 10/04/2022 in  [facility] Video Visit from 08[facility]11/2022 in [facility]  C-SSRS RISK CATEGORY No Risk No Risk No Risk          Pt is aware that these meds carry a teratogenic risk. Pt will discuss plan of action if she does or plans to become pregnant in the future.  Status of current problems: worsening depression symptoms   Medication management with supportive therapy. Risks and benefits, side effects and alternative treatment options discussed with patient. Pt was given an opportunity to ask questions about medication, illness, and treatment. All current psychiatric medications have been reviewed and  discussed with the patient and adjusted as clinically appropriate.  Pt verbalized understanding and verbal consent obtained for treatment.  Meds: increase Wellbutrin XL 150mg  po every day She is not sure but thinks she may have been taking Buspar 7.5mg  every day. I have increased it back to 15mg  po qD 1. MDD (major depressive disorder), recurrent episode, moderate (HCC) - buPROPion (WELLBUTRIN XL) 300 MG 24 hr tablet; Take 1 tablet (300 mg total) by mouth every morning.  Dispense: 90 tablet; Refill: 0 - busPIRone (BUSPAR) 15 MG tablet; Take 1 tablet (15 mg total) by mouth daily.  Dispense: 90 tablet; Refill: 0 - sertraline (ZOLOFT) 100 MG tablet; Take 2 tablets (200 mg total) by mouth daily.  Dispense: 180 tablet; Refill: 0  2. GAD (generalized anxiety disorder) - busPIRone (BUSPAR) 15 MG tablet; Take 1 tablet (15 mg total) by mouth daily.  Dispense: 90 tablet; Refill: 0 - hydrOXYzine (VISTARIL) 25 MG capsule; TAKE 1 TO 2 CAPSULES BY MOUTH EVERY DAY AT BEDTIME AS NEEDED FOR INSOMNIA  Dispense: 180 capsule; Refill: 0 - propranolol (INDERAL) 10 MG tablet; Take 1 tablet (10 mg total) by mouth 3 (three) times daily.  Dispense: 270 tablet; Refill: 0 - sertraline (ZOLOFT) 100 MG tablet; Take 2 tablets (200 mg total) by mouth daily.  Dispense: 180 tablet; Refill: 0  3. Insomnia due to other mental disorder - busPIRone (BUSPAR) 15 MG tablet; Take 1 tablet (15 mg total) by mouth daily.  Dispense: 90 tablet; Refill: 0 - hydrOXYzine (VISTARIL) 25 MG capsule; TAKE 1 TO 2 CAPSULES BY MOUTH EVERY DAY AT BEDTIME AS NEEDED FOR INSOMNIA  Dispense: 180 capsule; Refill: 0     Labs: none    Therapy: brief supportive therapy provided. Discussed psychosocial stressors in detail.      Collaboration of Care: Other encouraged to continue therapy  Patient/Guardian was advised Release of Information must be obtained prior to any record release in order to collaborate their care with an outside provider.  Patient/Guardian was advised if they have not already done so to contact the registration department to sign all necessary forms in order for [place] to release information regarding their care.   Consent: Patient/Guardian gives verbal consent for treatment and assignment of benefits for services provided during this visit. Patient/Guardian expressed understanding and agreed to proceed.     Follow Up Instructions: Follow up in 2-3 months or sooner if needed with a new psychiatrist  Patient informed that I am leaving [facility] in 11/2022 and I relayed that they will be getting a new provider after that. Patient verbalized understanding and agreed with the plan.     I discussed the assessment and treatment plan with the patient. The patient was provided an opportunity to ask questions and all were answered. The patient agreed with the plan and demonstrated an understanding of the  instructions.   The patient was advised to call back or seek an in-person evaluation if the symptoms worsen or if the condition fails to improve as anticipated.  I provided 18 minutes of non-face-to-face time during this encounter.  Oletta Darter, MD
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
## 2022-11-30 ENCOUNTER — Ambulatory Visit (INDEPENDENT_AMBULATORY_CARE_PROVIDER_SITE_OTHER): Payer: Self-pay | Admitting: Internal Medicine

## 2022-11-30 ENCOUNTER — Other Ambulatory Visit (HOSPITAL_COMMUNITY)
Admission: RE | Admit: 2022-11-30 | Discharge: 2022-11-30 | Disposition: A | Discharge status: Home / Self Care | Payer: Self-pay | Source: Ambulatory Visit | Attending: Internal Medicine | Admitting: Internal Medicine

## 2022-11-30 ENCOUNTER — Encounter: Payer: Self-pay | Admitting: Internal Medicine

## 2022-11-30 VITALS — BP 118/86 | HR 84 | Temp 98.9°F | Ht 62.0 in | Wt 193.6 lb

## 2022-11-30 DIAGNOSIS — Z202 Contact with and (suspected) exposure to infections with a predominantly sexual mode of transmission: Secondary | ICD-10-CM | POA: Insufficient documentation

## 2022-11-30 DIAGNOSIS — R102 Pelvic and perineal pain: Secondary | ICD-10-CM

## 2022-11-30 DIAGNOSIS — N920 Excessive and frequent menstruation with regular cycle: Secondary | ICD-10-CM

## 2022-11-30 LAB — POC URINALSYSI DIPSTICK (AUTOMATED)
Bilirubin, UA: NEGATIVE
Blood, UA: NEGATIVE
Glucose, UA: NEGATIVE
Ketones, UA: NEGATIVE
Leukocytes, UA: NEGATIVE
Nitrite, UA: NEGATIVE
Protein, UA: NEGATIVE
Spec Grav, UA: 1.015 (ref 1.010–1.025)
Urobilinogen, UA: 0.2 E.U./dL
pH, UA: 6 (ref 5.0–8.0)

## 2022-11-30 LAB — POCT PREGNANCY, URINE

## 2022-11-30 NOTE — Patient Instructions (Signed)
I will contact you about your test results

## 2022-12-02 LAB — CERVICOVAGINAL ANCILLARY ONLY
Bacterial Vaginitis (gardnerella): NEGATIVE
Candida Glabrata: NEGATIVE
Candida Vaginitis: NEGATIVE
Chlamydia: NEGATIVE
Comment: NEGATIVE
Comment: NEGATIVE
Comment: NEGATIVE
Comment: NEGATIVE
Comment: NEGATIVE
Comment: NORMAL
Neisseria Gonorrhea: NEGATIVE
Trichomonas: NEGATIVE

## 2022-12-02 LAB — HIV ANTIBODY (ROUTINE TESTING W REFLEX): HIV 1&2 Ab, 4th Generation: NONREACTIVE

## 2022-12-02 LAB — HSV(HERPES SIMPLEX VRS) I + II AB-IGG
HAV 1 IGG,TYPE SPECIFIC AB: 47.5 index — ABNORMAL HIGH
HSV 2 IGG,TYPE SPECIFIC AB: <0.9 index

## 2022-12-02 LAB — RPR: RPR Ser Ql: NONREACTIVE

## 2022-12-16 ENCOUNTER — Ambulatory Visit: Payer: BC Managed Care – PPO | Admitting: Nurse Practitioner

## 2023-01-02 ENCOUNTER — Other Ambulatory Visit: Payer: Self-pay | Admitting: Nurse Practitioner

## 2023-01-02 DIAGNOSIS — R591 Generalized enlarged lymph nodes: Secondary | ICD-10-CM

## 2023-02-20 ENCOUNTER — Ambulatory Visit (HOSPITAL_BASED_OUTPATIENT_CLINIC_OR_DEPARTMENT_OTHER): Payer: Commercial Managed Care - HMO | Admitting: Student

## 2023-02-20 VITALS — BP 126/86 | Ht 62.0 in | Wt 197.0 lb

## 2023-02-20 DIAGNOSIS — F99 Mental disorder, not otherwise specified: Secondary | ICD-10-CM | POA: Diagnosis not present

## 2023-02-20 DIAGNOSIS — F411 Generalized anxiety disorder: Secondary | ICD-10-CM

## 2023-02-20 DIAGNOSIS — F5105 Insomnia due to other mental disorder: Secondary | ICD-10-CM

## 2023-02-20 DIAGNOSIS — F331 Major depressive disorder, recurrent, moderate: Secondary | ICD-10-CM | POA: Diagnosis not present

## 2023-02-21 ENCOUNTER — Other Ambulatory Visit (HOSPITAL_COMMUNITY): Payer: Self-pay | Admitting: Psychiatry

## 2023-02-21 DIAGNOSIS — F331 Major depressive disorder, recurrent, moderate: Secondary | ICD-10-CM | POA: Insufficient documentation

## 2023-02-21 DIAGNOSIS — F99 Mental disorder, not otherwise specified: Secondary | ICD-10-CM | POA: Insufficient documentation

## 2023-02-21 MED ORDER — SERTRALINE HCL 100 MG PO TABS: 200.0000 mg | ORAL_TABLET | Freq: Every day | ORAL | 2 refills | Status: DC

## 2023-02-21 MED ORDER — BUPROPION HCL ER (XL) 300 MG PO TB24
300.0000 mg | ORAL_TABLET | ORAL | 2 refills | Status: DC
Start: 2023-02-21 — End: 2023-04-05

## 2023-02-21 MED ORDER — PROPRANOLOL HCL 10 MG PO TABS: 10.0000 mg | ORAL_TABLET | Freq: Three times a day (TID) | ORAL | 2 refills | Status: DC

## 2023-02-21 MED ORDER — BUPROPION HCL ER (XL) 150 MG PO TB24: 150.0000 mg | ORAL_TABLET | Freq: Every day | ORAL | 2 refills | Status: DC

## 2023-02-21 MED ORDER — HYDROXYZINE PAMOATE 25 MG PO CAPS: ORAL_CAPSULE | ORAL | 2 refills | Status: DC

## 2023-02-21 MED ORDER — BUSPIRONE HCL 15 MG PO TABS
15.0000 mg | ORAL_TABLET | Freq: Every day | ORAL | 2 refills | Status: DC
Start: 2023-02-21 — End: 2023-04-05

## 2023-02-21 NOTE — Progress Notes (Signed)
BH MD Outpatient Progress Note  02/21/2023 8:04 PM Jamie Lopez  MRN:  [id number]  Assessment:  Jamie Lopez presents for follow-up evaluation. They are a long time patient of Dr. Michae Kava. This provider has left the clinic and I will be the patient's psychiatric provider for the foreseeable future.  Previous diagnosis was major depressive disorder moderate.  I agree with this diagnosis.  The patient reports a somewhat worsened mood and anxiety since her last visit in June, which she partially attributes to being off of her medication.  She states that her insurance lapsed and she was unable to afford the medication.  She states that she has insurance now and will have it for the foreseeable future.  We discussed restarting her previously effective psychiatric regimen as described below.  I recommended that she take a half dose of the medication for 3 days and then commence her previous dose of each medication.  The patient expressed her understanding.   Identifying Information: Jamie Lopez is a 27 y.o. y.o. female with a history of major depressive disorder moderate who is an established patient with [facility] for management of depression and anxiety.   Plan:  # Major depressive disorder moderate  GAD Interventions: -- Restart Wellbutrin 450 mg daily - Restart Zoloft 200 mg daily - Restart buspirone 15 mg daily - Restart hydroxyzine 25 mg nightly as needed - Restart propranolol 10 mg 3 times daily - As above, I recommended the patient's start with half doses of the medication for 3 days and then resume her previous doses.  # Systemic lupus erythematosus Interventions: -- Continue Plaquenil - Continue to follow with rheumatologist   Patient was given contact information for behavioral health clinic and was instructed to call 911 for emergencies.   Subjective:  Chief Complaint:  Chief Complaint  Patient presents with   Follow-up    Interval  History:  Because this is my first time meeting the patient, pertinent social history is obtained.  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  She states that she is still taking exams throughout the fall.  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  She reports that she lives with her mother who is very supportive.  She reports that her father suffered from mental health issues and died a few years ago via overdose.  The patient reports smoking marijuana and having several drinks approximately once per week.  She states that she intends to stop smoking marijuana shortly before resuming PA school.  Today the patient reports a mood that is "normal".  She feels that she sleeps excessively due to her lupus.  She describes an appetite that is regular.  She reports existential difficulties related to having to move back home with her mother in August.  This has caused the patient cognitive distress.  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  Bipolar affective disorder screening is negative.  The patient reports experiencing occasional hopelessness and says that she occasionally experiences passive suicidal thoughts without ever having a plan or intention of harming herself.  She says "I am terrified of death and I would never do anything to hurt myself".  She states that these thoughts are chronic in nature.    Visit Diagnosis:    ICD-10-CM   1. GAD (generalized anxiety disorder)  F41.1     2. MDD (  major depressive disorder), recurrent episode, moderate (HCC)  F33.1       Past Psychiatric History:  The patient denies any history of suicide attempts and there are no admissions for psychiatric reasons documented in the medical record.  Past Medical History:  Past Medical History:  Diagnosis Date   Allergy 04/1999   Seasonal allergies    Anxiety    Asthma    Depression 01/2013   GERD (gastroesophageal reflux disease) 07/2020   Headache(784.0)    Lupus (HCC)    Rosacea    Seasonal allergies     Past Surgical History:  Procedure Laterality Date   WISDOM TOOTH EXTRACTION      Family Psychiatric History: None pertinent  Family History:  Family History  Problem Relation Age of Onset   Anxiety disorder Mother    Diabetes Mellitus II Mother    Depression Mother    Cancer Mother        ovarian   Diabetes Mother    Miscarriages / [place] Mother    Drug abuse Father    Asthma Father    Early death Father    Anxiety disorder Maternal Grandmother    Diabetes Mellitus II Maternal Grandmother    Depression Maternal Grandmother    Hypertension Maternal Grandmother    Anxiety disorder Maternal Grandfather    Depression Maternal Grandfather    Alcohol abuse Maternal Grandfather    Drug abuse Maternal Grandfather    Arthritis Maternal Grandfather    Alcohol abuse Paternal Grandfather    Drug abuse Paternal Grandfather    Bipolar disorder Cousin    Schizophrenia Cousin     Social History:  Social History   Socioeconomic History   Marital status: Single    Spouse name: Not on file   Number of children: 0   Years of education: 14   Highest education level: Master's degree (e.g., MA, MS, MEng, MEd, MSW, MBA)  Occupational History   Occupation: student    Comment: [employer]  Tobacco Use   Smoking status: Former    Current packs/day: 0.00    Types: Cigarettes, Cigars    Quit date: 2022    Years since quitting: 2.7   Smokeless tobacco: Never   Tobacco comments:    once a year or less when very stressed.  Vaping Use   Vaping status: Never Used  Substance and Sexual Activity   Alcohol use: Yes    Alcohol/week: 2.0 standard drinks of alcohol    Types: 2 Glasses of wine per week    Comment: social: 1-2x/week   Drug use: Not Currently    Types: Marijuana    Comment: quit in 2018   Sexual activity: Yes     Partners: Male    Birth control/protection: Condom, Other-see comments    Comment: NuvaRing  Other Topics Concern   Not on file  Social History Narrative   Not on file   Social Determinants of Health   Financial Resource Strain: Medium Risk (10/06/2022)   Overall Financial Resource Strain (CARDIA)    Difficulty of Paying Living Expenses: Somewhat hard  Food Insecurity: No Food Insecurity (10/06/2022)   Hunger Vital Sign    Worried About Running Out of Food in the Last Year: Never true    Ran Out of Food in the Last Year: Never true  Transportation Needs: No Transportation Needs (10/06/2022)   PRAPARE - Administrator, Civil Service (Medical): No    Lack of Transportation (Non-Medical): No  Physical  Activity: Insufficiently Active (10/06/2022)   Exercise Vital Sign    Days of Exercise per Week: 2 days    Minutes of Exercise per Session: 40 min  Stress: Stress Concern Present (10/06/2022)   [employer] of Occupational Health - Occupational Stress Questionnaire    Feeling of Stress : To some extent  Social Connections: Moderately Integrated (10/06/2022)   Social Connection and Isolation Panel [NHANES]    Frequency of Communication with Friends and Family: More than three times a week    Frequency of Social Gatherings with Friends and Family: More than three times a week    Attends Religious Services: 1 to 4 times per year    Active Member of [employer] or Organizations: Yes    Attends Banker Meetings: More than 4 times per year    Marital Status: Never married    Allergies:  Allergies  Allergen Reactions   Peanut-Containing Drug Products     Tree nut specifically    Other     ALL FRUIT    Current Medications: Current Outpatient Medications  Medication Sig Dispense Refill   albuterol (VENTOLIN HFA) 108 (90 Base) MCG/ACT inhaler Inhale 2 puffs into the lungs every 6 (six) hours as needed for wheezing or shortness of breath. 8 g 6   budesonide (RHINOCORT  ALLERGY) 32 MCG/ACT nasal spray Place 1-2 sprays into both nostrils daily. 8.6 g 6   buPROPion (WELLBUTRIN XL) 150 MG 24 hr tablet Take 1 tablet (150 mg total) by mouth daily. 90 tablet 0   buPROPion (WELLBUTRIN XL) 300 MG 24 hr tablet Take 1 tablet (300 mg total) by mouth every morning. 90 tablet 0   busPIRone (BUSPAR) 15 MG tablet Take 1 tablet (15 mg total) by mouth daily. 90 tablet 0   cetirizine (ZYRTEC ALLERGY) 10 MG tablet Take 1 tablet (10 mg total) by mouth daily. 30 tablet 0   diclofenac (VOLTAREN) 75 MG EC tablet Take 75 mg by mouth 2 (two) times daily.     etonogestrel-ethinyl estradiol (NUVARING) 0.12-0.015 MG/24HR vaginal ring Place 1 each vaginally every 28 (twenty-eight) days. Insert vaginally and leave in place for 3 consecutive weeks, then remove for 1 week.     hydroxychloroquine (PLAQUENIL) 200 MG tablet Take 200 mg by mouth 2 (two) times daily.     hydrOXYzine (VISTARIL) 25 MG capsule TAKE 1 TO 2 CAPSULES BY MOUTH EVERY DAY AT BEDTIME AS NEEDED FOR INSOMNIA 180 capsule 0   ibuprofen (ADVIL) 600 MG tablet Take 1 tablet (600 mg total) by mouth every 6 (six) hours as needed. 30 tablet 0   meloxicam (MOBIC) 15 MG tablet Take 15 mg by mouth daily as needed for pain.     naproxen (NAPROSYN) 500 MG tablet Take 1 tablet (500 mg total) by mouth 2 (two) times daily. 30 tablet 0   Olopatadine HCl (PATADAY) 0.2 % SOLN Apply 1 drop to eye daily as needed. 2.5 mL 3   ondansetron (ZOFRAN-ODT) 4 MG disintegrating tablet Take 1 tablet (4 mg total) by mouth as needed. 30 tablet 1   propranolol (INDERAL) 10 MG tablet Take 1 tablet (10 mg total) by mouth 3 (three) times daily. 270 tablet 0   pseudoephedrine (SUDAFED) 60 MG tablet Take 1 tablet (60 mg total) by mouth every 8 (eight) hours as needed for congestion. 30 tablet 0   rizatriptan (MAXALT-MLT) 10 MG disintegrating tablet Take 1 tablet (10 mg total) by mouth as needed for migraine. May repeat in 2 hours if  needed 90 tablet 1   sertraline  (ZOLOFT) 100 MG tablet Take 2 tablets (200 mg total) by mouth daily. 180 tablet 0   triamcinolone cream (KENALOG) 0.1 % Apply 1 application topically 2 (two) times daily. 30 g 0   valACYclovir (VALTREX) 1000 MG tablet Take 2,000 mg by mouth 2 (two) times daily.     Vitamin D, Ergocalciferol, (DRISDOL) 1.25 MG (50000 UNIT) CAPS capsule Take 1 capsule (50,000 Units total) by mouth every 7 (seven) days. 12 capsule 0   No current facility-administered medications for this visit.     Objective:  Psychiatric Specialty Exam: Physical Exam Constitutional:      Appearance: the patient is not toxic-appearing.  Pulmonary:     Effort: Pulmonary effort is normal.  Neurological:     General: No focal deficit present.     Mental Status: the patient is alert and oriented to person, place, and time.   Review of Systems  Respiratory:  Negative for shortness of breath.   Cardiovascular:  Negative for chest pain.  Gastrointestinal:  Negative for abdominal pain, constipation, diarrhea, nausea and vomiting.  Neurological:  Negative for headaches.      BP 126/86   Ht 5\' 2"  (1.575 m)   Wt 197 lb (89.4 kg)   BMI 36.03 kg/m   General Appearance: Fairly Groomed  Eye Contact:  Good  Speech:  Clear and Coherent  Volume:  Normal  Mood:  Euthymic  Affect:  Congruent  Thought Process:  Coherent  Orientation:  Full (Time, Place, and Person)  Thought Content: Logical   Suicidal Thoughts: Occasional thoughts of not wanting to be alive, as described above  Homicidal Thoughts:  No  Memory:  Immediate;   Good  Judgement:  fair  Insight:  fair  Psychomotor Activity:  Normal  Concentration:  Concentration: Good  Recall:  Good  Fund of Knowledge: Good  Language: Good  Akathisia:  No  Handed:    AIMS (if indicated): not done  Assets:  Communication Skills Desire for Improvement Financial Resources/Insurance Housing Leisure Time Physical Health  ADL's:  Intact  Cognition: WNL  Sleep:  Fair      Metabolic Disorder Labs: Lab Results  Component Value Date   HGBA1C 5.9 08/02/2022   No results found for: "PROLACTIN" Lab Results  Component Value Date   CHOL 157 12/23/2019   TRIG 70 12/23/2019   HDL 48 12/23/2019   CHOLHDL 3.3 12/23/2019   LDLCALC 95 12/23/2019   LDLCALC 83 01/02/2019   Lab Results  Component Value Date   TSH 1.31 04/13/2022   TSH 1.17 07/13/2021    Therapeutic Level Labs: No results found for: "LITHIUM" No results found for: "VALPROATE" No results found for: "CBMZ"  Screenings: AUDIT    Flowsheet Row Office Visit from 10/06/2022 in [facility] [employer]  Alcohol Use Disorder Identification Test Final Score (AUDIT) 9      GAD-7    Flowsheet Row Office Visit from 09/09/2022 in [facility] [employer] Visit from 04/29/2022 in [facility] [employer] Visit from 01/28/2022 in [facility] [employer]  Total GAD-7 Score 12 4 6       PHQ2-9    Flowsheet Row Office Visit from 11/30/2022 in [facility] Video Visit from 11/17/2022 in [facility] Office Visit from 09/09/2022 in [facility] Video Visit from 08/11/2022  [facility] Video Visit from 06/09/2022 [facility]  PHQ-2 Total Score 3 2 4 2 1   PHQ-9 Total Score 9 9 14 9 6       Flowsheet Row Video Visit [facility] 11/17/2022 [facility] ED from 10/04/2022 in [facility] at [facility] Video Visit from 08/11/2022 [facility]  C-SSRS RISK CATEGORY No Risk No Risk No Risk       Collaboration of Care: none  A total of 30 minutes was spent involved in face to face clinical  care, chart review, documentation.   Carlyn Reichert, MD 02/21/2023, 8:04 PM

## 2023-04-05 ENCOUNTER — Telehealth (HOSPITAL_BASED_OUTPATIENT_CLINIC_OR_DEPARTMENT_OTHER): Payer: 59 | Admitting: Student

## 2023-04-05 DIAGNOSIS — F411 Generalized anxiety disorder: Secondary | ICD-10-CM | POA: Diagnosis not present

## 2023-04-05 DIAGNOSIS — F5105 Insomnia due to other mental disorder: Secondary | ICD-10-CM

## 2023-04-05 DIAGNOSIS — F99 Mental disorder, not otherwise specified: Secondary | ICD-10-CM | POA: Diagnosis not present

## 2023-04-05 DIAGNOSIS — F331 Major depressive disorder, recurrent, moderate: Secondary | ICD-10-CM

## 2023-04-05 MED ORDER — HYDROXYZINE PAMOATE 25 MG PO CAPS: ORAL_CAPSULE | ORAL | 2 refills | Status: DC

## 2023-04-05 MED ORDER — BUPROPION HCL ER (XL) 300 MG PO TB24
300.0000 mg | ORAL_TABLET | ORAL | 2 refills | Status: DC
Start: 2023-04-05 — End: 2024-02-12

## 2023-04-05 MED ORDER — SERTRALINE HCL 100 MG PO TABS: 200.0000 mg | ORAL_TABLET | Freq: Every day | ORAL | 2 refills | Status: DC

## 2023-04-05 MED ORDER — BUSPIRONE HCL 15 MG PO TABS
15.0000 mg | ORAL_TABLET | Freq: Every day | ORAL | 2 refills | Status: AC
Start: 2023-04-05 — End: ?

## 2023-04-05 MED ORDER — PROPRANOLOL HCL 10 MG PO TABS
10.0000 mg | ORAL_TABLET | Freq: Three times a day (TID) | ORAL | 2 refills | Status: DC
Start: 2023-04-05 — End: 2023-06-13

## 2023-04-05 MED ORDER — BUPROPION HCL ER (XL) 150 MG PO TB24
150.0000 mg | ORAL_TABLET | Freq: Every day | ORAL | 2 refills | Status: DC
Start: 2023-04-05 — End: 2023-12-13

## 2023-04-06 NOTE — Addendum Note (Signed)
Addended by: Everlena Cooper on: 04/06/2023 02:27 PM   Modules accepted: Level of Service

## 2023-04-06 NOTE — Progress Notes (Signed)
BH MD Outpatient Progress Note  04/06/2023 9:11 AM Fallon Haecker  MRN:  [id number]  Televisit via video: I connected with Jamie Lopez on 04/05/2023 at  8:30 AM EST by a video enabled telemedicine application and verified that I am speaking with the correct person using two identifiers.  Location: Patient: home Provider: office   I discussed the limitations of evaluation and management by telemedicine and the availability of in person appointments. The patient expressed understanding and agreed to proceed.  I discussed the assessment and treatment plan with the patient. The patient was provided an opportunity to ask questions and all were answered. The patient agreed with the plan and demonstrated an understanding of the instructions.   The patient was advised to call back or seek an in-person evaluation if the symptoms worsen or if the condition fails to improve as anticipated.  Assessment:  Jamie Lopez presents for follow-up evaluation.  At the last visit the patient was off her medications as a result of difficulties with finances and insurance coverage.  The patient shared today that she was able to restart her medications and she feels she has benefited over the past several weeks.  She reports positive developments such as doing well on exams in school and quitting marijuana for the past 2 weeks.  No side effects noted.  No changes to medication regimen for today.   Identifying Information: Jamie Lopez is a 27 y.o. y.o. female with a history of major depressive disorder moderate who is an established patient with [facility] for management of depression and anxiety.   Plan:  # Major depressive disorder moderate  GAD Interventions: -- Continue Wellbutrin 450 mg daily - Continue Zoloft 200 mg daily - Continue buspirone 15 mg daily - Discontinue hydroxyzine, patient reports that she is not taking this medication and has not found it useful -  Continue propranolol 10 mg 3 times daily  # Cannabis use disorder Interventions: -- Very early remission at this point - Continue to monitor  # Systemic lupus erythematosus Interventions: -- Continue Plaquenil - Continue to follow with rheumatologist   Patient was given contact information for behavioral health clinic and was instructed to call 911 for emergencies.   Subjective:  Chief Complaint:  Chief Complaint  Patient presents with   Follow-up    Interval History:  The patient reports that she has been doing "better".  She reports full adherence to her medication regimen.  She feels that her mood has improved and is predominantly euthymic.  She reports restful sleep and good eating habits.  She reports that hopelessness and passive thoughts of not wanting to be alive are less frequent.  She denies any suicidal thoughts with plan or intention of harming herself.  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  She states that she has been doing well on exam so far.  She reports quitting marijuana 2 weeks ago without issue.  We discussed that this will be beneficial to her mental wellbeing.    Visit Diagnosis:    ICD-10-CM   1. MDD (major depressive disorder), recurrent episode, moderate (HCC)  F33.1 buPROPion (WELLBUTRIN XL) 300 MG 24 hr tablet    buPROPion (WELLBUTRIN XL) 150 MG 24 hr tablet    busPIRone (BUSPAR) 15 MG tablet    sertraline (ZOLOFT) 100 MG tablet    2. GAD (generalized anxiety disorder)  F41.1 busPIRone (BUSPAR) 15 MG tablet    propranolol (INDERAL) 10 MG tablet  sertraline (ZOLOFT) 100 MG tablet    hydrOXYzine (VISTARIL) 25 MG capsule    3. Insomnia due to other mental disorder  F51.05 busPIRone (BUSPAR) 15 MG tablet   F99 hydrOXYzine (VISTARIL) 25 MG capsule      Past Psychiatric History:  The patient denies any history of suicide attempts and there are no admissions for psychiatric reasons documented in the medical  record.  Past Medical History:  Past Medical History:  Diagnosis Date   Allergy 04/1999   Seasonal allergies   Anxiety    Asthma    Depression 01/2013   GERD (gastroesophageal reflux disease) 07/2020   Headache(784.0)    Lupus (HCC)    Rosacea    Seasonal allergies     Past Surgical History:  Procedure Laterality Date   WISDOM TOOTH EXTRACTION      Family Psychiatric History: None pertinent  Family History:  Family History  Problem Relation Age of Onset   Anxiety disorder Mother    Diabetes Mellitus II Mother    Depression Mother    Cancer Mother        ovarian   Diabetes Mother    Miscarriages / [place] Mother    Drug abuse Father    Asthma Father    Early death Father    Anxiety disorder Maternal Grandmother    Diabetes Mellitus II Maternal Grandmother    Depression Maternal Grandmother    Hypertension Maternal Grandmother    Anxiety disorder Maternal Grandfather    Depression Maternal Grandfather    Alcohol abuse Maternal Grandfather    Drug abuse Maternal Grandfather    Arthritis Maternal Grandfather    Alcohol abuse Paternal Grandfather    Drug abuse Paternal Grandfather    Bipolar disorder Cousin    Schizophrenia Cousin     Social History:  Social History   Socioeconomic History   Marital status: Single    Spouse name: Not on file   Number of children: 0   Years of education: 14   Highest education level: Master's degree (e.g., MA, MS, MEng, MEd, MSW, MBA)  Occupational History   Occupation: student    Comment: [employer]  Tobacco Use   Smoking status: Former    Current packs/day: 0.00    Types: Cigarettes, Cigars    Quit date: 2022    Years since quitting: 2.8   Smokeless tobacco: Never   Tobacco comments:    once a year or less when very stressed.  Vaping Use   Vaping status: Never Used  Substance and Sexual Activity   Alcohol use: Yes    Alcohol/week: 2.0 standard drinks of alcohol    Types: 2 Glasses of wine per week    Comment:  social: 1-2x/week   Drug use: Not Currently    Types: Marijuana    Comment: quit in 2018   Sexual activity: Yes    Partners: Male    Birth control/protection: Condom, Other-see comments    Comment: NuvaRing  Other Topics Concern   Not on file  Social History Narrative   Not on file   Social Determinants of Health   Financial Resource Strain: Medium Risk (10/06/2022)   Overall Financial Resource Strain (CARDIA)    Difficulty of Paying Living Expenses: Somewhat hard  Food Insecurity: No Food Insecurity (10/06/2022)   Hunger Vital Sign    Worried About Running Out of Food in the Last Year: Never true    Ran Out of Food in the Last Year: Never true  Transportation  Needs: No Transportation Needs (10/06/2022)   PRAPARE - Administrator, Civil Service (Medical): No    Lack of Transportation (Non-Medical): No  Physical Activity: Insufficiently Active (10/06/2022)   Exercise Vital Sign    Days of Exercise per Week: 2 days    Minutes of Exercise per Session: 40 min  Stress: Stress Concern Present (10/06/2022)   [employer] of Occupational Health - Occupational Stress Questionnaire    Feeling of Stress : To some extent  Social Connections: Moderately Integrated (10/06/2022)   Social Connection and Isolation Panel [NHANES]    Frequency of Communication with Friends and Family: More than three times a week    Frequency of Social Gatherings with Friends and Family: More than three times a week    Attends Religious Services: 1 to 4 times per year    Active Member of [employer] or Organizations: Yes    Attends Banker Meetings: More than 4 times per year    Marital Status: Never married    Allergies:  Allergies  Allergen Reactions   Peanut-Containing Drug Products     Tree nut specifically    Other     ALL FRUIT    Current Medications: Current Outpatient Medications  Medication Sig Dispense Refill   albuterol (VENTOLIN HFA) 108 (90 Base) MCG/ACT inhaler Inhale 2  puffs into the lungs every 6 (six) hours as needed for wheezing or shortness of breath. 8 g 6   budesonide (RHINOCORT ALLERGY) 32 MCG/ACT nasal spray Place 1-2 sprays into both nostrils daily. 8.6 g 6   buPROPion (WELLBUTRIN XL) 150 MG 24 hr tablet Take 1 tablet (150 mg total) by mouth daily. 30 tablet 2   buPROPion (WELLBUTRIN XL) 300 MG 24 hr tablet Take 1 tablet (300 mg total) by mouth every morning. 30 tablet 2   busPIRone (BUSPAR) 15 MG tablet Take 1 tablet (15 mg total) by mouth daily. 30 tablet 2   cetirizine (ZYRTEC ALLERGY) 10 MG tablet Take 1 tablet (10 mg total) by mouth daily. 30 tablet 0   diclofenac (VOLTAREN) 75 MG EC tablet Take 75 mg by mouth 2 (two) times daily.     etonogestrel-ethinyl estradiol (NUVARING) 0.12-0.015 MG/24HR vaginal ring Place 1 each vaginally every 28 (twenty-eight) days. Insert vaginally and leave in place for 3 consecutive weeks, then remove for 1 week.     hydroxychloroquine (PLAQUENIL) 200 MG tablet Take 200 mg by mouth 2 (two) times daily.     hydrOXYzine (VISTARIL) 25 MG capsule TAKE 1 TO 2 CAPSULES BY MOUTH EVERY DAY AT BEDTIME AS NEEDED FOR INSOMNIA 60 capsule 2   ibuprofen (ADVIL) 600 MG tablet Take 1 tablet (600 mg total) by mouth every 6 (six) hours as needed. 30 tablet 0   meloxicam (MOBIC) 15 MG tablet Take 15 mg by mouth daily as needed for pain.     naproxen (NAPROSYN) 500 MG tablet Take 1 tablet (500 mg total) by mouth 2 (two) times daily. 30 tablet 0   Olopatadine HCl (PATADAY) 0.2 % SOLN Apply 1 drop to eye daily as needed. 2.5 mL 3   ondansetron (ZOFRAN-ODT) 4 MG disintegrating tablet Take 1 tablet (4 mg total) by mouth as needed. 30 tablet 1   propranolol (INDERAL) 10 MG tablet Take 1 tablet (10 mg total) by mouth 3 (three) times daily. 90 tablet 2   pseudoephedrine (SUDAFED) 60 MG tablet Take 1 tablet (60 mg total) by mouth every 8 (eight) hours as needed for congestion. 30  tablet 0   rizatriptan (MAXALT-MLT) 10 MG disintegrating tablet Take  1 tablet (10 mg total) by mouth as needed for migraine. May repeat in 2 hours if needed 90 tablet 1   sertraline (ZOLOFT) 100 MG tablet Take 2 tablets (200 mg total) by mouth daily. 60 tablet 2   triamcinolone cream (KENALOG) 0.1 % Apply 1 application topically 2 (two) times daily. 30 g 0   valACYclovir (VALTREX) 1000 MG tablet Take 2,000 mg by mouth 2 (two) times daily.     Vitamin D, Ergocalciferol, (DRISDOL) 1.25 MG (50000 UNIT) CAPS capsule Take 1 capsule (50,000 Units total) by mouth every 7 (seven) days. 12 capsule 0   No current facility-administered medications for this visit.     Objective:  Psychiatric Specialty Exam: Physical Exam Constitutional:      Appearance: the patient is not toxic-appearing.  Pulmonary:     Effort: Pulmonary effort is normal.  Neurological:     General: No focal deficit present.     Mental Status: the patient is alert and oriented to person, place, and time.   Review of Systems  Respiratory:  Negative for shortness of breath.   Cardiovascular:  Negative for chest pain.  Gastrointestinal:  Negative for abdominal pain, constipation, diarrhea, nausea and vomiting.  Neurological:  Negative for headaches.      There were no vitals taken for this visit.  General Appearance: Fairly Groomed  Eye Contact:  Good  Speech:  Clear and Coherent  Volume:  Normal  Mood:  Euthymic  Affect:  Congruent  Thought Process:  Coherent  Orientation:  Full (Time, Place, and Person)  Thought Content: Logical   Suicidal Thoughts: Occasional thoughts of not wanting to be alive, as described above  Homicidal Thoughts:  No  Memory:  Immediate;   Good  Judgement:  fair  Insight:  fair  Psychomotor Activity:  Normal  Concentration:  Concentration: Good  Recall:  Good  Fund of Knowledge: Good  Language: Good  Akathisia:  No  Handed:    AIMS (if indicated): not done  Assets:  Communication Skills Desire for Improvement Financial  Resources/Insurance Housing Leisure Time Physical Health  ADL's:  Intact  Cognition: WNL  Sleep:  Fair     Metabolic Disorder Labs: Lab Results  Component Value Date   HGBA1C 5.9 08/02/2022   No results found for: "PROLACTIN" Lab Results  Component Value Date   CHOL 157 12/23/2019   TRIG 70 12/23/2019   HDL 48 12/23/2019   CHOLHDL 3.3 12/23/2019   LDLCALC 95 12/23/2019   LDLCALC 83 01/02/2019   Lab Results  Component Value Date   TSH 1.31 04/13/2022   TSH 1.17 07/13/2021    Therapeutic Level Labs: No results found for: "LITHIUM" No results found for: "VALPROATE" No results found for: "CBMZ"  Screenings: AUDIT    Flowsheet Row Office Visit from 10/06/2022 in [facility] [employer]  Alcohol Use Disorder Identification Test Final Score (AUDIT) 9      GAD-7    Flowsheet Row Office Visit from 09/09/2022 in [facility] [employer] Visit from 04/29/2022 in [facility] [employer] Visit from 01/28/2022 in [facility] [employer]  Total GAD-7 Score 12 4 6       PHQ2-9    Flowsheet Row Office Visit from 11/30/2022 in [facility] Video Visit from 11/17/2022 in [facility] Office Visit  from 09/09/2022 in [facility] Video [facility] Video Visit from [facility]/09/2022 [facility]  PHQ-2 Total Score 3 2 4 2 1   PHQ-9 Total Score 9 9 14 9 6       Flowsheet Row [facility]2022 [facility] ED from 10/04/2022 in [facility] Video Visit from 08/11/2022 [facility]  C-SSRS RISK CATEGORY No Risk No Risk No Risk        Collaboration of Care: none  A total of 30 minutes was spent involved in face to face clinical care, chart review, documentation.   Carlyn Reichert, MD 04/06/2023, 9:11 AM

## 2023-04-11 LAB — HM PAP SMEAR

## 2023-04-25 ENCOUNTER — Encounter: Payer: Self-pay | Admitting: Nurse Practitioner

## 2023-05-03 ENCOUNTER — Encounter: Payer: Medicaid Other | Admitting: Nurse Practitioner

## 2023-05-12 ENCOUNTER — Other Ambulatory Visit (HOSPITAL_COMMUNITY)
Admission: RE | Admit: 2023-05-12 | Discharge: 2023-05-12 | Disposition: A | Discharge status: Home / Self Care | Payer: Commercial Managed Care - HMO | Source: Ambulatory Visit | Attending: Nurse Practitioner | Admitting: Nurse Practitioner

## 2023-05-12 ENCOUNTER — Encounter: Payer: Self-pay | Admitting: Nurse Practitioner

## 2023-05-12 ENCOUNTER — Ambulatory Visit (INDEPENDENT_AMBULATORY_CARE_PROVIDER_SITE_OTHER): Payer: Self-pay | Admitting: Nurse Practitioner

## 2023-05-12 VITALS — BP 108/68 | HR 90 | Temp 97.6°F | Ht 62.0 in | Wt 200.0 lb

## 2023-05-12 DIAGNOSIS — Z23 Encounter for immunization: Secondary | ICD-10-CM

## 2023-05-12 DIAGNOSIS — Z0000 Encounter for general adult medical examination without abnormal findings: Secondary | ICD-10-CM

## 2023-05-12 DIAGNOSIS — F331 Major depressive disorder, recurrent, moderate: Secondary | ICD-10-CM

## 2023-05-12 DIAGNOSIS — N898 Other specified noninflammatory disorders of vagina: Secondary | ICD-10-CM | POA: Diagnosis present

## 2023-05-12 DIAGNOSIS — D500 Iron deficiency anemia secondary to blood loss (chronic): Secondary | ICD-10-CM

## 2023-05-12 DIAGNOSIS — B3731 Acute candidiasis of vulva and vagina: Secondary | ICD-10-CM | POA: Diagnosis not present

## 2023-05-12 DIAGNOSIS — M329 Systemic lupus erythematosus, unspecified: Secondary | ICD-10-CM

## 2023-05-12 DIAGNOSIS — F411 Generalized anxiety disorder: Secondary | ICD-10-CM

## 2023-05-12 DIAGNOSIS — E559 Vitamin D deficiency, unspecified: Secondary | ICD-10-CM

## 2023-05-12 DIAGNOSIS — K219 Gastro-esophageal reflux disease without esophagitis: Secondary | ICD-10-CM

## 2023-05-12 DIAGNOSIS — Z111 Encounter for screening for respiratory tuberculosis: Secondary | ICD-10-CM

## 2023-05-12 LAB — CBC WITH DIFFERENTIAL/PLATELET
Basophils Absolute: 0 10*3/uL (ref 0.0–0.1)
Basophils Relative: 1.2 % (ref 0.0–3.0)
Eosinophils Absolute: 0 10*3/uL (ref 0.0–0.7)
Eosinophils Relative: 0.8 % (ref 0.0–5.0)
HCT: 36.8 % (ref 36.0–46.0)
Hemoglobin: 12.2 g/dL (ref 12.0–15.0)
Lymphocytes Relative: 33.6 % (ref 12.0–46.0)
Lymphs Abs: 1.1 10*3/uL (ref 0.7–4.0)
MCHC: 33.1 g/dL (ref 30.0–36.0)
MCV: 88.4 fL (ref 78.0–100.0)
Monocytes Absolute: 0.4 10*3/uL (ref 0.1–1.0)
Monocytes Relative: 12.1 % — ABNORMAL HIGH (ref 3.0–12.0)
Neutro Abs: 1.7 10*3/uL (ref 1.4–7.7)
Neutrophils Relative %: 52.3 % (ref 43.0–77.0)
Platelets: 367 10*3/uL (ref 150.0–400.0)
RBC: 4.16 Mil/uL (ref 3.87–5.11)
RDW: 13.8 % (ref 11.5–15.5)
WBC: 3.2 10*3/uL — ABNORMAL LOW (ref 4.0–10.5)

## 2023-05-12 LAB — VITAMIN D 25 HYDROXY (VIT D DEFICIENCY, FRACTURES): VITD: 16.59 ng/mL — ABNORMAL LOW (ref 30.00–100.00)

## 2023-05-12 LAB — COMPREHENSIVE METABOLIC PANEL
ALT: 14 U/L (ref 0–35)
AST: 17 U/L (ref 0–37)
Albumin: 3.7 g/dL (ref 3.5–5.2)
Alkaline Phosphatase: 65 U/L (ref 39–117)
BUN: 10 mg/dL (ref 6–23)
CO2: 22 meq/L (ref 19–32)
Calcium: 8.4 mg/dL (ref 8.4–10.5)
Chloride: 106 meq/L (ref 96–112)
Creatinine, Ser: 0.66 mg/dL (ref 0.40–1.20)
GFR: 119.72 mL/min (ref >60.00)
Glucose, Bld: 91 mg/dL (ref 70–99)
Potassium: 4.3 meq/L (ref 3.5–5.1)
Sodium: 137 meq/L (ref 135–145)
Total Bilirubin: 0.4 mg/dL (ref 0.2–1.2)
Total Protein: 7.5 g/dL (ref 6.0–8.3)

## 2023-05-12 MED ORDER — OMEPRAZOLE 40 MG PO CPDR: 40.0000 mg | DELAYED_RELEASE_CAPSULE | Freq: Every day | ORAL | 1 refills | Status: DC

## 2023-05-12 NOTE — Assessment & Plan Note (Signed)
Will check CBC and iron panel today. She has not been taking her iron supplement due to reflux. Treat based on results.

## 2023-05-12 NOTE — Assessment & Plan Note (Signed)
Health maintenance reviewed and updated. Discussed nutrition, exercise. Check CMP, CBC today. Follow-up 1 year.   

## 2023-05-12 NOTE — Assessment & Plan Note (Signed)
Chronic, stable. Her psychiatrist has left the practice and would like to get refills here. Will continue wellbutrin 450mg  daily, buspar 15mg  daily, and zoloft 200mg  daily. She does not need refills today.

## 2023-05-12 NOTE — Assessment & Plan Note (Addendum)
Chronic, ongoing. Will start omeprazole 40mg  daily. She had a referral to GI last year, but never heard from them. Will place new referral and gave her the contact information.

## 2023-05-12 NOTE — Assessment & Plan Note (Signed)
Chronic, stable. She is currently following with rheumatology. Continue collaboration and recommendations from specialist.

## 2023-05-12 NOTE — Progress Notes (Signed)
BP 108/68 (BP Location: Right Arm, Patient Position: Sitting)   Pulse 90   Temp 97.6 F (36.4 C)   Ht 5\' 2"  (1.575 m)   Wt 200 lb (90.7 kg)   LMP 03/30/2023 (Exact Date)   SpO2 99%   BMI 36.58 kg/m    Subjective:    Patient ID: Jamie Lopez, female    DOB: [birth year], 27 y.o.   MRN: [id number]  CC: Chief Complaint  Patient presents with   Annual Exam    Possible yeast infection, request Quantiferon Gold and flu vaccine    HPI: Jamie Lopez is a 27 y.o. female presenting on 05/12/2023 for comprehensive medical examination. Current medical complaints include: possible yeast infection  She states that she has a history of yeast infections and feels like she may be getting another one. She is having some vaginal itching. She denies fever, urinary frequency, and dysuria.   She also notes that she is going back to school in January and needs a TB gold test and a letter saying she is ok to return after her LOA.   Her psychiatrist has left the practice and she is also wondering if I would be willing to prescribe her depression and anxiety medications. She states that she has been stable and last saw them in November 2024.   She currently lives with: mom, brother Menopausal Symptoms: no  Depression and Anxiety Screen done today and results listed below:     05/12/2023   10:59 AM 11/30/2022    9:24 AM 11/17/2022    4:22 PM 09/09/2022   11:48 AM 08/11/2022    3:18 PM  Depression screen PHQ 2/9  Decreased Interest 1 2  2    Down, Depressed, Hopeless 1 1  2    PHQ - 2 Score 2 3  4    Altered sleeping 2 1  3    Tired, decreased energy 2 1  1    Change in appetite 2 1  2    Feeling bad or failure about yourself  0 2  2   Trouble concentrating 0 1  2   Moving slowly or fidgety/restless 0 0  0   Suicidal thoughts 0 0  0   PHQ-9 Score 8 9  14    Difficult doing work/chores Not difficult at all Not difficult at all  Very difficult      Information is confidential and restricted. Go  to Review Flowsheets to unlock data.      05/12/2023   10:59 AM 09/09/2022   11:48 AM 04/29/2022    2:50 PM 01/28/2022    1:25 PM  GAD 7 : Generalized Anxiety Score  Nervous, Anxious, on Edge 2 2 1 2   Control/stop worrying 1 2 0 1  Worry too much - different things 2 2 1 1   Trouble relaxing 1 2 1  0  Restless 1 3 0 1  Easily annoyed or irritable 0 1 1 1   Afraid - awful might happen 0 0 0 0  Total GAD 7 Score 7 12 4 6   Anxiety Difficulty Not difficult at all Very difficult Somewhat difficult Somewhat difficult    The patient does not have a history of falls. I did not complete a risk assessment for falls. A plan of care for falls was not documented.   Past Medical History:  Past Medical History:  Diagnosis Date   Allergy 04/1999   Seasonal allergies   Anxiety    Asthma    Depression 01/2013   GERD (gastroesophageal reflux  disease) 07/2020   Headache(784.0)    Lupus    Rosacea    Seasonal allergies     Surgical History:  Past Surgical History:  Procedure Laterality Date   WISDOM TOOTH EXTRACTION      Medications:  Current Outpatient Medications on File Prior to Visit  Medication Sig   albuterol (VENTOLIN HFA) 108 (90 Base) MCG/ACT inhaler Inhale 2 puffs into the lungs every 6 (six) hours as needed for wheezing or shortness of breath.   budesonide (RHINOCORT ALLERGY) 32 MCG/ACT nasal spray Place 1-2 sprays into both nostrils daily.   buPROPion (WELLBUTRIN XL) 150 MG 24 hr tablet Take 1 tablet (150 mg total) by mouth daily.   buPROPion (WELLBUTRIN XL) 300 MG 24 hr tablet Take 1 tablet (300 mg total) by mouth every morning.   busPIRone (BUSPAR) 15 MG tablet Take 1 tablet (15 mg total) by mouth daily.   cetirizine (ZYRTEC ALLERGY) 10 MG tablet Take 1 tablet (10 mg total) by mouth daily.   etonogestrel-ethinyl estradiol (NUVARING) 0.12-0.015 MG/24HR vaginal ring Place 1 each vaginally every 28 (twenty-eight) days. Insert vaginally and leave in place for 3 consecutive weeks,  then remove for 1 week.   hydroxychloroquine (PLAQUENIL) 200 MG tablet Take 200 mg by mouth 2 (two) times daily.   ibuprofen (ADVIL) 600 MG tablet Take 1 tablet (600 mg total) by mouth every 6 (six) hours as needed.   meloxicam (MOBIC) 15 MG tablet Take 15 mg by mouth daily as needed for pain.   Olopatadine HCl (PATADAY) 0.2 % SOLN Apply 1 drop to eye daily as needed.   ondansetron (ZOFRAN-ODT) 4 MG disintegrating tablet Take 1 tablet (4 mg total) by mouth as needed.   propranolol (INDERAL) 10 MG tablet Take 1 tablet (10 mg total) by mouth 3 (three) times daily.   pseudoephedrine (SUDAFED) 60 MG tablet Take 1 tablet (60 mg total) by mouth every 8 (eight) hours as needed for congestion.   rizatriptan (MAXALT-MLT) 10 MG disintegrating tablet Take 1 tablet (10 mg total) by mouth as needed for migraine. May repeat in 2 hours if needed   sertraline (ZOLOFT) 100 MG tablet Take 2 tablets (200 mg total) by mouth daily.   triamcinolone cream (KENALOG) 0.1 % Apply 1 application topically 2 (two) times daily.   valACYclovir (VALTREX) 1000 MG tablet Take 2,000 mg by mouth 2 (two) times daily.   diclofenac (VOLTAREN) 75 MG EC tablet Take 75 mg by mouth 2 (two) times daily. (Patient not taking: Reported on 05/12/2023)   naproxen (NAPROSYN) 500 MG tablet Take 1 tablet (500 mg total) by mouth 2 (two) times daily. (Patient not taking: Reported on 05/12/2023)   No current facility-administered medications on file prior to visit.    Allergies:  Allergies  Allergen Reactions   Peanut-Containing Drug Products     Tree nut specifically    Other     ALL FRUIT    Social History:  Social History   Socioeconomic History   Marital status: Single    Spouse name: Not on file   Number of children: 0   Years of education: 14   Highest education level: Master's degree (e.g., MA, MS, MEng, MEd, MSW, MBA)  Occupational History   Occupation: student    Comment: [employer]  Tobacco Use   Smoking status: Former     Current packs/day: 0.00    Types: Cigarettes, Cigars    Quit date: 2022    Years since quitting: 2.9   Smokeless tobacco:  Never   Tobacco comments:    once a year or less when very stressed.  Vaping Use   Vaping status: Never Used  Substance and Sexual Activity   Alcohol use: Yes    Alcohol/week: 2.0 standard drinks of alcohol    Types: 2 Glasses of wine per week    Comment: social: 1-2x/week   Drug use: Not Currently    Types: Marijuana    Comment: quit in 2018   Sexual activity: Yes    Partners: Male    Birth control/protection: Condom, Other-see comments    Comment: NuvaRing  Other Topics Concern   Not on file  Social History Narrative   Not on file   Social Drivers of Health   Financial Resource Strain: Medium Risk (10/06/2022)   Overall Financial Resource Strain (CARDIA)    Difficulty of Paying Living Expenses: Somewhat hard  Food Insecurity: No Food Insecurity (05/11/2023)   Hunger Vital Sign    Worried About Running Out of Food in the Last Year: Never true    Ran Out of Food in the Last Year: Never true  Transportation Needs: No Transportation Needs (05/11/2023)   PRAPARE - Administrator, Civil Service (Medical): No    Lack of Transportation (Non-Medical): No  Physical Activity: Insufficiently Active (05/11/2023)   Exercise Vital Sign    Days of Exercise per Week: 2 days    Minutes of Exercise per Session: 20 min  Stress: Stress Concern Present (05/11/2023)   [employer] [employer] - Occupational Stress Questionnaire    Feeling of Stress : Very much  Social Connections: Moderately Integrated (05/11/2023)   Social Connection and Isolation Panel [NHANES]    Frequency of Communication with Friends and Family: More than three times a week    Frequency of Social Gatherings with Friends and Family: More than three times a week    Attends Religious Services: 1 to 4 times per year    Active Member of [employer] or Organizations: Yes    Attends  Banker Meetings: More than 4 times per year    Marital Status: Never married  Intimate Partner Violence: Unknown (04/30/2022)   Received from [employer], [facility]   HITS    Physically Hurt: Not on file    Insult or Talk Down To: Not on file    Threaten Physical Harm: Not on file    Scream or Curse: Not on file   Social History   Tobacco Use  Smoking Status Former   Current packs/day: 0.00   Types: Cigarettes, Cigars   Quit date: 2022   Years since quitting: 2.9  Smokeless Tobacco Never  Tobacco Comments   once a year or less when very stressed.   Social History   Substance and Sexual Activity  Alcohol Use Yes   Alcohol/week: 2.0 standard drinks of alcohol   Types: 2 Glasses of wine per week   Comment: social: 1-2x/week    Family History:  Family History  Problem Relation Age of Onset   Anxiety disorder Mother    Diabetes Mellitus II Mother    Depression Mother    Cancer Mother        ovarian   Diabetes Mother    Miscarriages / [place] Mother    Drug abuse Father    Asthma Father    Early death Father    Anxiety disorder Maternal Grandmother    Diabetes Mellitus II Maternal Grandmother    Depression Maternal Grandmother  Hypertension Maternal Grandmother    Anxiety disorder Maternal Grandfather    Depression Maternal Grandfather    Alcohol abuse Maternal Grandfather    Drug abuse Maternal Grandfather    Arthritis Maternal Grandfather    Alcohol abuse Paternal Grandfather    Drug abuse Paternal Grandfather    Bipolar disorder Cousin    Schizophrenia Cousin     Past medical history, surgical history, medications, allergies, family history and social history reviewed with patient today and changes made to appropriate areas of the chart.   Review of Systems  Constitutional: Negative.   HENT: Negative.    Eyes: Negative.   Respiratory: Negative.    Cardiovascular: Negative.   Gastrointestinal:  Positive for heartburn. Negative  for constipation, diarrhea and vomiting.  Genitourinary:  Negative for dysuria, frequency and urgency.       Vaginal itching  Musculoskeletal: Negative.   Skin: Negative.   Neurological: Negative.   Psychiatric/Behavioral: Negative.     All other ROS negative except what is listed above and in the HPI.      Objective:    BP 108/68 (BP Location: Right Arm, Patient Position: Sitting)   Pulse 90   Temp 97.6 F (36.4 C)   Ht 5\' 2"  (1.575 m)   Wt 200 lb (90.7 kg)   LMP 03/30/2023 (Exact Date)   SpO2 99%   BMI 36.58 kg/m   Wt Readings from Last 3 Encounters:  05/12/23 200 lb (90.7 kg)  11/30/22 193 lb 9.6 oz (87.8 kg)  11/02/22 197 lb (89.4 kg)    Physical Exam Vitals and nursing note reviewed.  Constitutional:      General: She is not in acute distress.    Appearance: Normal appearance.  HENT:     Head: Normocephalic and atraumatic.     Right Ear: Tympanic membrane, ear canal and external ear normal.     Left Ear: Tympanic membrane, ear canal and external ear normal.  Eyes:     Conjunctiva/sclera: Conjunctivae normal.  Cardiovascular:     Rate and Rhythm: Normal rate and regular rhythm.     Pulses: Normal pulses.     Heart sounds: Normal heart sounds.  Pulmonary:     Effort: Pulmonary effort is normal.     Breath sounds: Normal breath sounds.  Abdominal:     Palpations: Abdomen is soft.     Tenderness: There is no abdominal tenderness.  Musculoskeletal:        General: Normal range of motion.     Cervical back: Normal range of motion and neck supple.     Right lower leg: No edema.     Left lower leg: No edema.  Lymphadenopathy:     Cervical: No cervical adenopathy.  Skin:    General: Skin is warm and dry.  Neurological:     General: No focal deficit present.     Mental Status: She is alert and oriented to person, place, and time.     Cranial Nerves: No cranial nerve deficit.     Coordination: Coordination normal.     Gait: Gait normal.  Psychiatric:         Mood and Affect: Mood normal.        Behavior: Behavior normal.        Thought Content: Thought content normal.        Judgment: Judgment normal.     Results for orders placed or performed in visit on 11/30/22  Cervicovaginal ancillary only   Collection Time: 11/30/22  9:52 AM  Result Value Ref Range   Neisseria Gonorrhea Negative    Chlamydia Negative    Trichomonas Negative    Bacterial Vaginitis (gardnerella) Negative    Candida Vaginitis Negative    Candida Glabrata Negative    Comment      Normal Reference Range Bacterial Vaginosis - Negative   Comment Normal Reference Range Candida Species - Negative    Comment Normal Reference Range Candida Galbrata - Negative    Comment Normal Reference Range Trichomonas - Negative    Comment Normal Reference Ranger Chlamydia - Negative    Comment      Normal Reference Range Neisseria Gonorrhea - Negative  HSV(herpes simplex vrs) 1+2 ab-IgG   Collection Time: 11/30/22 10:13 AM  Result Value Ref Range   HSV 1 IGG,TYPE SPECIFIC AB 47.50 (H) index   HSV 2 IGG,TYPE SPECIFIC AB <0.90 index  RPR   Collection Time: 11/30/22 10:13 AM  Result Value Ref Range   RPR Ser Ql NON-REACTIVE NON-REACTIVE  HIV antibody (with reflex)   Collection Time: 11/30/22 10:13 AM  Result Value Ref Range   HIV 1&2 Ab, 4th Generation NON-REACTIVE NON-REACTIVE  POCT Urinalysis Dipstick (Automated)   Collection Time: 11/30/22 10:35 AM  Result Value Ref Range   Color, UA yellow    Clarity, UA clear    Glucose, UA Negative Negative   Bilirubin, UA neg    Ketones, UA neg    Spec Grav, UA 1.015 1.010 - 1.025   Blood, UA neg    pH, UA 6.0 5.0 - 8.0   Protein, UA Negative Negative   Urobilinogen, UA 0.2 0.2 or 1.0 E.U./dL   Nitrite, UA neg    Leukocytes, UA Negative Negative  POCT Pregnancy, Urine   Collection Time: 11/30/22 10:37 AM  Result Value Ref Range   Negative        Assessment & Plan:   Problem List Items Addressed This Visit       Digestive    Gastroesophageal reflux disease   Chronic, ongoing. Will start omeprazole 40mg  daily. She had a referral to GI last year, but never heard from them. Will place new referral and gave her the contact information.       Relevant Medications   omeprazole (PRILOSEC) 40 MG capsule     Other   GAD (generalized anxiety disorder)   Chronic, stable. Her psychiatrist has left the practice and would like to get refills here. Will continue wellbutrin 450mg  daily, buspar 15mg  daily, and zoloft 200mg  daily. She does not need refills today.       Systemic lupus erythematosus (HCC)   Chronic, stable. She is currently following with rheumatology. Continue collaboration and recommendations from specialist.       Vitamin D deficiency   Check vitamin D levels and treat based on results. She has not been taking a supplement due to it worsening her reflux.       Relevant Orders   VITAMIN D 25 Hydroxy (Vit-D Deficiency, Fractures)   Iron deficiency anemia due to chronic blood loss   Will check CBC and iron panel today. She has not been taking her iron supplement due to reflux. Treat based on results.       Relevant Orders   CBC with Differential/Platelet   Iron, TIBC and Ferritin Panel   MDD (major depressive disorder), recurrent episode, moderate (HCC)   Chronic, stable. Her psychiatrist has left the practice and would like to get refills here. Will continue wellbutrin 450mg  daily,  buspar 15mg  daily, and zoloft 200mg  daily. She does not need refills today.       Routine general medical examination at a health care facility - Primary   Health maintenance reviewed and updated. Discussed nutrition, exercise. Check CMP, CBC today. Follow-up 1 year.        Relevant Orders   CBC with Differential/Platelet   Comprehensive metabolic panel   Other Visit Diagnoses       Vaginal itching       Will check swab for BV, yeast, gonorrhea, and chlamydia. Treat based on results.   Relevant Orders    Cervicovaginal ancillary only     Screening-pulmonary TB       TB test done today for school .   Relevant Orders   QuantiFERON-TB Gold Plus     Immunization due       Flu vaccine given today.   Relevant Orders   Flu vaccine trivalent PF, 6mos and older(Flulaval,Afluria,Fluarix,Fluzone) (Completed)        Follow up plan: Return in about 1 year (around 05/11/2024) for CPE.   LABORATORY TESTING:  - Pap smear: up to date  IMMUNIZATIONS:   - Tdap: Tetanus vaccination status reviewed: last tetanus booster within 10 years. - Influenza: Administered today - Pneumovax: Not applicable - Prevnar: Not applicable - HPV: Not applicable - Shingrix vaccine: Not applicable  SCREENING: -Mammogram: Not applicable  - Colonoscopy: Not applicable  - Bone Density: Not applicable   PATIENT COUNSELING:   Advised to take 1 mg of folate supplement per day if capable of pregnancy.   Sexuality: Discussed sexually transmitted diseases, partner selection, use of condoms, avoidance of unintended pregnancy  and contraceptive alternatives.   Advised to avoid cigarette smoking.  I discussed with the patient that most people either abstain from alcohol or drink within safe limits (<=14/week and <=4 drinks/occasion for males, <=7/weeks and <= 3 drinks/occasion for females) and that the risk for alcohol disorders and other health effects rises proportionally with the number of drinks per week and how often a drinker exceeds daily limits.  Discussed cessation/primary prevention of drug use and availability of treatment for abuse.   Diet: Encouraged to adjust caloric intake to maintain  or achieve ideal body weight, to reduce intake of dietary saturated fat and total fat, to limit sodium intake by avoiding high sodium foods and not adding table salt, and to maintain adequate dietary potassium and calcium preferably from fresh fruits, vegetables, and low-fat dairy products.    stressed the importance of regular  exercise  Injury prevention: Discussed safety belts, safety helmets, smoke detector, smoking near bedding or upholstery.   Dental health: Discussed importance of regular tooth brushing, flossing, and dental visits.    NEXT PREVENTATIVE PHYSICAL DUE IN 1 YEAR. Return in about 1 year (around 05/11/2024) for CPE.  Tierrah Anastos A Alyza Artiaga

## 2023-05-12 NOTE — Patient Instructions (Addendum)
It was great to see you!  We are checking your labs today and will let you know the results via mychart/phone.   [facility]: Address: [address], [place], [place] [address] Phone: [phone]  Start omeprazole once a day in the morning before you eat. Try to wait 30 minutes before eating or drinking.   Let's follow-up in 1 year, sooner if you have concerns.  If a referral was placed today, you will be contacted for an appointment. Please note that routine referrals can sometimes take up to 3-4 weeks to process. Please call our office if you haven't heard anything after this time frame.  Take care,  Rodman Pickle, NP

## 2023-05-12 NOTE — Assessment & Plan Note (Signed)
Check vitamin D levels and treat based on results. She has not been taking a supplement due to it worsening her reflux.

## 2023-05-15 ENCOUNTER — Encounter: Payer: Self-pay | Admitting: Nurse Practitioner

## 2023-05-15 DIAGNOSIS — Z111 Encounter for screening for respiratory tuberculosis: Secondary | ICD-10-CM

## 2023-05-15 LAB — CERVICOVAGINAL ANCILLARY ONLY
Bacterial Vaginitis (gardnerella): NEGATIVE
Candida Glabrata: NEGATIVE
Candida Vaginitis: POSITIVE — AB
Chlamydia: NEGATIVE
Comment: NEGATIVE
Comment: NEGATIVE
Comment: NEGATIVE
Comment: NEGATIVE
Comment: NEGATIVE
Comment: NORMAL
Neisseria Gonorrhea: NEGATIVE
Trichomonas: NEGATIVE

## 2023-05-15 MED ORDER — TERCONAZOLE 0.8 % VA CREA: 1.0000 | TOPICAL_CREAM | Freq: Every day | VAGINAL | 0 refills | Status: DC

## 2023-05-15 NOTE — Addendum Note (Signed)
Addended by: Rodman Pickle A on: 05/15/2023 04:30 PM   Modules accepted: Orders

## 2023-05-16 LAB — QUANTIFERON-TB GOLD PLUS
Mitogen-NIL: 0.49 [IU]/mL
NIL: 0.01 [IU]/mL
QuantiFERON-TB Gold Plus: UNDETERMINED — AB
TB1-NIL: 0 [IU]/mL
TB2-NIL: 0 [IU]/mL

## 2023-05-16 LAB — IRON,TIBC AND FERRITIN PANEL
%SAT: 18 % (ref 16–45)
Ferritin: 15 ng/mL — ABNORMAL LOW (ref 16–154)
Iron: 84 ug/dL (ref 40–190)
TIBC: 455 ug/dL — ABNORMAL HIGH (ref 250–450)

## 2023-05-17 NOTE — Addendum Note (Signed)
Addended by: Rodman Pickle A on: 05/17/2023 02:05 PM   Modules accepted: Orders

## 2023-05-19 ENCOUNTER — Other Ambulatory Visit: Payer: Commercial Managed Care - HMO

## 2023-05-19 DIAGNOSIS — Z111 Encounter for screening for respiratory tuberculosis: Secondary | ICD-10-CM

## 2023-05-19 NOTE — Addendum Note (Signed)
Addended by: Tonita Phoenix on: 05/19/2023 01:52 PM   Modules accepted: Orders

## 2023-05-26 LAB — QUANTIFERON-TB GOLD PLUS
QuantiFERON Mitogen Value: >10 [IU]/mL
QuantiFERON Nil Value: 0.03 [IU]/mL
QuantiFERON TB1 Ag Value: 0.03 [IU]/mL
QuantiFERON TB2 Ag Value: 0.03 [IU]/mL
QuantiFERON-TB Gold Plus: NEGATIVE

## 2023-05-29 ENCOUNTER — Telehealth: Payer: Self-pay | Admitting: Nurse Practitioner

## 2023-06-02 ENCOUNTER — Ambulatory Visit
Admission: RE | Admit: 2023-06-02 | Discharge: 2023-06-02 | Disposition: A | Discharge status: Home / Self Care | Payer: Commercial Managed Care - HMO | Source: Ambulatory Visit | Attending: Physician Assistant | Admitting: Physician Assistant

## 2023-06-02 VITALS — BP 104/74 | HR 85 | Temp 98.3°F | Resp 18 | Ht 62.0 in | Wt 200.0 lb

## 2023-06-02 DIAGNOSIS — N898 Other specified noninflammatory disorders of vagina: Secondary | ICD-10-CM | POA: Insufficient documentation

## 2023-06-02 NOTE — ED Triage Notes (Signed)
 Just got over a yeast infection and want to be checked again to be sure. Still having discomfort. - Entered by patient  "I use Nuva-ring as birth control, I did that treatment and did a suppository but symptoms have returned". To discuss.

## 2023-06-05 LAB — CERVICOVAGINAL ANCILLARY ONLY
Bacterial Vaginitis (gardnerella): POSITIVE — AB
Candida Glabrata: NEGATIVE
Candida Vaginitis: POSITIVE — AB
Chlamydia: NEGATIVE
Comment: NEGATIVE
Comment: NEGATIVE
Comment: NEGATIVE
Comment: NEGATIVE
Comment: NEGATIVE
Comment: NORMAL
Neisseria Gonorrhea: NEGATIVE
Trichomonas: NEGATIVE

## 2023-06-06 MED ORDER — METRONIDAZOLE 500 MG PO TABS: 500.0000 mg | ORAL_TABLET | Freq: Two times a day (BID) | ORAL | 0 refills | Status: AC

## 2023-06-06 MED ORDER — TERCONAZOLE 0.8 % VA CREA: 1.0000 | TOPICAL_CREAM | Freq: Every day | VAGINAL | 0 refills | Status: AC

## 2023-06-07 ENCOUNTER — Telehealth (HOSPITAL_COMMUNITY): Payer: Self-pay | Admitting: Emergency Medicine

## 2023-06-07 NOTE — Telephone Encounter (Signed)
 Opened in error

## 2023-06-07 NOTE — ED Provider Notes (Signed)
 [facility]    CSN: [id number] Arrival date & time: 06/02/23  1215      History   Chief Complaint Chief Complaint  Patient presents with   Vaginal Itching    HPI Jamie Lopez is a 28 y.o. female.   Patient here today for evaluation of vaginal discomfort.  She reports she just was treated for yeast infection and this has resolved in part but she is still symptomatic.  She reports that she uses Nuva Ring for contraceptions and tried a suppository for treatment but symptoms returned. She denies any known STD exposure  The history is provided by the patient.  Vaginal Itching Pertinent negatives include no abdominal pain and no shortness of breath.    Past Medical History:  Diagnosis Date   Allergy 04/1999   Seasonal allergies   Anxiety    Asthma    Depression 01/2013   GERD (gastroesophageal reflux disease) 07/2020   Headache(784.0)    Lupus    Rosacea    Seasonal allergies     Patient Active Problem List   Diagnosis Date Noted   Routine general medical examination at a health care facility 05/12/2023   MDD (major depressive disorder), recurrent episode, moderate (HCC) 02/21/2023   Insomnia due to other mental disorder 02/21/2023   Chest pain 10/06/2022   Lymphadenopathy 10/06/2022   Iron deficiency anemia due to chronic blood loss 04/29/2022   Dizziness 04/14/2022   Vitamin D  deficiency 04/14/2022   Gastroesophageal reflux disease 01/28/2022   Chronic fatigue 01/28/2022   Systemic lupus erythematosus (HCC) 07/16/2020   GAD (generalized anxiety disorder) 10/25/2012   Migraine 05/28/2011    Past Surgical History:  Procedure Laterality Date   WISDOM TOOTH EXTRACTION      OB History   No obstetric history on file.      Home Medications    Prior to Admission medications   Medication Sig Start Date End Date Taking? Authorizing Provider  buPROPion  (WELLBUTRIN  XL) 300 MG 24 hr tablet Take 1 tablet (300 mg total) by mouth every morning.  04/05/23 04/04/24 Yes Marry Clamp, MD  busPIRone  (BUSPAR ) 15 MG tablet Take 1 tablet (15 mg total) by mouth daily. 04/05/23  Yes Marry Clamp, MD  etonogestrel-ethinyl estradiol (NUVARING) 0.12-0.015 MG/24HR vaginal ring Place 1 each vaginally every 28 (twenty-eight) days. Insert vaginally and leave in place for 3 consecutive weeks, then remove for 1 week.   Yes [provider]  hydroxychloroquine (PLAQUENIL) 200 MG tablet Take 200 mg by mouth 2 (two) times daily. 06/30/20  Yes [provider]  omeprazole  (PRILOSEC) 40 MG capsule Take 1 capsule (40 mg total) by mouth daily. 05/12/23  Yes McElwee, Lauren A, NP  propranolol  (INDERAL ) 10 MG tablet Take 1 tablet (10 mg total) by mouth 3 (three) times daily. Patient taking differently: Take 30 mg by mouth 3 (three) times daily. 04/05/23  Yes Marry Clamp, MD  sertraline  (ZOLOFT ) 100 MG tablet Take 2 tablets (200 mg total) by mouth daily. 04/05/23  Yes Marry Clamp, MD  albuterol  (VENTOLIN  HFA) 108 [phone] Base) MCG/ACT inhaler Inhale 2 puffs into the lungs every 6 (six) hours as needed for wheezing or shortness of breath. 04/29/22   McElwee, Tinnie LABOR, NP  budesonide  (RHINOCORT  ALLERGY) 32 MCG/ACT nasal spray Place 1-2 sprays into both nostrils daily. 08/25/18   Levora Reyes SAUNDERS, MD  buPROPion  (WELLBUTRIN  XL) 150 MG 24 hr tablet Take 1 tablet (150 mg total) by mouth daily. 04/05/23   Marry Clamp, MD  cetirizine  (  ZYRTEC  ALLERGY) 10 MG tablet Take 1 tablet (10 mg total) by mouth daily. 06/28/22   Christopher Savannah, PA-C  diclofenac (VOLTAREN) 75 MG EC tablet Take 75 mg by mouth 2 (two) times daily. Patient not taking: Reported on 05/12/2023    [provider]  ibuprofen  (ADVIL ) 600 MG tablet Take 1 tablet (600 mg total) by mouth every 6 (six) hours as needed. 06/28/22   Christopher Savannah, PA-C  meloxicam (MOBIC) 15 MG tablet Take 15 mg by mouth daily as needed for pain.    [provider]  metroNIDAZOLE  (FLAGYL ) 500 MG tablet Take 1  tablet (500 mg total) by mouth 2 (two) times daily for 7 days. 06/06/23 06/13/23  Arloa Suzen RAMAN, NP  naproxen  (NAPROSYN ) 500 MG tablet Take 1 tablet (500 mg total) by mouth 2 (two) times daily. Patient not taking: Reported on 05/12/2023 06/01/18   Norine Glennon RAMAN, PA-C  Olopatadine  HCl (PATADAY ) 0.2 % SOLN Apply 1 drop to eye daily as needed. 09/09/22   McElwee, Lauren A, NP  ondansetron  (ZOFRAN -ODT) 4 MG disintegrating tablet Take 1 tablet (4 mg total) by mouth as needed. 04/29/22   McElwee, Lauren A, NP  pseudoephedrine  (SUDAFED) 60 MG tablet Take 1 tablet (60 mg total) by mouth every 8 (eight) hours as needed for congestion. 06/28/22   Christopher Savannah, PA-C  rizatriptan  (MAXALT -MLT) 10 MG disintegrating tablet Take 1 tablet (10 mg total) by mouth as needed for migraine. May repeat in 2 hours if needed 01/29/21   Kip Ade, NP  terconazole  (TERAZOL 3 ) 0.8 % vaginal cream Place 1 applicator vaginally at bedtime. 05/15/23   McElwee, Lauren A, NP  terconazole  (TERAZOL 3 ) 0.8 % vaginal cream Place 1 applicator vaginally at bedtime for 3 days. 06/06/23 06/09/23  Arloa Suzen RAMAN, NP  triamcinolone  cream (KENALOG ) 0.1 % Apply 1 application topically 2 (two) times daily. 01/02/19   Kip Ade, NP  valACYclovir (VALTREX) 1000 MG tablet Take 2,000 mg by mouth 2 (two) times daily.    [provider]    Family History Family History  Problem Relation Age of Onset   Anxiety disorder Mother    Diabetes Mellitus II Mother    Depression Mother    Cancer Mother        ovarian   Diabetes Mother    Miscarriages / Stillbirths Mother    Drug abuse Father    Asthma Father    Early death Father    Anxiety disorder Maternal Grandmother    Diabetes Mellitus II Maternal Grandmother    Depression Maternal Grandmother    Hypertension Maternal Grandmother    Anxiety disorder Maternal Grandfather    Depression Maternal Grandfather    Alcohol abuse Maternal Grandfather    Drug abuse Maternal  Grandfather    Arthritis Maternal Grandfather    Alcohol abuse Paternal Grandfather    Drug abuse Paternal Grandfather    Bipolar disorder Cousin    Schizophrenia Cousin     Social History Social History   Tobacco Use   Smoking status: Former    Current packs/day: 0.00    Types: Cigarettes, Cigars    Quit date: 2022    Years since quitting: 3.0   Smokeless tobacco: Never   Tobacco comments:    once a year or less when very stressed.  Vaping Use   Vaping status: Never Used  Substance Use Topics   Alcohol use: Yes    Alcohol/week: 2.0 standard drinks of alcohol    Types: 2 Glasses  of wine per week    Comment: social: 1-2x/week   Drug use: Not Currently    Types: Marijuana    Comment: quit in 2018     Allergies   Peanut-containing drug products and Other   Review of Systems Review of Systems  Constitutional:  Negative for chills and fever.  Eyes:  Negative for discharge and redness.  Respiratory:  Negative for shortness of breath.   Gastrointestinal:  Negative for abdominal pain, nausea and vomiting.  Genitourinary:  Positive for vaginal discharge. Negative for vaginal bleeding.     Physical Exam Triage Vital Signs ED Triage Vitals  Encounter Vitals Group     BP 06/02/23 1224 104/74     Systolic BP Percentile --      Diastolic BP Percentile --      Pulse Rate 06/02/23 1224 85     Resp 06/02/23 1224 18     Temp 06/02/23 1224 98.3 F (36.8 C)     Temp Source 06/02/23 1224 Oral     SpO2 06/02/23 1224 98 %     Weight 06/02/23 1221 200 lb (90.7 kg)     Height 06/02/23 1221 5' 2 (1.575 m)     Head Circumference --      Peak Flow --      Pain Score 06/02/23 1219 0     Pain Loc --      Pain Education --      Exclude from Growth Chart --    No data found.  Updated Vital Signs BP 104/74 (BP Location: Left Arm)   Pulse 85   Temp 98.3 F (36.8 C) (Oral)   Resp 18   Ht 5' 2 (1.575 m)   Wt 200 lb (90.7 kg)   LMP 03/30/2023 (Exact Date)   SpO2 98%    BMI 36.58 kg/m   Visual Acuity Right Eye Distance:   Left Eye Distance:   Bilateral Distance:    Right Eye Near:   Left Eye Near:    Bilateral Near:     Physical Exam Vitals and nursing note reviewed.  Constitutional:      General: She is not in acute distress.    Appearance: Normal appearance. She is not ill-appearing.  HENT:     Head: Normocephalic and atraumatic.  Eyes:     Conjunctiva/sclera: Conjunctivae normal.  Cardiovascular:     Rate and Rhythm: Normal rate.  Pulmonary:     Effort: Pulmonary effort is normal. No respiratory distress.  Neurological:     Mental Status: She is alert.  Psychiatric:        Mood and Affect: Mood normal.        Behavior: Behavior normal.        Thought Content: Thought content normal.      UC Treatments / Results  Labs (all labs ordered are listed, but only abnormal results are displayed) Labs Reviewed  CERVICOVAGINAL ANCILLARY ONLY - Abnormal; Notable for the following components:      Result Value   Bacterial Vaginitis (gardnerella) Positive (*)    Candida Vaginitis Positive (*)    All other components within normal limits    EKG   Radiology No results found.  Procedures Procedures (including critical care time)  Medications Ordered in UC Medications - No data to display  Initial Impression / Assessment and Plan / UC Course  I have reviewed the triage vital signs and the nursing notes.  Pertinent labs & imaging results that were available during my  care of the patient were reviewed by me and considered in my medical decision making (see chart for details).    Will order screening for BV and yeast.  Will also order STD screening.  Will await further recommendation and encouraged follow-up with any further concerns.   Final Clinical Impressions(s) / UC Diagnoses   Final diagnoses:  Vaginal irritation   Discharge Instructions   None    ED Prescriptions   None    PDMP not reviewed this encounter.    Billy Asberry FALCON, PA-C 06/07/23 [phone]

## 2023-06-08 ENCOUNTER — Other Ambulatory Visit: Payer: Self-pay | Admitting: Nurse Practitioner

## 2023-06-09 NOTE — Telephone Encounter (Signed)
 Requesting: OMEPRAZOLE DR 40 MG CAPSULE  Last Visit: 05/12/2023 Next Visit: Visit date not found Last Refill: 05/12/2023  Please Advise

## 2023-06-13 ENCOUNTER — Encounter: Payer: Self-pay | Admitting: Internal Medicine

## 2023-06-13 ENCOUNTER — Ambulatory Visit (INDEPENDENT_AMBULATORY_CARE_PROVIDER_SITE_OTHER): Payer: Commercial Managed Care - HMO | Admitting: Internal Medicine

## 2023-06-13 VITALS — BP 122/82 | HR 80 | Temp 98.3°F | Ht 62.0 in | Wt 198.0 lb

## 2023-06-13 DIAGNOSIS — F411 Generalized anxiety disorder: Secondary | ICD-10-CM | POA: Diagnosis not present

## 2023-06-13 DIAGNOSIS — H9313 Tinnitus, bilateral: Secondary | ICD-10-CM

## 2023-06-13 MED ORDER — PROPRANOLOL HCL 10 MG PO TABS: 20.0000 mg | ORAL_TABLET | Freq: Three times a day (TID) | ORAL | Status: DC

## 2023-06-13 NOTE — Progress Notes (Signed)
 [facility]IMARY [facility] [address] [place] [address] Dept: [phone] Dept Fax: [phone]  Acute Care Office Visit  Subjective:   Jamie Lopez Jan 03, 1996 06/13/2023  Chief Complaint  Patient presents with   Tinnitus    Started on saturday    HPI: Discussed the use of AI scribe software for clinical note transcription with the patient, who gave verbal consent to proceed.  History of Present Illness   The patient, with a history of lupus, presents with tinnitus that began on Saturday. They describe it as a 'nonstop ringing' in their ears that is most noticeable in quiet environments and is keeping them awake at night. They deny any associated symptoms such as fever, chills, nasal congestion, runny nose, earache, dizziness, vision changes, hearing loss, numbness, tingling, or motor weakness. They report no recent exposure to loud noises,  or family history of hearing loss. She recently started back to [employer] school, has been having increased stress due to school.   In addition to the tinnitus, the patient has noticed an increase in their blood pressure, which is typically around 116/80. She reports her BP was elevated to 138/80's yesterday. They report feeling 'generally weird' and fatigued, but deny any joint pain or other signs of a lupus flare. They were recently treated for yeast and bacterial vaginosis with metronidazole .      The following portions of the patient's history were reviewed and updated as appropriate: past medical history, past surgical history, family history, social history, allergies, medications, and problem list.   Patient Active Problem List   Diagnosis Date Noted   Routine general medical examination at a health care facility 05/12/2023   MDD (major depressive disorder), recurrent episode, moderate (HCC) 02/21/2023   Insomnia due to other mental disorder 02/21/2023   Chest pain 10/06/2022    Lymphadenopathy 10/06/2022   Iron deficiency anemia due to chronic blood loss 04/29/2022   Dizziness 04/14/2022   Vitamin D  deficiency 04/14/2022   Gastroesophageal reflux disease 01/28/2022   Chronic fatigue 01/28/2022   Systemic lupus erythematosus (HCC) 07/16/2020   GAD (generalized anxiety disorder) 10/25/2012   Migraine 05/28/2011   Past Medical History:  Diagnosis Date   Allergy 04/1999   Seasonal allergies   Anxiety    Asthma    Depression 01/2013   GERD (gastroesophageal reflux disease) 07/2020   Headache(784.0)    Lupus    Rosacea    Seasonal allergies    Past Surgical History:  Procedure Laterality Date   WISDOM TOOTH EXTRACTION     Family History  Problem Relation Age of Onset   Anxiety disorder Mother    Diabetes Mellitus II Mother    Depression Mother    Cancer Mother        ovarian   Diabetes Mother    Miscarriages / Stillbirths Mother    Drug abuse Father    Asthma Father    Early death Father    Anxiety disorder Maternal Grandmother    Diabetes Mellitus II Maternal Grandmother    Depression Maternal Grandmother    Hypertension Maternal Grandmother    Anxiety disorder Maternal Grandfather    Depression Maternal Grandfather    Alcohol abuse Maternal Grandfather    Drug abuse Maternal Grandfather    Arthritis Maternal Grandfather    Alcohol abuse Paternal Grandfather    Drug abuse Paternal Grandfather    Bipolar disorder Cousin    Schizophrenia Cousin     Current Outpatient Medications:    albuterol  (  VENTOLIN  HFA) 108 (90 Base) MCG/ACT inhaler, Inhale 2 puffs into the lungs every 6 (six) hours as needed for wheezing or shortness of breath., Disp: 8 g, Rfl: 6   budesonide  (RHINOCORT  ALLERGY) 32 MCG/ACT nasal spray, Place 1-2 sprays into both nostrils daily., Disp: 8.6 g, Rfl: 6   buPROPion  (WELLBUTRIN  XL) 150 MG 24 hr tablet, Take 1 tablet (150 mg total) by mouth daily., Disp: 30 tablet, Rfl: 2   buPROPion  (WELLBUTRIN  XL) 300 MG 24 hr tablet, Take  1 tablet (300 mg total) by mouth every morning., Disp: 30 tablet, Rfl: 2   busPIRone  (BUSPAR ) 15 MG tablet, Take 1 tablet (15 mg total) by mouth daily., Disp: 30 tablet, Rfl: 2   cetirizine  (ZYRTEC  ALLERGY) 10 MG tablet, Take 1 tablet (10 mg total) by mouth daily., Disp: 30 tablet, Rfl: 0   diclofenac (VOLTAREN) 75 MG EC tablet, Take 75 mg by mouth 2 (two) times daily., Disp: , Rfl:    etonogestrel-ethinyl estradiol (NUVARING) 0.12-0.015 MG/24HR vaginal ring, Place 1 each vaginally every 28 (twenty-eight) days. Insert vaginally and leave in place for 3 consecutive weeks, then remove for 1 week., Disp: , Rfl:    hydroxychloroquine (PLAQUENIL) 200 MG tablet, Take 200 mg by mouth 2 (two) times daily., Disp: , Rfl:    ibuprofen  (ADVIL ) 600 MG tablet, Take 1 tablet (600 mg total) by mouth every 6 (six) hours as needed., Disp: 30 tablet, Rfl: 0   meloxicam (MOBIC) 15 MG tablet, Take 15 mg by mouth daily as needed for pain., Disp: , Rfl:    naproxen  (NAPROSYN ) 500 MG tablet, Take 1 tablet (500 mg total) by mouth 2 (two) times daily., Disp: 30 tablet, Rfl: 0   Olopatadine  HCl (PATADAY ) 0.2 % SOLN, Apply 1 drop to eye daily as needed., Disp: 2.5 mL, Rfl: 3   omeprazole  (PRILOSEC) 40 MG capsule, TAKE 1 CAPSULE (40 MG TOTAL) BY MOUTH DAILY., Disp: 90 capsule, Rfl: 1   ondansetron  (ZOFRAN -ODT) 4 MG disintegrating tablet, Take 1 tablet (4 mg total) by mouth as needed., Disp: 30 tablet, Rfl: 1   propranolol  (INDERAL ) 10 MG tablet, Take 1 tablet (10 mg total) by mouth 3 (three) times daily. (Patient taking differently: Take 30 mg by mouth 3 (three) times daily.), Disp: 90 tablet, Rfl: 2   pseudoephedrine  (SUDAFED) 60 MG tablet, Take 1 tablet (60 mg total) by mouth every 8 (eight) hours as needed for congestion., Disp: 30 tablet, Rfl: 0   rizatriptan  (MAXALT -MLT) 10 MG disintegrating tablet, Take 1 tablet (10 mg total) by mouth as needed for migraine. May repeat in 2 hours if needed, Disp: 90 tablet, Rfl: 1    sertraline  (ZOLOFT ) 100 MG tablet, Take 2 tablets (200 mg total) by mouth daily., Disp: 60 tablet, Rfl: 2   terconazole  (TERAZOL 3 ) 0.8 % vaginal cream, Place 1 applicator vaginally at bedtime., Disp: 20 g, Rfl: 0   triamcinolone  cream (KENALOG ) 0.1 %, Apply 1 application topically 2 (two) times daily., Disp: 30 g, Rfl: 0   valACYclovir (VALTREX) 1000 MG tablet, Take 2,000 mg by mouth 2 (two) times daily., Disp: , Rfl:    metroNIDAZOLE  (FLAGYL ) 500 MG tablet, Take 1 tablet (500 mg total) by mouth 2 (two) times daily for 7 days. (Patient not taking: Reported on 06/13/2023), Disp: 14 tablet, Rfl: 0 Allergies  Allergen Reactions   Peanut-Containing Drug Products     Tree nut specifically    Other     ALL FRUIT     ROS: A  complete ROS was performed with pertinent positives/negatives noted in the HPI. The remainder of the ROS are negative.    Objective:   Today's Vitals   06/13/23 0818  BP: 122/82  Pulse: 80  Temp: 98.3 F (36.8 C)  TempSrc: Temporal  SpO2: 99%  Weight: 198 lb (89.8 kg)  Height: 5' 2 (1.575 m)    GENERAL: Well-appearing, in NAD. Well nourished.  SKIN: Pink, warm and dry. No rash, lesion, ulceration, or ecchymoses.  HEENT:    HEAD: Normocephalic, non-traumatic.  EYES: Conjunctive pink without exudate.  EARS: External ear w/o redness, swelling, masses, or lesions. EAC clear. TM's intact, translucent w/o bulging, appropriate landmarks visualized.  NOSE: Septum midline w/o deformity. Nares patent, mucosa pink and non-inflamed w/o drainage. No sinus tenderness.  THROAT: Uvula midline. Oropharynx clear. Tonsils non-inflamed w/o exudate. Mucus membranes pink and moist.  NECK: Trachea midline. Full ROM w/o pain or tenderness. No lymphadenopathy.  RESPIRATORY: Chest wall symmetrical. Respirations even and non-labored. Breath sounds clear to auscultation bilaterally.  CARDIAC: S1, S2 present, regular rate and rhythm. Peripheral pulses 2+ bilaterally.  MSK: Muscle tone and  strength appropriate for age.  EXTREMITIES: Without clubbing, cyanosis, or edema.  NEUROLOGIC: No motor or sensory deficits. Steady, even gait.  PSYCH/MENTAL STATUS: Alert, oriented x 3. Cooperative, appropriate mood and affect.    No results found for any visits on 06/13/23.    Assessment & Plan:  Assessment and Plan    Tinnitus New onset since Saturday, more noticeable in quiet environments, no associated hearing loss, dizziness, or vision changes. No recent exposure to loud noises or family history of hearing loss. No signs of infection or fluid in the ears. Possible association with elevated blood pressure. -Increase Propranolol  to 20mg  three times a day to manage blood pressure and potentially alleviate tinnitus. -Check blood pressure regularly at home. -If tinnitus persists, refer to ENT.      No orders of the defined types were placed in this encounter.  No orders of the defined types were placed in this encounter.  Lab Orders  No laboratory test(s) ordered today   No images are attached to the encounter or orders placed in the encounter.  Return if symptoms worsen or fail to improve.   Rosina Senters, FNP

## 2023-06-13 NOTE — Patient Instructions (Signed)
 Increase propanolol to 20mg  TID . Keep check on your BP

## 2023-06-18 ENCOUNTER — Encounter (HOSPITAL_COMMUNITY): Payer: Self-pay | Admitting: Emergency Medicine

## 2023-06-20 ENCOUNTER — Ambulatory Visit (INDEPENDENT_AMBULATORY_CARE_PROVIDER_SITE_OTHER): Payer: Commercial Managed Care - HMO | Admitting: Internal Medicine

## 2023-06-20 ENCOUNTER — Encounter: Payer: Self-pay | Admitting: Internal Medicine

## 2023-06-20 VITALS — BP 116/82 | HR 84 | Temp 98.4°F | Ht 62.0 in | Wt 200.8 lb

## 2023-06-20 DIAGNOSIS — Z113 Encounter for screening for infections with a predominantly sexual mode of transmission: Secondary | ICD-10-CM | POA: Diagnosis not present

## 2023-06-20 DIAGNOSIS — A493 Mycoplasma infection, unspecified site: Secondary | ICD-10-CM

## 2023-06-20 DIAGNOSIS — R109 Unspecified abdominal pain: Secondary | ICD-10-CM

## 2023-06-20 DIAGNOSIS — Z7251 High risk heterosexual behavior: Secondary | ICD-10-CM | POA: Diagnosis not present

## 2023-06-20 LAB — POCT PREGNANCY, URINE

## 2023-06-20 NOTE — Progress Notes (Signed)
[facility] [address] [place] [address] Dept: [phone] Dept Fax: [phone]  Acute Care Office Visit  Subjective:   Jamie Lopez [birth year] 06/20/2023  No chief complaint on file.   HPI: Discussed the use of AI scribe software for clinical note transcription with the patient, who gave verbal consent to proceed.  History of Present Illness   The patient, a PA student, presents for STD testing due to recent unprotected sex and recurrent yeast infections. She reports having a yeast infection in early and late December, along with bacterial vaginosis (BV). She was treated for these conditions by her OBGYN and completed the prescribed Flagyl and Terconazole.   She was recently tested for Mycoplasma genitalium due to re-ocurring symptoms. The patient's test results were positive, and she is currently on a 10-day course of Doxycycline 100mg  twice daily from OBGYN. She has informed her sexual partners.  She is currently sexually active with 3 female partners, and uses condom protection with 2 out of the 3.  LMP 2 weeks ago. Last sexual encounter was 1 day ago.   In addition to these concerns, the patient has been experiencing abdominal discomfort when taking her lupus medication. She was referred to a GI specialist in December by her PCP but has not yet been contacted for an appointment.        The following portions of the patient's history were reviewed and updated as appropriate: past medical history, past surgical history, family history, social history, allergies, medications, and problem list.   Patient Active Problem List   Diagnosis Date Noted   Routine general medical examination at a health care facility 05/12/2023   MDD (major depressive disorder), recurrent episode, moderate (HCC) 02/21/2023   Insomnia due to other mental disorder 02/21/2023   Chest pain 10/06/2022   Lymphadenopathy 10/06/2022   Iron  deficiency anemia due to chronic blood loss 04/29/2022   Dizziness 04/14/2022   Vitamin D deficiency 04/14/2022   Gastroesophageal reflux disease 01/28/2022   Chronic fatigue 01/28/2022   Systemic lupus erythematosus (HCC) 07/16/2020   GAD (generalized anxiety disorder) 10/25/2012   Migraine 05/28/2011   Past Medical History:  Diagnosis Date   Allergy 04/1999   Seasonal allergies   Anxiety    Asthma    Depression 01/2013   GERD (gastroesophageal reflux disease) 07/2020   Headache(784.0)    Lupus    Rosacea    Seasonal allergies    Past Surgical History:  Procedure Laterality Date   WISDOM TOOTH EXTRACTION     Family History  Problem Relation Age of Onset   Anxiety disorder Mother    Diabetes Mellitus II Mother    Depression Mother    Cancer Mother        ovarian   Diabetes Mother    Miscarriages / [place] Mother    Drug abuse Father    Asthma Father    Early death Father    Anxiety disorder Maternal Grandmother    Diabetes Mellitus II Maternal Grandmother    Depression Maternal Grandmother    Hypertension Maternal Grandmother    Anxiety disorder Maternal Grandfather    Depression Maternal Grandfather    Alcohol abuse Maternal Grandfather    Drug abuse Maternal Grandfather    Arthritis Maternal Grandfather    Alcohol abuse Paternal Grandfather    Drug abuse Paternal Grandfather    Bipolar disorder Cousin    Schizophrenia Cousin     Current Outpatient Medications:    albuterol (  VENTOLIN HFA) 108 (90 Base) MCG/ACT inhaler, Inhale 2 puffs into the lungs every 6 (six) hours as needed for wheezing or shortness of breath., Disp: 8 g, Rfl: 6   budesonide (RHINOCORT ALLERGY) 32 MCG/ACT nasal spray, Place 1-2 sprays into both nostrils daily., Disp: 8.6 g, Rfl: 6   buPROPion (WELLBUTRIN XL) 150 MG 24 hr tablet, Take 1 tablet (150 mg total) by mouth daily., Disp: 30 tablet, Rfl: 2   buPROPion (WELLBUTRIN XL) 300 MG 24 hr tablet, Take 1 tablet (300 mg total) by mouth  every morning., Disp: 30 tablet, Rfl: 2   busPIRone (BUSPAR) 15 MG tablet, Take 1 tablet (15 mg total) by mouth daily., Disp: 30 tablet, Rfl: 2   cetirizine (ZYRTEC ALLERGY) 10 MG tablet, Take 1 tablet (10 mg total) by mouth daily., Disp: 30 tablet, Rfl: 0   diclofenac (VOLTAREN) 75 MG EC tablet, Take 75 mg by mouth 2 (two) times daily., Disp: , Rfl:    doxycycline (VIBRAMYCIN) 100 MG capsule, Take 100 mg by mouth 2 (two) times daily., Disp: , Rfl:    etonogestrel-ethinyl estradiol (NUVARING) 0.12-0.015 MG/24HR vaginal ring, Place 1 each vaginally every 28 (twenty-eight) days. Insert vaginally and leave in place for 3 consecutive weeks, then remove for 1 week., Disp: , Rfl:    hydroxychloroquine (PLAQUENIL) 200 MG tablet, Take 200 mg by mouth 2 (two) times daily., Disp: , Rfl:    ibuprofen (ADVIL) 600 MG tablet, Take 1 tablet (600 mg total) by mouth every 6 (six) hours as needed., Disp: 30 tablet, Rfl: 0   meloxicam (MOBIC) 15 MG tablet, Take 15 mg by mouth daily as needed for pain., Disp: , Rfl:    naproxen (NAPROSYN) 500 MG tablet, Take 1 tablet (500 mg total) by mouth 2 (two) times daily., Disp: 30 tablet, Rfl: 0   Olopatadine HCl (PATADAY) 0.2 % SOLN, Apply 1 drop to eye daily as needed., Disp: 2.5 mL, Rfl: 3   omeprazole (PRILOSEC) 40 MG capsule, TAKE 1 CAPSULE (40 MG TOTAL) BY MOUTH DAILY., Disp: 90 capsule, Rfl: 1   ondansetron (ZOFRAN-ODT) 4 MG disintegrating tablet, Take 1 tablet (4 mg total) by mouth as needed., Disp: 30 tablet, Rfl: 1   propranolol (INDERAL) 10 MG tablet, Take 2 tablets (20 mg total) by mouth 3 (three) times daily., Disp: , Rfl:    pseudoephedrine (SUDAFED) 60 MG tablet, Take 1 tablet (60 mg total) by mouth every 8 (eight) hours as needed for congestion., Disp: 30 tablet, Rfl: 0   rizatriptan (MAXALT-MLT) 10 MG disintegrating tablet, Take 1 tablet (10 mg total) by mouth as needed for migraine. May repeat in 2 hours if needed, Disp: 90 tablet, Rfl: 1   sertraline (ZOLOFT)  100 MG tablet, Take 2 tablets (200 mg total) by mouth daily., Disp: 60 tablet, Rfl: 2   triamcinolone cream (KENALOG) 0.1 %, Apply 1 application topically 2 (two) times daily., Disp: 30 g, Rfl: 0   valACYclovir (VALTREX) 1000 MG tablet, Take 2,000 mg by mouth 2 (two) times daily., Disp: , Rfl:  Allergies  Allergen Reactions   Peanut-Containing Drug Products     Tree nut specifically    Other     ALL FRUIT     ROS: A complete ROS was performed with pertinent positives/negatives noted in the HPI. The remainder of the ROS are negative.    Objective:   Today's Vitals   06/20/23 1028  BP: 116/82  Pulse: 84  Temp: 98.4 F (36.9 C)  TempSrc: Temporal  SpO2: 99%  Weight: 200 lb 12.8 oz (91.1 kg)  Height: 5\' 2"  (1.575 m)    GENERAL: Well-appearing, in NAD. Well nourished.  SKIN: Pink, warm and dry.  RESPIRATORY: Respirations even and non-labored.   NEUROLOGIC: Steady, even gait.  PSYCH/MENTAL STATUS: Alert, oriented x 3. Cooperative, appropriate mood and affect.    Results for orders placed or performed in visit on 06/20/23  POCT Pregnancy, Urine  Result Value Ref Range   Negative        Assessment & Plan:  Assessment and Plan    Mycoplasma Genitalium Infection   Positive test result, currently on Doxycycline 100mg  twice daily for 10 days.   -Continue current treatment regimen.   -Advised patient to inform sexual partners of diagnosis and recommend they seek testing and treatment.    Sexually Transmitted Disease Screening   Recent sexual activity with multiple partners, some unprotected.   -Order blood tests for syphilis, HIV, and herpes.   -Perform urine pregnancy test.    Abdominal Discomfort   History of discomfort when taking lupus medications and sometimes when eating.   -Referral to [facility] already placed by another provider. Patient advised to follow up with GI specialist.  - info provided on d/c summary      Orders Placed This Encounter  Procedures    RPR   HIV antibody (with reflex)   HSV(herpes simplex vrs) 1+2 ab-IgG   Lab Orders         RPR         HIV antibody (with reflex)         HSV(herpes simplex vrs) 1+2 ab-IgG     No images are attached to the encounter or orders placed in the encounter.  Return if symptoms worsen or fail to improve.   Salvatore Decent, FNP

## 2023-06-20 NOTE — Patient Instructions (Signed)
[facility]: Address: [address], [place], [place] [address] Phone: [phone]

## 2023-06-22 ENCOUNTER — Encounter: Payer: Self-pay | Admitting: Internal Medicine

## 2023-06-22 DIAGNOSIS — H9313 Tinnitus, bilateral: Secondary | ICD-10-CM

## 2023-06-22 LAB — HIV ANTIBODY (ROUTINE TESTING W REFLEX): HIV 1&2 Ab, 4th Generation: NONREACTIVE

## 2023-06-22 LAB — RPR: RPR Ser Ql: NONREACTIVE

## 2023-06-22 LAB — HSV(HERPES SIMPLEX VRS) I + II AB-IGG
HSV 1 IGG,TYPE SPECIFIC AB: 39.5 {index} — ABNORMAL HIGH
HSV 2 IGG,TYPE SPECIFIC AB: <0.9 {index}

## 2023-07-06 ENCOUNTER — Telehealth (INDEPENDENT_AMBULATORY_CARE_PROVIDER_SITE_OTHER): Payer: Commercial Managed Care - HMO | Admitting: Nurse Practitioner

## 2023-07-06 ENCOUNTER — Encounter: Payer: Self-pay | Admitting: Nurse Practitioner

## 2023-07-06 DIAGNOSIS — H9313 Tinnitus, bilateral: Secondary | ICD-10-CM | POA: Diagnosis not present

## 2023-07-06 DIAGNOSIS — F411 Generalized anxiety disorder: Secondary | ICD-10-CM

## 2023-07-06 NOTE — Patient Instructions (Signed)
 It was great to see you!  I am sending the accommodations to your mychart.  You can try lipo flavonoid to see if this helps with the ear ringing.   Keep your appointment with ENT in April.   Let's follow-up if symptoms worsen or any concerns.   Take care,  Tinnie Harada, NP

## 2023-07-06 NOTE — Assessment & Plan Note (Signed)
 Chronic, ongoing.  Her anxiety for the most part is well-controlled with Wellbutrin  450 mg daily, BuSpar  15 mg daily, and sertraline  200 mg daily.  She is currently following with psychiatry.  She is having some from what test anxiety at school.  Will write an accommodation to allow time and a half for exams.

## 2023-07-06 NOTE — Assessment & Plan Note (Signed)
 She has been having ongoing tinnitus for 3 weeks bilaterally.  She denies any decrease in hearing.  Last visit a few weeks ago, showed no impacted cerumen.  She has been using a noise machine at night.  She can try Lipo flavonoid supplement to see if this helps.  Keep appointment with ENT in April.  Will write an accommodation for her school for testing to be in a separate room with background noise.

## 2023-07-06 NOTE — Progress Notes (Signed)
 [facility] [address] [place] [address] Dept: [phone] Dept Fax: [phone]  Virtual Video Visit  I connected with Jamie Lopez on 07/06/23 at 10:00 AM EST by a video enabled telemedicine application and verified that I am speaking with the correct person using two identifiers.  Location patient: Home Location provider: Clinic Persons participating in the virtual visit: Patient; Jamie Harada, NP; Jamie Lopez, CMA  I discussed the limitations of evaluation and management by telemedicine and the availability of in person appointments. The patient expressed understanding and agreed to proceed.  Chief Complaint  Patient presents with   OFFICE VISIT     PT C/O of tinnitus; she is requesting note or letter stating issue is being treated and does affect work and school.     SUBJECTIVE:  HPI: Jamie Lopez is a 28 y.o. female who presents with tinnitus.   Jamie Lopez is here to follow-up on bilateral tinnitus.  She states that has been ongoing for 3 weeks now and is interfering with her everyday life.  She has an appointment with ENT in April.  She states that at night, she has been using a sound machine to help her sleep.  Quiet rooms make the tinnitus worse.  She denies any trouble hearing.  She was seen in person a few weeks ago and did not have any impacted cerumen.  She notes that this is interfering with her school when she takes exams.  She states the room is extremely quiet and it caused her to have hard time concentrating on her exam.  She is asking for accommodations to be in a separate room with some background noise.  She also notes that she is having some test anxiety.  This was going on prior in her schooling, however her exam time for now shorter.  She feels like she needs a little bit more time on her exams due to anxiety.  She is still taking her Wellbutrin , BuSpar , propranolol , and sertraline .  She is  also still following with psychiatry.  Patient Active Problem List   Diagnosis Date Noted   Tinnitus of both ears 07/06/2023   Routine general medical examination at a health care facility 05/12/2023   MDD (major depressive disorder), recurrent episode, moderate (HCC) 02/21/2023   Insomnia due to other mental disorder 02/21/2023   Chest pain 10/06/2022   Lymphadenopathy 10/06/2022   Iron deficiency anemia due to chronic blood loss 04/29/2022   Dizziness 04/14/2022   Vitamin D  deficiency 04/14/2022   Gastroesophageal reflux disease 01/28/2022   Chronic fatigue 01/28/2022   Systemic lupus erythematosus (HCC) 07/16/2020   GAD (generalized anxiety disorder) 10/25/2012   Migraine 05/28/2011    Past Surgical History:  Procedure Laterality Date   WISDOM TOOTH EXTRACTION      Family History  Problem Relation Age of Onset   Anxiety disorder Mother    Diabetes Mellitus II Mother    Depression Mother    Cancer Mother        ovarian   Diabetes Mother    Miscarriages / Stillbirths Mother    Drug abuse Father    Asthma Father    Early death Father    Anxiety disorder Maternal Grandmother    Diabetes Mellitus II Maternal Grandmother    Depression Maternal Grandmother    Hypertension Maternal Grandmother    Anxiety disorder Maternal Grandfather    Depression Maternal Grandfather    Alcohol abuse Maternal Grandfather    Drug abuse Maternal  Grandfather    Arthritis Maternal Grandfather    Alcohol abuse Paternal Grandfather    Drug abuse Paternal Grandfather    Bipolar disorder Cousin    Schizophrenia Cousin     Social History   Tobacco Use   Smoking status: Former    Current packs/day: 0.00    Types: Cigarettes, Cigars    Quit date: 2022    Years since quitting: 3.1   Smokeless tobacco: Never   Tobacco comments:    once a year or less when very stressed.  Vaping Use   Vaping status: Never Used  Substance Use Topics   Alcohol use: Yes    Alcohol/week: 2.0 standard  drinks of alcohol    Types: 2 Glasses of wine per week    Comment: social: 1-2x/week   Drug use: Not Currently    Types: Marijuana    Comment: quit in 2018     Current Outpatient Medications:    albuterol  (VENTOLIN  HFA) 108 (90 Base) MCG/ACT inhaler, Inhale 2 puffs into the lungs every 6 (six) hours as needed for wheezing or shortness of breath., Disp: 8 g, Rfl: 6   budesonide  (RHINOCORT  ALLERGY) 32 MCG/ACT nasal spray, Place 1-2 sprays into both nostrils daily., Disp: 8.6 g, Rfl: 6   buPROPion  (WELLBUTRIN  XL) 150 MG 24 hr tablet, Take 1 tablet (150 mg total) by mouth daily., Disp: 30 tablet, Rfl: 2   buPROPion  (WELLBUTRIN  XL) 300 MG 24 hr tablet, Take 1 tablet (300 mg total) by mouth every morning., Disp: 30 tablet, Rfl: 2   busPIRone  (BUSPAR ) 15 MG tablet, Take 1 tablet (15 mg total) by mouth daily., Disp: 30 tablet, Rfl: 2   cetirizine  (ZYRTEC  ALLERGY) 10 MG tablet, Take 1 tablet (10 mg total) by mouth daily., Disp: 30 tablet, Rfl: 0   diclofenac (VOLTAREN) 75 MG EC tablet, Take 75 mg by mouth 2 (two) times daily., Disp: , Rfl:    doxycycline (VIBRAMYCIN) 100 MG capsule, Take 100 mg by mouth 2 (two) times daily., Disp: , Rfl:    etonogestrel-ethinyl estradiol (NUVARING) 0.12-0.015 MG/24HR vaginal ring, Place 1 each vaginally every 28 (twenty-eight) days. Insert vaginally and leave in place for 3 consecutive weeks, then remove for 1 week., Disp: , Rfl:    hydroxychloroquine (PLAQUENIL) 200 MG tablet, Take 200 mg by mouth 2 (two) times daily., Disp: , Rfl:    ibuprofen  (ADVIL ) 600 MG tablet, Take 1 tablet (600 mg total) by mouth every 6 (six) hours as needed., Disp: 30 tablet, Rfl: 0   meloxicam (MOBIC) 15 MG tablet, Take 15 mg by mouth daily as needed for pain., Disp: , Rfl:    naproxen  (NAPROSYN ) 500 MG tablet, Take 1 tablet (500 mg total) by mouth 2 (two) times daily., Disp: 30 tablet, Rfl: 0   Olopatadine  HCl (PATADAY ) 0.2 % SOLN, Apply 1 drop to eye daily as needed., Disp: 2.5 mL, Rfl:  3   omeprazole  (PRILOSEC) 40 MG capsule, TAKE 1 CAPSULE (40 MG TOTAL) BY MOUTH DAILY., Disp: 90 capsule, Rfl: 1   ondansetron  (ZOFRAN -ODT) 4 MG disintegrating tablet, Take 1 tablet (4 mg total) by mouth as needed., Disp: 30 tablet, Rfl: 1   propranolol  (INDERAL ) 10 MG tablet, Take 2 tablets (20 mg total) by mouth 3 (three) times daily., Disp: , Rfl:    pseudoephedrine  (SUDAFED) 60 MG tablet, Take 1 tablet (60 mg total) by mouth every 8 (eight) hours as needed for congestion., Disp: 30 tablet, Rfl: 0   rizatriptan  (MAXALT -MLT) 10 MG disintegrating  tablet, Take 1 tablet (10 mg total) by mouth as needed for migraine. May repeat in 2 hours if needed, Disp: 90 tablet, Rfl: 1   sertraline  (ZOLOFT ) 100 MG tablet, Take 2 tablets (200 mg total) by mouth daily., Disp: 60 tablet, Rfl: 2   triamcinolone  cream (KENALOG ) 0.1 %, Apply 1 application topically 2 (two) times daily., Disp: 30 g, Rfl: 0   valACYclovir (VALTREX) 1000 MG tablet, Take 2,000 mg by mouth 2 (two) times daily., Disp: , Rfl:   Allergies  Allergen Reactions   Peanut-Containing Drug Products     Tree nut specifically    Other     ALL FRUIT    ROS: See pertinent positives and negatives per HPI.  OBSERVATIONS/OBJECTIVE:  VITALS per patient if applicable: There were no vitals filed for this visit. There is no height or weight on file to calculate BMI.    GENERAL: Alert and oriented. Appears well and in no acute distress.  HEENT: Atraumatic. Conjunctiva clear. No obvious abnormalities on inspection of external nose and ears.  NECK: Normal movements of the head and neck.  LUNGS: On inspection, no signs of respiratory distress. Breathing rate appears normal. No obvious gross SOB, gasping or wheezing, and no conversational dyspnea.  CV: No obvious cyanosis.  MS: Moves all visible extremities without noticeable abnormality.  PSYCH/NEURO: Pleasant and cooperative. No obvious depression or anxiety. Speech and thought processing  grossly intact.  ASSESSMENT AND PLAN:  Problem List Items Addressed This Visit       Other   GAD (generalized anxiety disorder)   Chronic, ongoing.  Her anxiety for the most part is well-controlled with Wellbutrin  450 mg daily, BuSpar  15 mg daily, and sertraline  200 mg daily.  She is currently following with psychiatry.  She is having some from what test anxiety at school.  Will write an accommodation to allow time and a half for exams.      Tinnitus of both ears - Primary   She has been having ongoing tinnitus for 3 weeks bilaterally.  She denies any decrease in hearing.  Last visit a few weeks ago, showed no impacted cerumen.  She has been using a noise machine at night.  She can try Lipo flavonoid supplement to see if this helps.  Keep appointment with ENT in April.  Will write an accommodation for her school for testing to be in a separate room with background noise.        I discussed the assessment and treatment plan with the patient. The patient was provided an opportunity to ask questions and all were answered. The patient agreed with the plan and demonstrated an understanding of the instructions.   The patient was advised to call back or seek an in-person evaluation if the symptoms worsen or if the condition fails to improve as anticipated.   Jamie DELENA Harada, NP

## 2023-07-07 ENCOUNTER — Encounter: Payer: Self-pay | Admitting: Nurse Practitioner

## 2023-07-07 DIAGNOSIS — K219 Gastro-esophageal reflux disease without esophagitis: Secondary | ICD-10-CM

## 2023-07-07 NOTE — Telephone Encounter (Signed)
 Patient had a video visit yesterday with you and message about a GI referral I did not see one in her chart. I did see ENT.

## 2023-07-16 ENCOUNTER — Other Ambulatory Visit: Payer: Self-pay | Admitting: Nurse Practitioner

## 2023-07-16 DIAGNOSIS — R110 Nausea: Secondary | ICD-10-CM

## 2023-07-16 DIAGNOSIS — F411 Generalized anxiety disorder: Secondary | ICD-10-CM

## 2023-08-04 ENCOUNTER — Encounter: Payer: Self-pay | Admitting: Physician Assistant

## 2023-08-14 ENCOUNTER — Encounter: Payer: Self-pay | Admitting: Nurse Practitioner

## 2023-08-14 DIAGNOSIS — J309 Allergic rhinitis, unspecified: Secondary | ICD-10-CM

## 2023-08-14 NOTE — Telephone Encounter (Signed)
 Ventolin HFA, pataday eye drops, oral Zyrtec!    Requesting: Ventolin, Pataday Eye Drops, Zyrtec Last Visit: 05/12/2023 Next Visit: Visit date not found Last Refill: 04/29/22, 09/09/22, 06/28/22 by Wallis Bamberg, PAC,   Please Advise

## 2023-08-15 MED ORDER — OLOPATADINE HCL 0.2 % OP SOLN: 1.0000 [drp] | Freq: Every day | OPHTHALMIC | 3 refills | Status: AC | PRN

## 2023-08-15 MED ORDER — ALBUTEROL SULFATE HFA 108 (90 BASE) MCG/ACT IN AERS: 2.0000 | INHALATION_SPRAY | Freq: Four times a day (QID) | RESPIRATORY_TRACT | 6 refills | Status: AC | PRN

## 2023-08-15 MED ORDER — CETIRIZINE HCL 10 MG PO TABS: 10.0000 mg | ORAL_TABLET | Freq: Every day | ORAL | 3 refills | Status: DC

## 2023-08-15 NOTE — Addendum Note (Signed)
 Addended by: Rodman Pickle A on: 08/15/2023 08:22 AM   Modules accepted: Orders

## 2023-08-31 ENCOUNTER — Ambulatory Visit (INDEPENDENT_AMBULATORY_CARE_PROVIDER_SITE_OTHER): Payer: Commercial Managed Care - HMO | Admitting: Otolaryngology

## 2023-08-31 VITALS — BP 137/85 | HR 88 | Ht 62.0 in | Wt 200.0 lb

## 2023-08-31 DIAGNOSIS — H9313 Tinnitus, bilateral: Secondary | ICD-10-CM | POA: Diagnosis not present

## 2023-09-02 NOTE — Progress Notes (Signed)
 Patient ID: Jamie Lopez, female   DOB: 1996-02-19, 28 y.o.   MRN: [id number]  CC: Bilateral tinnitus  HPI:  Jamie Lopez is a 28 y.o. female who presents today complaining of bilateral tinnitus for the past 3 months.  According to the patient, initially the tinnitus was a constant high-pitched ringing noise.  Currently the tinnitus is intermittent.  It is nonpulsatile.  The patient denies any hearing difficulty in either ear.  She has a history of recurrent childhood otitis media.  However, she has no previous ENT surgery.  She has a history of lupus, and is currently on Plaquenil.  Past Medical History:  Diagnosis Date   Allergy 04/1999   Seasonal allergies   Anxiety    Asthma    Depression 01/2013   GERD (gastroesophageal reflux disease) 07/2020   Headache(784.0)    Lupus    Rosacea    Seasonal allergies     Past Surgical History:  Procedure Laterality Date   WISDOM TOOTH EXTRACTION      Family History  Problem Relation Age of Onset   Anxiety disorder Mother    Diabetes Mellitus II Mother    Depression Mother    Cancer Mother        ovarian   Diabetes Mother    Miscarriages / [place] Mother    Drug abuse Father    Asthma Father    Early death Father    Anxiety disorder Maternal Grandmother    Diabetes Mellitus II Maternal Grandmother    Depression Maternal Grandmother    Hypertension Maternal Grandmother    Anxiety disorder Maternal Grandfather    Depression Maternal Grandfather    Alcohol abuse Maternal Grandfather    Drug abuse Maternal Grandfather    Arthritis Maternal Grandfather    Alcohol abuse Paternal Grandfather    Drug abuse Paternal Grandfather    Bipolar disorder Cousin    Schizophrenia Cousin     Social History:  reports that she quit smoking about 3 years ago. Her smoking use included cigarettes and cigars. She has never used smokeless tobacco. She reports current alcohol use of about 2.0 standard drinks of alcohol per week. She reports  that she does not currently use drugs after having used the following drugs: Marijuana.  Allergies:  Allergies  Allergen Reactions   Peanut-Containing Drug Products     Tree nut specifically    Other     ALL FRUIT    Prior to Admission medications   Medication Sig Start Date End Date Taking? Authorizing Provider  albuterol (VENTOLIN HFA) 108 (90 Base) MCG/ACT inhaler Inhale 2 puffs into the lungs every 6 (six) hours as needed for wheezing or shortness of breath. 08/15/23  Yes McElwee, Lauren A, NP  budesonide (RHINOCORT ALLERGY) 32 MCG/ACT nasal spray Place 1-2 sprays into both nostrils daily. 08/25/18  Yes Shade Flood, MD  buPROPion (WELLBUTRIN XL) 150 MG 24 hr tablet Take 1 tablet (150 mg total) by mouth daily. 04/05/23  Yes Carlyn Reichert, MD  buPROPion (WELLBUTRIN XL) 300 MG 24 hr tablet Take 1 tablet (300 mg total) by mouth every morning. 04/05/23 04/04/24 Yes Carlyn Reichert, MD  busPIRone (BUSPAR) 15 MG tablet Take 1 tablet (15 mg total) by mouth daily. 04/05/23  Yes Carlyn Reichert, MD  etonogestrel-ethinyl estradiol (NUVARING) 0.12-0.015 MG/24HR vaginal ring Place 1 each vaginally every 28 (twenty-eight) days. Insert vaginally and leave in place for 3 consecutive weeks, then remove for 1 week.   Yes [provider]  hydroxychloroquine (PLAQUENIL)  200 MG tablet Take 200 mg by mouth 2 (two) times daily. 06/30/20  Yes [provider]  hydrOXYzine (VISTARIL) 25 MG capsule Take 25-50 mg by mouth at bedtime as needed. 08/13/23  Yes [provider]  ibuprofen (ADVIL) 600 MG tablet Take 1 tablet (600 mg total) by mouth every 6 (six) hours as needed. 06/28/22  Yes Wallis Bamberg, PA-C  meloxicam (MOBIC) 15 MG tablet Take 15 mg by mouth daily as needed for pain.   Yes [provider]  naproxen (NAPROSYN) 500 MG tablet Take 1 tablet (500 mg total) by mouth 2 (two) times daily. 06/01/18  Yes Kendrick, Caitlyn S, PA-C  Olopatadine HCl (PATADAY) 0.2 % SOLN Apply 1 drop  to eye daily as needed. 08/15/23  Yes McElwee, Lauren A, NP  omeprazole (PRILOSEC) 40 MG capsule TAKE 1 CAPSULE (40 MG TOTAL) BY MOUTH DAILY. 06/09/23  Yes McElwee, Lauren A, NP  ondansetron (ZOFRAN-ODT) 4 MG disintegrating tablet TAKE 1 TABLET (4 MG TOTAL) BY MOUTH AS NEEDED.MAX 16MG  PER DAY 07/17/23  Yes McElwee, Lauren A, NP  propranolol (INDERAL) 10 MG tablet TAKE 1 TABLET BY MOUTH THREE TIMES A DAY 07/17/23  Yes McElwee, Lauren A, NP  pseudoephedrine (SUDAFED) 60 MG tablet Take 1 tablet (60 mg total) by mouth every 8 (eight) hours as needed for congestion. 06/28/22  Yes Wallis Bamberg, PA-C  rizatriptan (MAXALT-MLT) 10 MG disintegrating tablet Take 1 tablet (10 mg total) by mouth as needed for migraine. May repeat in 2 hours if needed 01/29/21  Yes Janeece Agee, NP  sertraline (ZOLOFT) 100 MG tablet Take 2 tablets (200 mg total) by mouth daily. 04/05/23  Yes Carlyn Reichert, MD  triamcinolone cream (KENALOG) 0.1 % Apply 1 application topically 2 (two) times daily. 01/02/19  Yes Janeece Agee, NP  valACYclovir (VALTREX) 1000 MG tablet Take 2,000 mg by mouth 2 (two) times daily.   Yes [provider]  cetirizine (ZYRTEC ALLERGY) 10 MG tablet Take 1 tablet (10 mg total) by mouth daily. Patient not taking: Reported on 08/31/2023 08/15/23   Gerre Scull, NP  diclofenac (VOLTAREN) 75 MG EC tablet Take 75 mg by mouth 2 (two) times daily.    [provider]  doxycycline (VIBRAMYCIN) 100 MG capsule Take 100 mg by mouth 2 (two) times daily. 06/19/23   [provider]    Blood pressure 137/85, pulse 88, height 5\' 2"  (1.575 m), weight 200 lb (90.7 kg), SpO2 98%. Exam: General: Communicates without difficulty, well nourished, no acute distress. Head: Normocephalic, no evidence injury, no tenderness, facial buttresses intact without stepoff. Face/sinus: No tenderness to palpation and percussion. Facial movement is normal and symmetric. Eyes: PERRL, EOMI. No scleral icterus, conjunctivae  clear. Neuro: CN II exam reveals vision grossly intact.  No nystagmus at any point of gaze. Ears: Auricles well formed without lesions.  Ear canals are intact without mass or lesion.  No erythema or edema is appreciated.  The TMs are intact without fluid. Nose: External evaluation reveals normal support and skin without lesions.  Dorsum is intact.  Anterior rhinoscopy reveals normal mucosa over anterior aspect of inferior turbinates and intact septum.  No purulence noted. Oral:  Oral cavity and oropharynx are intact, symmetric, without erythema or edema.  Mucosa is moist without lesions. Neck: Full range of motion without pain.  There is no significant lymphadenopathy.  No masses palpable.  Thyroid bed within normal limits to palpation.  Parotid glands and submandibular glands equal bilaterally without mass.  Trachea is midline.  Neuro:  CN 2-12 grossly intact.   Assessment: 1.  Bilateral subjective tinnitus. 2.  Her ear canals, tympanic membranes, and middle ear spaces are normal. 3.  The patient denies any subjective hearing loss.  No audiologist is available for hearing test today.  Plan: 1.  The physical exam findings are reviewed with the patient. 2.  The pathophysiology of tinnitus is discussed with the patient. 3.  The strategies to cope with tinnitus, including the use of masker, hearing aids, tinnitus retraining therapy, and avoidance of caffeine and alcohol are discussed.  4.  The patient will return for reevaluation and hearing test if her tinnitus persists or worsens.  Garion Wempe W Shaqueena Mauceri 09/02/2023, 6:04 PM

## 2023-09-07 ENCOUNTER — Telehealth: Payer: Self-pay

## 2023-09-07 DIAGNOSIS — M329 Systemic lupus erythematosus, unspecified: Secondary | ICD-10-CM

## 2023-09-07 NOTE — Telephone Encounter (Signed)
 Copied from CRM [id number]. Topic: Referral - Question >> Sep 07, 2023 10:48 AM Fredrich Romans wrote: Reason for CRM: Patient would like to know if she could switch rheumatologist?She has been seeing one for a little while now and stated that  She said that her new one is just needing a referral sent to them [facility] [place] Fax #:[phone]

## 2023-09-07 NOTE — Telephone Encounter (Signed)
 Forwarding message below.   LOV was on 05/12/2023

## 2023-09-08 NOTE — Telephone Encounter (Signed)
 I called patient and left her a detailed message that a referral has been placed per her request.

## 2023-09-12 ENCOUNTER — Encounter: Payer: Self-pay | Admitting: Internal Medicine

## 2023-09-12 ENCOUNTER — Ambulatory Visit (INDEPENDENT_AMBULATORY_CARE_PROVIDER_SITE_OTHER): Admitting: Internal Medicine

## 2023-09-12 VITALS — BP 120/84 | HR 91 | Temp 97.8°F | Ht 62.0 in | Wt 202.6 lb

## 2023-09-12 DIAGNOSIS — M329 Systemic lupus erythematosus, unspecified: Secondary | ICD-10-CM | POA: Diagnosis not present

## 2023-09-12 MED ORDER — PREDNISONE 10 MG (21) PO TBPK: ORAL_TABLET | ORAL | 0 refills | Status: DC

## 2023-09-12 NOTE — Progress Notes (Signed)
 [facility] [address] [place] [address] Dept: [phone] Dept Fax: [phone]  Acute Care Office Visit  Subjective:   Jamie Lopez [birth year] 09/12/2023  Chief Complaint  Patient presents with   Extremity Weakness    HPI: Discussed the use of AI scribe software for clinical note transcription with the patient, who gave verbal consent to proceed.  History of Present Illness   The patient, with a history of lupus, presents with a two-week history of generalized malaise, fatigue, and a sensation of swelling. She denies any specific pain, but reports intermittent muscle aches. She recently attended a music festival and despite using sunscreen, she developed a lupus rash. She has been compliant with her medications, which include hydroxychloroquine 200mg  twice daily and meloxicam for pain.  She is in the process of changing rheumatologists due to dissatisfaction with her current provider. She reports that her ESR and CRP levels are markedly elevated, almost double her usual levels, indicating a lupus flare. She also reports a persistent lupus rash, which is slowly fading with avoidance of sun exposure.       The following portions of the patient's history were reviewed and updated as appropriate: past medical history, past surgical history, family history, social history, allergies, medications, and problem list.   Patient Active Problem List   Diagnosis Date Noted   Tinnitus of both ears 07/06/2023   Routine general medical examination at a health care facility 05/12/2023   MDD (major depressive disorder), recurrent episode, moderate (HCC) 02/21/2023   Insomnia due to other mental disorder 02/21/2023   Chest pain 10/06/2022   Lymphadenopathy 10/06/2022   Iron deficiency anemia due to chronic blood loss 04/29/2022   Dizziness 04/14/2022   Vitamin D deficiency 04/14/2022   Gastroesophageal reflux disease 01/28/2022    Chronic fatigue 01/28/2022   Systemic lupus erythematosus (HCC) 07/16/2020   GAD (generalized anxiety disorder) 10/25/2012   Migraine 05/28/2011   Past Medical History:  Diagnosis Date   Allergy 04/1999   Seasonal allergies   Anxiety    Asthma    Depression 01/2013   GERD (gastroesophageal reflux disease) 07/2020   Headache(784.0)    Lupus    Rosacea    Seasonal allergies    Past Surgical History:  Procedure Laterality Date   WISDOM TOOTH EXTRACTION     Family History  Problem Relation Age of Onset   Anxiety disorder Mother    Diabetes Mellitus II Mother    Depression Mother    Cancer Mother        ovarian   Diabetes Mother    Miscarriages / [place] Mother    Drug abuse Father    Asthma Father    Early death Father    Anxiety disorder Maternal Grandmother    Diabetes Mellitus II Maternal Grandmother    Depression Maternal Grandmother    Hypertension Maternal Grandmother    Anxiety disorder Maternal Grandfather    Depression Maternal Grandfather    Alcohol abuse Maternal Grandfather    Drug abuse Maternal Grandfather    Arthritis Maternal Grandfather    Alcohol abuse Paternal Grandfather    Drug abuse Paternal Grandfather    Bipolar disorder Cousin    Schizophrenia Cousin     Current Outpatient Medications:    albuterol (VENTOLIN HFA) 108 (90 Base) MCG/ACT inhaler, Inhale 2 puffs into the lungs every 6 (six) hours as needed for wheezing or shortness of breath., Disp: 8 g, Rfl: 6   budesonide (RHINOCORT ALLERGY)  32 MCG/ACT nasal spray, Place 1-2 sprays into both nostrils daily., Disp: 8.6 g, Rfl: 6   buPROPion (WELLBUTRIN XL) 150 MG 24 hr tablet, Take 1 tablet (150 mg total) by mouth daily., Disp: 30 tablet, Rfl: 2   buPROPion (WELLBUTRIN XL) 300 MG 24 hr tablet, Take 1 tablet (300 mg total) by mouth every morning., Disp: 30 tablet, Rfl: 2   busPIRone (BUSPAR) 15 MG tablet, Take 1 tablet (15 mg total) by mouth daily., Disp: 30 tablet, Rfl: 2   cetirizine  (ZYRTEC ALLERGY) 10 MG tablet, Take 1 tablet (10 mg total) by mouth daily., Disp: 90 tablet, Rfl: 3   diclofenac (VOLTAREN) 75 MG EC tablet, Take 75 mg by mouth 2 (two) times daily., Disp: , Rfl:    etonogestrel-ethinyl estradiol (NUVARING) 0.12-0.015 MG/24HR vaginal ring, Place 1 each vaginally every 28 (twenty-eight) days. Insert vaginally and leave in place for 3 consecutive weeks, then remove for 1 week., Disp: , Rfl:    hydroxychloroquine (PLAQUENIL) 200 MG tablet, Take 200 mg by mouth 2 (two) times daily., Disp: , Rfl:    hydrOXYzine (VISTARIL) 25 MG capsule, Take 25-50 mg by mouth at bedtime as needed., Disp: , Rfl:    ibuprofen (ADVIL) 600 MG tablet, Take 1 tablet (600 mg total) by mouth every 6 (six) hours as needed., Disp: 30 tablet, Rfl: 0   meloxicam (MOBIC) 15 MG tablet, Take 15 mg by mouth daily as needed for pain., Disp: , Rfl:    naproxen (NAPROSYN) 500 MG tablet, Take 1 tablet (500 mg total) by mouth 2 (two) times daily., Disp: 30 tablet, Rfl: 0   Olopatadine HCl (PATADAY) 0.2 % SOLN, Apply 1 drop to eye daily as needed., Disp: 2.5 mL, Rfl: 3   omeprazole (PRILOSEC) 40 MG capsule, TAKE 1 CAPSULE (40 MG TOTAL) BY MOUTH DAILY., Disp: 90 capsule, Rfl: 1   ondansetron (ZOFRAN-ODT) 4 MG disintegrating tablet, TAKE 1 TABLET (4 MG TOTAL) BY MOUTH AS NEEDED.MAX 16MG  PER DAY, Disp: 30 tablet, Rfl: 1   predniSONE (STERAPRED UNI-PAK 21 TAB) 10 MG (21) TBPK tablet, Take as directed on packaging., Disp: 21 tablet, Rfl: 0   propranolol (INDERAL) 10 MG tablet, TAKE 1 TABLET BY MOUTH THREE TIMES A DAY, Disp: 90 tablet, Rfl: 0   pseudoephedrine (SUDAFED) 60 MG tablet, Take 1 tablet (60 mg total) by mouth every 8 (eight) hours as needed for congestion., Disp: 30 tablet, Rfl: 0   rizatriptan (MAXALT-MLT) 10 MG disintegrating tablet, Take 1 tablet (10 mg total) by mouth as needed for migraine. May repeat in 2 hours if needed, Disp: 90 tablet, Rfl: 1   sertraline (ZOLOFT) 100 MG tablet, Take 2 tablets  (200 mg total) by mouth daily., Disp: 60 tablet, Rfl: 2   triamcinolone cream (KENALOG) 0.1 %, Apply 1 application topically 2 (two) times daily., Disp: 30 g, Rfl: 0   valACYclovir (VALTREX) 1000 MG tablet, Take 2,000 mg by mouth 2 (two) times daily., Disp: , Rfl:    doxycycline (VIBRAMYCIN) 100 MG capsule, Take 100 mg by mouth 2 (two) times daily., Disp: , Rfl:  Allergies  Allergen Reactions   Peanut-Containing Drug Products     Tree nut specifically    Other     ALL FRUIT     ROS: A complete ROS was performed with pertinent positives/negatives noted in the HPI. The remainder of the ROS are negative.    Objective:   Today's Vitals   09/12/23 0907  BP: 120/84  Pulse: 91  Temp:  97.8 F (36.6 C)  TempSrc: Temporal  SpO2: 99%  Weight: 202 lb 9.6 oz (91.9 kg)  Height: 5\' 2"  (1.575 m)    GENERAL: Well-appearing, in NAD. Well nourished.  SKIN: Pink, warm and dry. No rash, lesion, ulceration, or ecchymoses.  NECK: Trachea midline. Full ROM w/o pain or tenderness. No lymphadenopathy.  RESPIRATORY: Chest wall symmetrical. Respirations even and non-labored. Breath sounds clear to auscultation bilaterally.  CARDIAC: S1, S2 present, regular rate and rhythm. Peripheral pulses 2+ bilaterally.  MSK: Muscle tone and strength appropriate for age. Swelling to bilateral hands, particularly at DIP and PIP joints.  EXTREMITIES: Without clubbing, cyanosis, or edema.  NEUROLOGIC:  Steady, even gait.  PSYCH/MENTAL STATUS: Alert, oriented x 3. Cooperative, appropriate mood and affect.    No results found for any visits on 09/12/23.    Assessment & Plan:  Assessment and Plan    Lupus flare Experiencing flare with fatigue, rash, and swelling. On hydroxychloroquine and meloxicam. Awaiting rheumatologist transition. - Prescribe 6-day steroid taper. - Ensure rheumatologist referral processed; follow up if no contact in two weeks.      Meds ordered this encounter  Medications   predniSONE  (STERAPRED UNI-PAK 21 TAB) 10 MG (21) TBPK tablet    Sig: Take as directed on packaging.    Dispense:  21 tablet    Refill:  0    Supervising Provider:   THOMPSON, AARON B [[id number]]   No orders of the defined types were placed in this encounter.  Lab Orders  No laboratory test(s) ordered today   No images are attached to the encounter or orders placed in the encounter.  Return if symptoms worsen or fail to improve.   Gavin Kast, FNP

## 2023-09-18 ENCOUNTER — Other Ambulatory Visit: Payer: Self-pay | Admitting: Nurse Practitioner

## 2023-09-18 DIAGNOSIS — F411 Generalized anxiety disorder: Secondary | ICD-10-CM

## 2023-09-18 DIAGNOSIS — F331 Major depressive disorder, recurrent, moderate: Secondary | ICD-10-CM

## 2023-09-19 NOTE — Telephone Encounter (Signed)
 Requesting:  SERTRALINE  HCL 100 MG TABLET  Last Visit: 05/12/2023 Next Visit: Visit date not found Last Refill: 04/05/2023 by Dr. Faye Hoops  Please Advise

## 2023-09-21 ENCOUNTER — Ambulatory Visit: Admitting: Physician Assistant

## 2023-09-21 ENCOUNTER — Encounter: Payer: Self-pay | Admitting: Physician Assistant

## 2023-09-21 VITALS — BP 136/88 | HR 86 | Ht 62.0 in | Wt 203.0 lb

## 2023-09-21 DIAGNOSIS — R1011 Right upper quadrant pain: Secondary | ICD-10-CM | POA: Diagnosis not present

## 2023-09-21 DIAGNOSIS — D509 Iron deficiency anemia, unspecified: Secondary | ICD-10-CM | POA: Diagnosis not present

## 2023-09-21 DIAGNOSIS — K625 Hemorrhage of anus and rectum: Secondary | ICD-10-CM

## 2023-09-21 DIAGNOSIS — K219 Gastro-esophageal reflux disease without esophagitis: Secondary | ICD-10-CM

## 2023-09-21 DIAGNOSIS — K5900 Constipation, unspecified: Secondary | ICD-10-CM

## 2023-09-21 DIAGNOSIS — R7982 Elevated C-reactive protein (CRP): Secondary | ICD-10-CM

## 2023-09-21 DIAGNOSIS — R197 Diarrhea, unspecified: Secondary | ICD-10-CM

## 2023-09-21 DIAGNOSIS — M329 Systemic lupus erythematosus, unspecified: Secondary | ICD-10-CM

## 2023-09-21 DIAGNOSIS — N920 Excessive and frequent menstruation with regular cycle: Secondary | ICD-10-CM

## 2023-09-21 MED ORDER — NA SULFATE-K SULFATE-MG SULF 17.5-3.13-1.6 GM/177ML PO SOLN: 1.0000 | Freq: Once | ORAL | 0 refills | Status: AC

## 2023-09-21 MED ORDER — PANTOPRAZOLE SODIUM 40 MG PO TBEC
40.0000 mg | DELAYED_RELEASE_TABLET | Freq: Every day | ORAL | 3 refills | Status: DC
Start: 2023-09-21 — End: 2023-10-31

## 2023-09-21 NOTE — Patient Instructions (Addendum)
 You have been scheduled for an endoscopy and colonoscopy. Please follow the written instructions given to you at your visit today.  If you use inhalers (even only as needed), please bring them with you on the day of your procedure.  DO NOT TAKE 7 DAYS PRIOR TO TEST- Trulicity (dulaglutide) Ozempic, Wegovy (semaglutide) Mounjaro (tirzepatide) Bydureon Bcise (exanatide extended release)  DO NOT TAKE 1 DAY PRIOR TO YOUR TEST Rybelsus (semaglutide) Adlyxin (lixisenatide) Victoza (liraglutide) Byetta (exanatide) _______________________________________________________________________  Please take your proton pump inhibitor medication, pantoprazole  40 mg  Please take this medication 30 minutes to 1 hour before meals- this makes it more effective.  Avoid spicy and acidic foods Avoid fatty foods Limit your intake of coffee, tea, alcohol, and carbonated drinks Work to maintain a healthy weight Keep the head of the bed elevated at least 3 inches with blocks or a wedge pillow if you are having any nighttime symptoms Stay upright for 2 hours after eating Avoid meals and snacks three to four hours before bedtime  First do a trial off milk/lactose products if you use them.  Add fiber like benefiber or citracel once a day Increase activity Can do trial of IBGard which is over the counter for AB pain- Take 1-2 capsules once a day for maintence or twice a day during a flare  Please try to decrease stress. consider talking with PCP about anti anxiety medication or try head space app for meditation. if any worsening symptoms like blood in stool, weight loss, please call the office    Thank you for trusting me with your gastrointestinal care!   Santina Cull, PA-C    FODMAP stands for fermentable oligo-, di-, mono-saccharides and polyols (1). These are the scientific terms used to classify groups of carbs that are difficult for our body to digest and that are notorious for triggering digestive  symptoms like bloating, gas, loose stools and stomach pain.   You can try low FODMAP diet  - start with eliminating just one column at a time that you feel may be a trigger for you. - the table at the very bottom contains foods that are low in FODMAPs   Sometimes trying to eliminate the FODMAP's from your diet is difficult or tricky, if you are stuggling with trying to do the elimination diet you can try an enzyme.  There is a food enzymes that you sprinkle in or on your food that helps break down the FODMAP. You can read more about the enzyme by going to this site: https://fodzyme.com/

## 2023-09-21 NOTE — Progress Notes (Signed)
 09/21/2023 Jamie Lopez [id number] 26-May-1996  Referring provider: Odette Benjamin, NP Primary GI doctor: Dr. [name]  ASSESSMENT AND PLAN:  GERD with RUQ pain, some radiation right lower AB worse with foods/supplements Started after NSAIDS/prednisone  for lupus 10/13/2022 CT abdomen pelvis with contrast for thoracic adenopathy was unremarkable, normal liver normal gall bladder No melena Carafate  did not help, prilosec 40 mg not helping, some help with pantoprazole  -Lifestyle changes discussed, avoid NSAIDS, ETOH, hand out given to the patient -I discussed risks of EGD, rule out PUD, with patient today, including risk of sedation, bleeding or perforation.  -Patient provides understanding and gave verbal consent to proceed. -Will get right RUQ US  if EGD negative  Alternating diarrhea/constipation with BRB in stool rare, on TP, CRP/sed rate elevated With IDA and change in stools will get colon with EGD Likely IDA is from menorrhagia but with symptoms, elevated CRP, will schedule for colonoscopy as well -Given IBS information -We have discussed the risks of bleeding, infection, perforation, medication reactions, and remote risk of death associated with colonoscopy. All questions were answered and the patient acknowledges these risk and wishes to proceed.  IDA with menorrhagia can not tolerate oral iron 05/12/2023  HGB 12.2 MCV 88.4 Platelets 367.0 05/12/2023 Iron 84 Ferritin 15 B12 769 Recent Labs    10/04/22 1039 05/12/23 1121  HGB 12.3 12.2  May benefit from IV iron Will check EGD/colon, if unremarkable likely from menses.   Systemic lupus x 2022 High doses of NSAIDS/prednisone , started to have burning/gnawing sensation since that time with nausea Sister with HS Now following with [place] rheum, michelle lama   Patient Care Team: Odette Benjamin, NP as PCP - General (Internal Medicine)  HISTORY OF PRESENT ILLNESS: 28 y.o. female with a past medical history listed  below presents for evaluation of GERD. 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   Discussed the use of AI scribe software for clinical note transcription with the patient, who gave verbal consent to proceed.  History of Present Illness   Jamie Wahlquist "[name]" is a 28 year old female with lupus who presents with gastrointestinal symptoms including nausea and epigastric pain.  She has been experiencing gastrointestinal symptoms for the past year and a half, including nausea and epigastric pain. The pain is described as a burning, gnawing sensation that can radiate to the right lower quadrant. Initially, the symptoms were meal-dependent but have become more associated with her medication intake. Significant nausea occurs, particularly at night, disrupting her sleep.  She was diagnosed with lupus in 2022 and was initially treated with high-dose prednisone , meloxicam, and diclofenac for six to eight months. Her gastrointestinal symptoms began a few months after starting these medications. She currently takes Prilosec (omeprazole ) for her symptoms, which has provided some relief, but she has also tried sucralfate  and Protonix  in the past with limited success.  She experiences heavy menstrual periods and has been diagnosed with iron deficiency anemia, with a ferritin level of 15 as of December. Iron supplements are intolerable due to gastrointestinal side effects. Bowel movements are irregular, alternating between constipation and diarrhea, and she occasionally notices blood when wiping, which she attributes to hemorrhoids.  Family history includes a sister with hidradenitis suppurativa, a mother with IBS and type 2 diabetes, and a grandmother with prediabetes. No family history of colon cancer, H. pylori, or stomach cancer. No dark black stools or blood in the stool aside from occasional blood when wiping. She has a bowel movement daily with  varying consistency. No swelling in her legs.        She   reports that she quit smoking about 3 years ago. Her smoking use included cigarettes and cigars. She has never used smokeless tobacco. She reports current alcohol use of about 2.0 standard drinks of alcohol per week. She reports that she does not currently use drugs after having used the following drugs: Marijuana.  RELEVANT GI HISTORY, IMAGING AND LABS: Results   LABS Ferritin: 15 (04/2023) Sed rate: 85 (08/2023) CRP: 27 (08/2023)  RADIOLOGY CT abdomen/pelvis: No abnormalities (09/2022)      CBC    Component Value Date/Time   WBC 3.2 (L) 05/12/2023 1121   RBC 4.16 05/12/2023 1121   HGB 12.2 05/12/2023 1121   HGB 11.0 (L) 07/08/2020 1024   HCT 36.8 05/12/2023 1121   HCT 33.1 (L) 07/08/2020 1024   PLT 367.0 05/12/2023 1121   PLT 350 07/08/2020 1024   MCV 88.4 05/12/2023 1121   MCV 88 07/08/2020 1024   MCH 28.7 10/04/2022 1039   MCHC 33.1 05/12/2023 1121   RDW 13.8 05/12/2023 1121   RDW 13.3 07/08/2020 1024   LYMPHSABS 1.1 05/12/2023 1121   LYMPHSABS 1.5 12/23/2019 1509   MONOABS 0.4 05/12/2023 1121   EOSABS 0.0 05/12/2023 1121   EOSABS 0.1 12/23/2019 1509   BASOSABS 0.0 05/12/2023 1121   BASOSABS 0.0 12/23/2019 1509   Recent Labs    10/04/22 1039 05/12/23 1121  HGB 12.3 12.2    CMP     Component Value Date/Time   NA 137 05/12/2023 1121   NA 135 07/08/2020 1024   K 4.3 05/12/2023 1121   CL 106 05/12/2023 1121   CO2 22 05/12/2023 1121   GLUCOSE 91 05/12/2023 1121   BUN 10 05/12/2023 1121   BUN 11 07/08/2020 1024   CREATININE 0.66 05/12/2023 1121   CREATININE 0.69 07/13/2021 1131   CALCIUM 8.4 05/12/2023 1121   PROT 7.5 05/12/2023 1121   PROT 7.5 07/08/2020 1024   ALBUMIN 3.7 05/12/2023 1121   ALBUMIN 3.7 (L) 07/08/2020 1024   AST 17 05/12/2023 1121   ALT 14 05/12/2023 1121   ALKPHOS 65 05/12/2023 1121   BILITOT 0.4 05/12/2023 1121   BILITOT 0.3 07/08/2020 1024   GFRNONAA >60 10/04/2022 1039   GFRAA 132 07/08/2020 1024      Latest Ref Rng & Units  05/12/2023   11:21 AM 07/13/2021   11:31 AM 03/20/2021    3:25 PM  Hepatic Function  Total Protein 6.0 - 8.3 g/dL 7.5  7.4  8.2   Albumin 3.5 - 5.2 g/dL 3.7   3.5   AST 0 - 37 U/L 17  19  20    ALT 0 - 35 U/L 14  20  16    Alk Phosphatase 39 - 117 U/L 65   54   Total Bilirubin 0.2 - 1.2 mg/dL 0.4  0.3  0.3       Current Medications:   Current Outpatient Medications (Endocrine & Metabolic):    etonogestrel-ethinyl estradiol (NUVARING) 0.12-0.015 MG/24HR vaginal ring, Place 1 each vaginally every 28 (twenty-eight) days. Insert vaginally and leave in place for 3 consecutive weeks, then remove for 1 week.   predniSONE  (STERAPRED UNI-PAK 21 TAB) 10 MG (21) TBPK tablet, Take as directed on packaging.  Current Outpatient Medications (Cardiovascular):    propranolol  (INDERAL ) 10 MG tablet, TAKE 1 TABLET BY MOUTH THREE TIMES A DAY  Current Outpatient Medications (Respiratory):    albuterol  (VENTOLIN  HFA) 108 (90  Base) MCG/ACT inhaler, Inhale 2 puffs into the lungs every 6 (six) hours as needed for wheezing or shortness of breath.   budesonide  (RHINOCORT  ALLERGY) 32 MCG/ACT nasal spray, Place 1-2 sprays into both nostrils daily.   cetirizine  (ZYRTEC  ALLERGY) 10 MG tablet, Take 1 tablet (10 mg total) by mouth daily.   pseudoephedrine  (SUDAFED) 60 MG tablet, Take 1 tablet (60 mg total) by mouth every 8 (eight) hours as needed for congestion.  Current Outpatient Medications (Analgesics):    diclofenac (VOLTAREN) 75 MG EC tablet, Take 75 mg by mouth 2 (two) times daily.   ibuprofen  (ADVIL ) 600 MG tablet, Take 1 tablet (600 mg total) by mouth every 6 (six) hours as needed.   meloxicam (MOBIC) 15 MG tablet, Take 15 mg by mouth daily as needed for pain.   rizatriptan  (MAXALT -MLT) 10 MG disintegrating tablet, Take 1 tablet (10 mg total) by mouth as needed for migraine. May repeat in 2 hours if needed   naproxen  (NAPROSYN ) 500 MG tablet, Take 1 tablet (500 mg total) by mouth 2 (two) times daily. (Patient  not taking: Reported on 09/21/2023)   Current Outpatient Medications (Other):    buPROPion  (WELLBUTRIN  XL) 150 MG 24 hr tablet, Take 1 tablet (150 mg total) by mouth daily.   buPROPion  (WELLBUTRIN  XL) 300 MG 24 hr tablet, Take 1 tablet (300 mg total) by mouth every morning.   busPIRone  (BUSPAR ) 15 MG tablet, Take 1 tablet (15 mg total) by mouth daily.   hydroxychloroquine (PLAQUENIL) 200 MG tablet, Take 200 mg by mouth 2 (two) times daily.   hydrOXYzine  (VISTARIL ) 25 MG capsule, Take 25-50 mg by mouth at bedtime as needed.   Na Sulfate-K Sulfate-Mg Sulfate concentrate (SUPREP) 17.5-3.13-1.6 GM/177ML SOLN, Take 1 kit (354 mLs total) by mouth once for 1 dose.   Olopatadine  HCl (PATADAY ) 0.2 % SOLN, Apply 1 drop to eye daily as needed.   ondansetron  (ZOFRAN -ODT) 4 MG disintegrating tablet, TAKE 1 TABLET (4 MG TOTAL) BY MOUTH AS NEEDED.MAX 16MG  PER DAY   pantoprazole  (PROTONIX ) 40 MG tablet, Take 1 tablet (40 mg total) by mouth daily.   sertraline  (ZOLOFT ) 100 MG tablet, TAKE 2 TABLETS BY MOUTH EVERY DAY   triamcinolone  cream (KENALOG ) 0.1 %, Apply 1 application topically 2 (two) times daily.   valACYclovir (VALTREX) 1000 MG tablet, Take 2,000 mg by mouth 2 (two) times daily.  Medical History:  Past Medical History:  Diagnosis Date   Allergy 04/1999   Seasonal allergies   Anxiety    Asthma    Depression 01/2013   GERD (gastroesophageal reflux disease) 07/2020   Headache(784.0)    Lupus    Rosacea    Seasonal allergies    Allergies:  Allergies  Allergen Reactions   Peanut-Containing Drug Products     Tree nut specifically    Other     ALL FRUIT     Surgical History:  She  has a past surgical history that includes Wisdom tooth extraction. Family History:  Her family history includes Alcohol abuse in her maternal grandfather and paternal grandfather; Anxiety disorder in her maternal grandfather, maternal grandmother, and mother; Arthritis in her maternal grandfather; Asthma in her  father; Bipolar disorder in her cousin; Cancer in her mother; Depression in her maternal grandfather, maternal grandmother, and mother; Diabetes in her mother; Diabetes Mellitus II in her maternal grandmother and mother; Drug abuse in her father, maternal grandfather, and paternal grandfather; Early death in her father; Hypertension in her maternal grandmother; Miscarriages / [place] in  her mother; Schizophrenia in her cousin.  REVIEW OF SYSTEMS  : All other systems reviewed and negative except where noted in the History of Present Illness.  PHYSICAL EXAM: BP 136/88   Pulse 86   Ht 5\' 2"  (1.575 m)   Wt 203 lb (92.1 kg)   LMP 09/12/2023   SpO2 95%   BMI 37.13 kg/m  Physical Exam   GENERAL APPEARANCE: Well nourished, in no apparent distress. HEENT: No cervical lymphadenopathy, unremarkable thyroid , sclerae anicteric, conjunctiva pink. RESPIRATORY: Respiratory effort normal, BS equal bilateral without rales, rhonchi, wheezing. CARDIO: RRR with no MRGs, peripheral pulses intact. ABDOMEN: Soft, non distended, active bowel sounds in all 4 quadrants, non-tender to palpation, no rebound, no mass appreciated. RECTAL: Declines. MUSCULOSKELETAL: Full ROM, normal gait, without edema. SKIN: Dry, intact without rashes or lesions. No jaundice. NEURO: Alert, oriented, no focal deficits. PSYCH: Cooperative, normal mood and affect. NECK: Neck without nodules. EXTREMITIES: No leg swelling.      Edmonia Gottron, PA-C 12:15 PM

## 2023-09-22 ENCOUNTER — Encounter: Payer: Self-pay | Admitting: Internal Medicine

## 2023-09-22 NOTE — Progress Notes (Signed)
 I agree with the assessment and plan as outlined by Ms. Steffanie Dunn.

## 2023-09-25 MED ORDER — PREDNISONE 10 MG PO TABS: ORAL_TABLET | ORAL | 0 refills | Status: DC

## 2023-10-06 ENCOUNTER — Encounter: Payer: Self-pay | Admitting: Internal Medicine

## 2023-10-06 ENCOUNTER — Ambulatory Visit (INDEPENDENT_AMBULATORY_CARE_PROVIDER_SITE_OTHER): Admitting: Internal Medicine

## 2023-10-06 ENCOUNTER — Ambulatory Visit: Payer: Self-pay

## 2023-10-06 VITALS — BP 116/80 | HR 79 | Temp 98.2°F | Ht 62.0 in | Wt 204.6 lb

## 2023-10-06 DIAGNOSIS — R002 Palpitations: Secondary | ICD-10-CM | POA: Diagnosis not present

## 2023-10-06 DIAGNOSIS — F411 Generalized anxiety disorder: Secondary | ICD-10-CM

## 2023-10-06 LAB — IBC + FERRITIN
Ferritin: 15.2 ng/mL (ref 10.0–291.0)
Iron: 57 ug/dL (ref 42–145)
Saturation Ratios: 11.7 % — ABNORMAL LOW (ref 20.0–50.0)
TIBC: 487.2 ug/dL — ABNORMAL HIGH (ref 250.0–450.0)
Transferrin: 348 mg/dL (ref 212.0–360.0)

## 2023-10-06 LAB — COMPREHENSIVE METABOLIC PANEL WITH GFR
ALT: 23 U/L (ref 0–35)
AST: 20 U/L (ref 0–37)
Albumin: 3.6 g/dL (ref 3.5–5.2)
Alkaline Phosphatase: 56 U/L (ref 39–117)
BUN: 9 mg/dL (ref 6–23)
CO2: 22 meq/L (ref 19–32)
Calcium: 8.6 mg/dL (ref 8.4–10.5)
Chloride: 107 meq/L (ref 96–112)
Creatinine, Ser: 0.75 mg/dL (ref 0.40–1.20)
GFR: 108.35 mL/min (ref >60.00)
Glucose, Bld: 82 mg/dL (ref 70–99)
Potassium: 3.9 meq/L (ref 3.5–5.1)
Sodium: 136 meq/L (ref 135–145)
Total Bilirubin: 0.3 mg/dL (ref 0.2–1.2)
Total Protein: 7.5 g/dL (ref 6.0–8.3)

## 2023-10-06 LAB — TSH: TSH: 1.4 u[IU]/mL (ref 0.35–5.50)

## 2023-10-06 LAB — CBC WITH DIFFERENTIAL/PLATELET
Basophils Absolute: 0 10*3/uL (ref 0.0–0.1)
Basophils Relative: 1.1 % (ref 0.0–3.0)
Eosinophils Absolute: 0 10*3/uL (ref 0.0–0.7)
Eosinophils Relative: 1.3 % (ref 0.0–5.0)
HCT: 36 % (ref 36.0–46.0)
Hemoglobin: 12 g/dL (ref 12.0–15.0)
Lymphocytes Relative: 34.2 % (ref 12.0–46.0)
Lymphs Abs: 1 10*3/uL (ref 0.7–4.0)
MCHC: 33.3 g/dL (ref 30.0–36.0)
MCV: 89.4 fl (ref 78.0–100.0)
Monocytes Absolute: 0.4 10*3/uL (ref 0.1–1.0)
Monocytes Relative: 14.5 % — ABNORMAL HIGH (ref 3.0–12.0)
Neutro Abs: 1.4 10*3/uL (ref 1.4–7.7)
Neutrophils Relative %: 48.9 % (ref 43.0–77.0)
Platelets: 365 10*3/uL (ref 150.0–400.0)
RBC: 4.03 Mil/uL (ref 3.87–5.11)
RDW: 13.4 % (ref 11.5–15.5)
WBC: 2.9 10*3/uL — ABNORMAL LOW (ref 4.0–10.5)

## 2023-10-06 MED ORDER — PROPRANOLOL HCL 10 MG PO TABS: 20.0000 mg | ORAL_TABLET | Freq: Two times a day (BID) | ORAL | Status: DC

## 2023-10-06 NOTE — Patient Instructions (Signed)
 Please notify me if palpitations persist after receiving your lab work results. Then we will proceed with holter monitor. AND if needed, referral for iron infusions.

## 2023-10-06 NOTE — Telephone Encounter (Signed)
 Noted. Patient schedule for an appointment by nurse triage.

## 2023-10-06 NOTE — Progress Notes (Unsigned)
 [facility] [address] [place] [address] Dept: [phone] Dept Fax: [phone]  Acute Care Office Visit  Subjective:   Jamie Lopez [birth year] 10/06/2023  Chief Complaint  Patient presents with   Palpitations    Started 3 days ago     HPI: Discussed the use of AI scribe software for clinical note transcription with the patient, who gave verbal consent to proceed.  History of Present Illness   Jamie Lopez "[name]" is a 28 year old female with iron deficiency anemia who presents with heart palpitations.  She has been experiencing consistent and noticeable heart palpitations for the past two days, occurring unexpectedly and not associated with any specific activity. The palpitations are less noticeable when standing. She has not experienced any chest pain but mentions that the palpitations can cause breathlessness.  She has a history of iron deficiency anemia with a recent ferritin level of 15. She is intolerant to oral iron supplements due to gastrointestinal discomfort. She experiences bleeding during bowel movements and is scheduled for an endoscopy and colonoscopy on June 3rd to investigate potential gastrointestinal bleeding.  Her current medications include propranolol , 30 mg once daily, and she has recently completed a prednisone  taper. She has also started taking Protonix . There have been no recent changes to her medication regimen aside from the addition of Protonix .  She consumes caffeine regularly, typically one energy drink or coffee per day. 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  During the review of symptoms, she denies any chest pain or shortness of breath, although she notes that the palpitations can cause her to lose her breath.       The following portions of the patient's history were reviewed and updated as appropriate: past medical history, past surgical  history, family history, social history, allergies, medications, and problem list.   Patient Active Problem List   Diagnosis Date Noted   Tinnitus of both ears 07/06/2023   Routine general medical examination at a health care facility 05/12/2023   MDD (major depressive disorder), recurrent episode, moderate (HCC) 02/21/2023   Insomnia due to other mental disorder 02/21/2023   Chest pain 10/06/2022   Lymphadenopathy 10/06/2022   Iron deficiency anemia due to chronic blood loss 04/29/2022   Dizziness 04/14/2022   Vitamin D  deficiency 04/14/2022   Gastroesophageal reflux disease 01/28/2022   Chronic fatigue 01/28/2022   Systemic lupus erythematosus (HCC) 07/16/2020   GAD (generalized anxiety disorder) 10/25/2012   Migraine 05/28/2011   Past Medical History:  Diagnosis Date   Allergy 04/1999   Seasonal allergies   Anxiety    Asthma    Depression 01/2013   GERD (gastroesophageal reflux disease) 07/2020   Headache(784.0)    Lupus    Rosacea    Seasonal allergies    Past Surgical History:  Procedure Laterality Date   WISDOM TOOTH EXTRACTION     Family History  Problem Relation Age of Onset   Anxiety disorder Mother    Diabetes Mellitus II Mother    Depression Mother    Cancer Mother        ovarian   Diabetes Mother    Miscarriages / [place] Mother    Drug abuse Father    Asthma Father    Early death Father    Anxiety disorder Maternal Grandmother    Diabetes Mellitus II Maternal Grandmother    Depression Maternal Grandmother    Hypertension Maternal Grandmother    Anxiety  disorder Maternal Grandfather    Depression Maternal Grandfather    Alcohol abuse Maternal Grandfather    Drug abuse Maternal Grandfather    Arthritis Maternal Grandfather    Alcohol abuse Paternal Grandfather    Drug abuse Paternal Grandfather    Bipolar disorder Cousin    Schizophrenia Cousin     Current Outpatient Medications:    albuterol  (VENTOLIN  HFA) 108 (90 Base) MCG/ACT inhaler,  Inhale 2 puffs into the lungs every 6 (six) hours as needed for wheezing or shortness of breath., Disp: 8 g, Rfl: 6   budesonide  (RHINOCORT  ALLERGY) 32 MCG/ACT nasal spray, Place 1-2 sprays into both nostrils daily., Disp: 8.6 g, Rfl: 6   buPROPion  (WELLBUTRIN  XL) 150 MG 24 hr tablet, Take 1 tablet (150 mg total) by mouth daily., Disp: 30 tablet, Rfl: 2   buPROPion  (WELLBUTRIN  XL) 300 MG 24 hr tablet, Take 1 tablet (300 mg total) by mouth every morning., Disp: 30 tablet, Rfl: 2   busPIRone  (BUSPAR ) 15 MG tablet, Take 1 tablet (15 mg total) by mouth daily., Disp: 30 tablet, Rfl: 2   diclofenac (VOLTAREN) 75 MG EC tablet, Take 75 mg by mouth 2 (two) times daily., Disp: , Rfl:    etonogestrel-ethinyl estradiol (NUVARING) 0.12-0.015 MG/24HR vaginal ring, Place 1 each vaginally every 28 (twenty-eight) days. Insert vaginally and leave in place for 3 consecutive weeks, then remove for 1 week., Disp: , Rfl:    hydroxychloroquine (PLAQUENIL) 200 MG tablet, Take 200 mg by mouth 2 (two) times daily., Disp: , Rfl:    hydrOXYzine  (VISTARIL ) 25 MG capsule, Take 25-50 mg by mouth at bedtime as needed., Disp: , Rfl:    ibuprofen  (ADVIL ) 600 MG tablet, Take 1 tablet (600 mg total) by mouth every 6 (six) hours as needed., Disp: 30 tablet, Rfl: 0   meloxicam (MOBIC) 15 MG tablet, Take 15 mg by mouth daily as needed for pain., Disp: , Rfl:    naproxen  (NAPROSYN ) 500 MG tablet, Take 1 tablet (500 mg total) by mouth 2 (two) times daily., Disp: 30 tablet, Rfl: 0   Olopatadine  HCl (PATADAY ) 0.2 % SOLN, Apply 1 drop to eye daily as needed., Disp: 2.5 mL, Rfl: 3   ondansetron  (ZOFRAN -ODT) 4 MG disintegrating tablet, TAKE 1 TABLET (4 MG TOTAL) BY MOUTH AS NEEDED.MAX 16MG  PER DAY, Disp: 30 tablet, Rfl: 1   pantoprazole  (PROTONIX ) 40 MG tablet, Take 1 tablet (40 mg total) by mouth daily., Disp: 30 tablet, Rfl: 3   predniSONE  (DELTASONE ) 10 MG tablet, Take 4 tablets by mouth on day 1 , then 3 tablets by mouth on day 2, then 2  tablets by mouth on day 3, then 1 tablet by mouth on day 4., Disp: 10 tablet, Rfl: 0   pseudoephedrine  (SUDAFED) 60 MG tablet, Take 1 tablet (60 mg total) by mouth every 8 (eight) hours as needed for congestion., Disp: 30 tablet, Rfl: 0   rizatriptan  (MAXALT -MLT) 10 MG disintegrating tablet, Take 1 tablet (10 mg total) by mouth as needed for migraine. May repeat in 2 hours if needed, Disp: 90 tablet, Rfl: 1   sertraline  (ZOLOFT ) 100 MG tablet, TAKE 2 TABLETS BY MOUTH EVERY DAY, Disp: 180 tablet, Rfl: 1   triamcinolone  cream (KENALOG ) 0.1 %, Apply 1 application topically 2 (two) times daily., Disp: 30 g, Rfl: 0   valACYclovir (VALTREX) 1000 MG tablet, Take 2,000 mg by mouth 2 (two) times daily., Disp: , Rfl:    propranolol  (INDERAL ) 10 MG tablet, Take 2 tablets (20 mg  total) by mouth 2 (two) times daily., Disp: , Rfl:  Allergies  Allergen Reactions   Peanut-Containing Drug Products     Tree nut specifically    Other     ALL FRUIT     ROS: A complete ROS was performed with pertinent positives/negatives noted in the HPI. The remainder of the ROS are negative.    Objective:   Today's Vitals   10/06/23 1026  BP: 116/80  Pulse: 79  Temp: 98.2 F (36.8 C)  TempSrc: Temporal  SpO2: 98%  Weight: 204 lb 9.6 oz (92.8 kg)  Height: 5\' 2"  (1.575 m)    GENERAL: Well-appearing, in NAD. Well nourished.  SKIN: Pink, warm and dry. No rash, lesion, ulceration, or ecchymoses.  NECK: Trachea midline. Full ROM w/o pain or tenderness. No lymphadenopathy.  RESPIRATORY: Chest wall symmetrical. Respirations even and non-labored. Breath sounds clear to auscultation bilaterally.  CARDIAC: S1, S2 present, regular rate and rhythm. Peripheral pulses 2+ bilaterally.  EXTREMITIES: Without clubbing, cyanosis, or edema.  NEUROLOGIC: No motor or sensory deficits. Steady, even gait.  PSYCH/MENTAL STATUS: Alert, oriented x 3. Cooperative, appropriate mood and affect.   EKG RESULT: EKG tracing is personally  reviewed.   EKG: normal EKG, normal sinus rhythm.    No results found for any visits on 10/06/23.    Assessment & Plan:  Assessment and Plan    Heart palpitations Palpitations began two days ago, causing dyspnea without chest pain. EKG normal. Differential includes stress, caffeine, and iron deficiency anemia. Priority is ruling out iron deficiency anemia. - Order CBC, CMP, TSH, and iron profile. - Adjust propranolol  to 20 mg twice daily. - Advise to limit caffeine intake to one caffeinated drink per day. - Consider Holter monitor or switch to metoprolol if palpitations persist and lab work is normal.  Iron deficiency anemia Iron deficiency anemia confirmed with ferritin level of 15. Intolerant to oral iron supplements. Scheduled for endoscopy and colonoscopy to rule out gastrointestinal bleeding. - Refer for iron infusions if iron deficiency is confirmed. - Proceed with scheduled endoscopy and colonoscopy.        Meds ordered this encounter  Medications   propranolol  (INDERAL ) 10 MG tablet    Sig: Take 2 tablets (20 mg total) by mouth 2 (two) times daily.    The patient has updated the prescriber from the original prescriber    Supervising Provider:   Catheryn Cluck [[id number]]   Orders Placed This Encounter  Procedures   CBC with Differential/Platelet   Comp Met (CMET)   TSH   IBC + Ferritin   EKG 12-Lead   Lab Orders         CBC with Differential/Platelet         Comp Met (CMET)         TSH         IBC + Ferritin     No images are attached to the encounter or orders placed in the encounter.  Return if symptoms worsen or fail to improve.   Gavin Kast, FNP

## 2023-10-06 NOTE — Telephone Encounter (Signed)
  Chief Complaint: heart palpitations Symptoms: palpitation  Frequency: intermittent X 2 days Pertinent Negatives: Patient denies chest pain Disposition: [] ED /[] Urgent Care (no appt availability in office) / [] Appointment(In office/virtual)/ []  [facility]/ [] Home Care/ [] Refused Recommended Disposition /[] [facility]/ []  Follow-up with PCP Additional Notes:  Self scheduled-Triaged. She is a Educational psychologist. She is feeling palpitations. Her watch shows premature beats. Occasional mild shortness of breath and mild dizziness, not experiencing now. Ok for appointment. She is proceeding into practice for appointment.   Copied from CRM [id number]. Topic: Clinical - Red Word Triage >> Oct 06, 2023 10:15 AM Marlan Silva wrote: Red Word that prompted transfer to Nurse Triage: Per appointment notes, patient needs to be triaged by a nurse due to Heart Palpitations. Reason for Disposition  [1] Skipped or extra beat(s) AND [2] occurs 4 or more times per minute  Protocols used: Heart Rate and Heartbeat Questions-A-AH

## 2023-10-10 ENCOUNTER — Ambulatory Visit: Payer: Self-pay | Admitting: Internal Medicine

## 2023-10-10 DIAGNOSIS — R002 Palpitations: Secondary | ICD-10-CM

## 2023-10-11 ENCOUNTER — Ambulatory Visit: Attending: Internal Medicine

## 2023-10-11 DIAGNOSIS — R002 Palpitations: Secondary | ICD-10-CM

## 2023-10-11 NOTE — Progress Notes (Unsigned)
 EP to read.

## 2023-10-20 ENCOUNTER — Encounter: Payer: Self-pay | Admitting: Internal Medicine

## 2023-10-29 ENCOUNTER — Ambulatory Visit
Admission: RE | Admit: 2023-10-29 | Discharge: 2023-10-29 | Disposition: A | Discharge status: Home / Self Care | Source: Ambulatory Visit | Attending: Physician Assistant | Admitting: Physician Assistant

## 2023-10-29 ENCOUNTER — Other Ambulatory Visit: Payer: Self-pay

## 2023-10-29 VITALS — BP 119/89 | HR 99 | Temp 98.8°F | Resp 18 | Ht 62.0 in | Wt 205.0 lb

## 2023-10-29 DIAGNOSIS — H18892 Other specified disorders of cornea, left eye: Secondary | ICD-10-CM

## 2023-10-29 DIAGNOSIS — H5789 Other specified disorders of eye and adnexa: Secondary | ICD-10-CM | POA: Diagnosis not present

## 2023-10-29 MED ORDER — CIPROFLOXACIN HCL 0.3 % OP SOLN: 1.0000 [drp] | Freq: Four times a day (QID) | OPHTHALMIC | 0 refills | Status: AC

## 2023-10-29 NOTE — ED Triage Notes (Signed)
 Pt presents with complaints of bilateral eye swelling and redness that began yesterday 5/31. Left eye is feeling worse than the right eye. Symptoms began after removing contacts. Contacts were in place for two days. Itching and drainage also reported. Pt rates her overall pain a 1/10, describes as throbbing.

## 2023-10-29 NOTE — ED Provider Notes (Signed)
 [facility]    CSN: [id number] Arrival date & time: 10/29/23  1426      History   Chief Complaint Chief Complaint  Patient presents with   Eye Problem    Both Eyes swollen and red. - Entered by patient    HPI Jamie Lopez is a 28 y.o. female.   HPI  Pt reports that her eyes have been swollen and red  She reports left eye is having more severe symptoms than the right, states the right eye is improving  She also reports foreign body sensation in the left eye She reports the symptoms mainly started after taking her contacts out yesterday  She reports drainage from the eyes and states she has crustiness along the orbit especially in the AM when she gets up   Interventions: OTC Pataday  drops    Past Medical History:  Diagnosis Date   Allergy 04/1999   Seasonal allergies   Anxiety    Asthma    Depression 01/2013   GERD (gastroesophageal reflux disease) 07/2020   Headache(784.0)    Lupus    Rosacea    Seasonal allergies     Patient Active Problem List   Diagnosis Date Noted   Tinnitus of both ears 07/06/2023   Routine general medical examination at a health care facility 05/12/2023   MDD (major depressive disorder), recurrent episode, moderate (HCC) 02/21/2023   Insomnia due to other mental disorder 02/21/2023   Chest pain 10/06/2022   Lymphadenopathy 10/06/2022   Iron deficiency anemia due to chronic blood loss 04/29/2022   Dizziness 04/14/2022   Vitamin D  deficiency 04/14/2022   Gastroesophageal reflux disease 01/28/2022   Chronic fatigue 01/28/2022   Systemic lupus erythematosus (HCC) 07/16/2020   GAD (generalized anxiety disorder) 10/25/2012   Migraine 05/28/2011    Past Surgical History:  Procedure Laterality Date   WISDOM TOOTH EXTRACTION      OB History   No obstetric history on file.      Home Medications    Prior to Admission medications   Medication Sig Start Date End Date Taking? Authorizing Provider  ciprofloxacin  (CILOXAN) 0.3 % ophthalmic solution Place 1-2 drops into the left eye every 6 (six) hours for 5 days. 10/29/23 11/03/23 Yes Zainah Steven E, PA-C  albuterol  (VENTOLIN  HFA) 108 (90 Base) MCG/ACT inhaler Inhale 2 puffs into the lungs every 6 (six) hours as needed for wheezing or shortness of breath. 08/15/23   McElwee, Adolfo Hooker, NP  budesonide  (RHINOCORT  ALLERGY) 32 MCG/ACT nasal spray Place 1-2 sprays into both nostrils daily. 08/25/18   Benjiman Bras, MD  buPROPion  (WELLBUTRIN  XL) 150 MG 24 hr tablet Take 1 tablet (150 mg total) by mouth daily. 04/05/23   Marilou Showman, MD  buPROPion  (WELLBUTRIN  XL) 300 MG 24 hr tablet Take 1 tablet (300 mg total) by mouth every morning. 04/05/23 04/04/24  Marilou Showman, MD  busPIRone  (BUSPAR ) 15 MG tablet Take 1 tablet (15 mg total) by mouth daily. 04/05/23   Marilou Showman, MD  diclofenac (VOLTAREN) 75 MG EC tablet Take 75 mg by mouth 2 (two) times daily.    [provider]  etonogestrel-ethinyl estradiol (NUVARING) 0.12-0.015 MG/24HR vaginal ring Place 1 each vaginally every 28 (twenty-eight) days. Insert vaginally and leave in place for 3 consecutive weeks, then remove for 1 week.    [provider]  hydroxychloroquine (PLAQUENIL) 200 MG tablet Take 200 mg by mouth 2 (two) times daily. 06/30/20   [provider]  hydrOXYzine  (VISTARIL ) 25 MG  capsule Take 25-50 mg by mouth at bedtime as needed. 08/13/23   [provider]  ibuprofen  (ADVIL ) 600 MG tablet Take 1 tablet (600 mg total) by mouth every 6 (six) hours as needed. 06/28/22   Adolph Hoop, PA-C  meloxicam (MOBIC) 15 MG tablet Take 15 mg by mouth daily as needed for pain.    [provider]  naproxen  (NAPROSYN ) 500 MG tablet Take 1 tablet (500 mg total) by mouth 2 (two) times daily. 06/01/18   Kendrick, Caitlyn S, PA-C  Olopatadine  HCl (PATADAY ) 0.2 % SOLN Apply 1 drop to eye daily as needed. 08/15/23   McElwee, Lauren A, NP  ondansetron  (ZOFRAN -ODT) 4 MG disintegrating tablet  TAKE 1 TABLET (4 MG TOTAL) BY MOUTH AS NEEDED.MAX 16MG  PER DAY 07/17/23   McElwee, Lauren A, NP  pantoprazole  (PROTONIX ) 40 MG tablet Take 1 tablet (40 mg total) by mouth daily. 09/21/23   Edmonia Gottron, PA-C  predniSONE  (DELTASONE ) 10 MG tablet Take 4 tablets by mouth on day 1 , then 3 tablets by mouth on day 2, then 2 tablets by mouth on day 3, then 1 tablet by mouth on day 4. 09/25/23   Gavin Kast, FNP  propranolol  (INDERAL ) 10 MG tablet Take 2 tablets (20 mg total) by mouth 2 (two) times daily. 10/06/23   Gavin Kast, FNP  pseudoephedrine  (SUDAFED) 60 MG tablet Take 1 tablet (60 mg total) by mouth every 8 (eight) hours as needed for congestion. 06/28/22   Adolph Hoop, PA-C  rizatriptan  (MAXALT -MLT) 10 MG disintegrating tablet Take 1 tablet (10 mg total) by mouth as needed for migraine. May repeat in 2 hours if needed 01/29/21   Ulyess Gammons, NP  sertraline  (ZOLOFT ) 100 MG tablet TAKE 2 TABLETS BY MOUTH EVERY DAY 09/20/23   McElwee, Lauren A, NP  triamcinolone  cream (KENALOG ) 0.1 % Apply 1 application topically 2 (two) times daily. 01/02/19   Ulyess Gammons, NP  valACYclovir (VALTREX) 1000 MG tablet Take 2,000 mg by mouth 2 (two) times daily.    [provider]    Family History Family History  Problem Relation Age of Onset   Anxiety disorder Mother    Diabetes Mellitus II Mother    Depression Mother    Cancer Mother        ovarian   Diabetes Mother    Miscarriages / [place] Mother    Drug abuse Father    Asthma Father    Early death Father    Anxiety disorder Maternal Grandmother    Diabetes Mellitus II Maternal Grandmother    Depression Maternal Grandmother    Hypertension Maternal Grandmother    Anxiety disorder Maternal Grandfather    Depression Maternal Grandfather    Alcohol abuse Maternal Grandfather    Drug abuse Maternal Grandfather    Arthritis Maternal Grandfather    Alcohol abuse Paternal Grandfather    Drug abuse Paternal Grandfather    Bipolar  disorder Cousin    Schizophrenia Cousin     Social History Social History   Tobacco Use   Smoking status: Former    Current packs/day: 0.00    Types: Cigarettes, Cigars    Quit date: 2022    Years since quitting: 3.4   Smokeless tobacco: Never   Tobacco comments:    once a year or less when very stressed.  Vaping Use   Vaping status: Never Used  Substance Use Topics   Alcohol use: Yes    Alcohol/week: 2.0 standard drinks of alcohol  Types: 2 Glasses of wine per week    Comment: social: 1-2x/week   Drug use: Not Currently    Types: Marijuana    Comment: quit in 2018     Allergies   Peanut-containing drug products and Other   Review of Systems Review of Systems  Eyes:  Positive for pain, discharge and redness. Negative for photophobia and visual disturbance.     Physical Exam Triage Vital Signs ED Triage Vitals  Encounter Vitals Group     BP 10/29/23 1441 119/89     Systolic BP Percentile --      Diastolic BP Percentile --      Pulse Rate 10/29/23 1441 99     Resp 10/29/23 1441 18     Temp 10/29/23 1441 98.8 F (37.1 C)     Temp Source 10/29/23 1441 Oral     SpO2 10/29/23 1441 98 %     Weight 10/29/23 1442 205 lb (93 kg)     Height 10/29/23 1442 5\' 2"  (1.575 m)     Head Circumference --      Peak Flow --      Pain Score 10/29/23 1442 1     Pain Loc --      Pain Education --      Exclude from Growth Chart --    No data found.  Updated Vital Signs BP 119/89 (BP Location: Right Arm)   Pulse 99   Temp 98.8 F (37.1 C) (Oral)   Resp 18   Ht 5\' 2"  (1.575 m)   Wt 205 lb (93 kg)   LMP 10/21/2023 (Exact Date)   SpO2 98%   BMI 37.49 kg/m   Visual Acuity Right Eye Distance:   Left Eye Distance:   Bilateral Distance:    Right Eye Near:   Left Eye Near:    Bilateral Near:     Physical Exam Vitals reviewed.  Constitutional:      General: She is awake. She is not in acute distress.    Appearance: Normal appearance. She is well-developed and  well-groomed. She is not ill-appearing or toxic-appearing.  HENT:     Head: Normocephalic and atraumatic.  Eyes:     General: Lids are normal. Lids are everted, no foreign bodies appreciated. Gaze aligned appropriately.        Right eye: No foreign body, discharge or hordeolum.        Left eye: No foreign body, discharge or hordeolum.     Extraocular Movements: Extraocular movements intact.     Right eye: Normal extraocular motion and no nystagmus.     Left eye: Normal extraocular motion and no nystagmus.     Conjunctiva/sclera:     Right eye: Right conjunctiva is not injected. No chemosis, exudate or hemorrhage.    Left eye: Left conjunctiva is injected. No exudate or hemorrhage.    Pupils: Pupils are equal, round, and reactive to light.     Left eye: No corneal abrasion or fluorescein  uptake.  Pulmonary:     Effort: Pulmonary effort is normal.  Neurological:     General: No focal deficit present.     Mental Status: She is alert and oriented to person, place, and time.     GCS: GCS eye subscore is 4. GCS verbal subscore is 5. GCS motor subscore is 6.     Cranial Nerves: No cranial nerve deficit, dysarthria or facial asymmetry.  Psychiatric:        Attention and Perception: Attention and perception  normal.        Mood and Affect: Mood and affect normal.        Speech: Speech normal.        Behavior: Behavior normal. Behavior is cooperative.      UC Treatments / Results  Labs (all labs ordered are listed, but only abnormal results are displayed) Labs Reviewed - No data to display  EKG   Radiology No results found.  Procedures Procedures (including critical care time)  Medications Ordered in UC Medications - No data to display  Initial Impression / Assessment and Plan / UC Course  I have reviewed the triage vital signs and the nursing notes.  Pertinent labs & imaging results that were available during my care of the patient were reviewed by me and considered in my  medical decision making (see chart for details).      Final Clinical Impressions(s) / UC Diagnoses   Final diagnoses:  Corneal irritation of left eye  Redness of eye, left   Patient presents today with concerns of left eye redness and discomfort.  She reports that she has had drainage from the eye as well as foreign body sensation.  Fluorescein  stain was applied to the left eye and examined under [employer].  No evidence of corneal abrasion or fluorescein  uptake noted.  Eyes are PERRLA and EOMs appear to be intact.  Of note left eye is significantly injected and there does appear to be increased tearing.  Patient is a contact lens wearer.  Will send prescription in for ciprofloxacin ophthalmic solution to assist with potential infection.  Recommend instilling 1 to 2 drops into the left eye every 6 hours for 5 days.  ED and return precautions were reviewed and provided in after visit summary.  Follow-up as needed.    Discharge Instructions      You are seen today for concerns of left eye irritation after removing her contacts.  At this time I do not see evidence of a corneal abrasion or scratch but given your symptoms I would like to provide you with an antibiotic eyedrop.  I have sent in ciprofloxacin 0.3% ophthalmic solution.  Please instill 1 to 2 drops into the left eye every 6 hours for 5 days. As needed you can use lubricating eyedrops such as blink tears or refresh eyedrops to assist with grittiness or foreign body sensation.  As desired you can use sterile eye flushes to further assist with symptoms.  Please refrain from wearing your contact lenses until you are done with the eyedrop regimen.  Should you start to develop more severe eye pain, difficulty opening the eye, matting of the eye, vision changes, sensitivity to light, swelling around the eye please return here or follow-up with ophthalmology for further evaluation and ongoing management.   ED Prescriptions     Medication Sig  Dispense Auth. Provider   ciprofloxacin (CILOXAN) 0.3 % ophthalmic solution Place 1-2 drops into the left eye every 6 (six) hours for 5 days. 5 mL Braddock Servellon E, PA-C      PDMP not reviewed this encounter.   Jerona Mooring, PA-C 10/29/23 1519

## 2023-10-29 NOTE — Discharge Instructions (Signed)
 You are seen today for concerns of left eye irritation after removing her contacts.  At this time I do not see evidence of a corneal abrasion or scratch but given your symptoms I would like to provide you with an antibiotic eyedrop.  I have sent in ciprofloxacin 0.3% ophthalmic solution.  Please instill 1 to 2 drops into the left eye every 6 hours for 5 days. As needed you can use lubricating eyedrops such as blink tears or refresh eyedrops to assist with grittiness or foreign body sensation.  As desired you can use sterile eye flushes to further assist with symptoms.  Please refrain from wearing your contact lenses until you are done with the eyedrop regimen.  Should you start to develop more severe eye pain, difficulty opening the eye, matting of the eye, vision changes, sensitivity to light, swelling around the eye please return here or follow-up with ophthalmology for further evaluation and ongoing management.

## 2023-10-31 ENCOUNTER — Ambulatory Visit: Admitting: Internal Medicine

## 2023-10-31 ENCOUNTER — Ambulatory Visit: Admitting: Nurse Practitioner

## 2023-10-31 ENCOUNTER — Encounter: Payer: Self-pay | Admitting: Internal Medicine

## 2023-10-31 VITALS — BP 105/74 | HR 68 | Temp 98.2°F | Resp 15 | Ht 62.0 in | Wt 203.0 lb

## 2023-10-31 DIAGNOSIS — K625 Hemorrhage of anus and rectum: Secondary | ICD-10-CM | POA: Diagnosis not present

## 2023-10-31 DIAGNOSIS — K648 Other hemorrhoids: Secondary | ICD-10-CM

## 2023-10-31 DIAGNOSIS — K219 Gastro-esophageal reflux disease without esophagitis: Secondary | ICD-10-CM

## 2023-10-31 DIAGNOSIS — K2970 Gastritis, unspecified, without bleeding: Secondary | ICD-10-CM

## 2023-10-31 DIAGNOSIS — D509 Iron deficiency anemia, unspecified: Secondary | ICD-10-CM

## 2023-10-31 MED ORDER — HYDROCORTISONE (PERIANAL) 2.5 % EX CREA: TOPICAL_CREAM | CUTANEOUS | 1 refills | Status: AC

## 2023-10-31 MED ORDER — PANTOPRAZOLE SODIUM 40 MG PO TBEC: 40.0000 mg | DELAYED_RELEASE_TABLET | Freq: Two times a day (BID) | ORAL | 3 refills | Status: DC

## 2023-10-31 MED ORDER — SODIUM CHLORIDE 0.9 % IV SOLN: 500.0000 mL | Freq: Once | INTRAVENOUS | Status: DC

## 2023-10-31 NOTE — Progress Notes (Signed)
 Sedate, gd SR, tolerated procedure well, VSS, report to RN

## 2023-10-31 NOTE — Op Note (Signed)
 [facility] Patient Name: Jamie Lopez Procedure Date: 10/31/2023 1:18 PM MRN: [id number] Endoscopist: Jamie Lopez , , [id number] Age: 28 Referring MD:  Date of Birth: 1995/08/10 Gender: Female Account #: [id number] Procedure:                Upper GI endoscopy Indications:              Epigastric abdominal pain, Abdominal pain in the                            right upper quadrant, Iron deficiency anemia,                            Heartburn Medicines:                Monitored Anesthesia Care Procedure:                Pre-Anesthesia Assessment:                           - Prior to the procedure, a History and Physical                            was performed, and patient medications and                            allergies were reviewed. The patient's tolerance of                            previous anesthesia was also reviewed. The risks                            and benefits of the procedure and the sedation                            options and risks were discussed with the patient.                            All questions were answered, and informed consent                            was obtained. Prior Anticoagulants: The patient has                            taken no anticoagulant or antiplatelet agents. ASA                            Grade Assessment: II - A patient with mild systemic                            disease. After reviewing the risks and benefits,                            the patient was deemed in satisfactory condition to  undergo the procedure.                           After obtaining informed consent, the endoscope was                            passed under direct vision. Throughout the                            procedure, the patient's blood pressure, pulse, and                            oxygen saturations were monitored continuously. The                            Olympus Scope SN [id number] was introduced  through the                            mouth, and advanced to the second part of duodenum.                            The upper GI endoscopy was accomplished without                            difficulty. The patient tolerated the procedure                            well. Scope In: Scope Out: Findings:                 The examined esophagus was normal.                           Localized mild inflammation characterized by                            congestion (edema) and erythema was found in the                            gastric antrum. Biopsies were taken with a cold                            forceps for histology.                           The examined duodenum was normal. Biopsies were                            taken with a cold forceps for histology. Complications:            No immediate complications. Estimated Blood Loss:     Estimated blood loss was minimal. Impression:               - Normal esophagus.                           - Gastritis.  Biopsied.                           - Normal examined duodenum. Biopsied. Recommendation:           - Await pathology results.                           - Increase pantoprazole  to 40 mg twice daily.                           - Reduce NSAID use if possible.                           - Return to GI clinic in 2-3 months.                           - Perform a colonoscopy today. Dr Jamie Lopez "Jamie Lopez" Jamie Lopez,  10/31/2023 2:10:39 PM

## 2023-10-31 NOTE — Progress Notes (Signed)
 GASTROENTEROLOGY PROCEDURE H&P NOTE   Primary Care Physician: Odette Benjamin, NP    Reason for Procedure:   IDA, GERD, RUQ/epigastric ab pain  Plan:    EGD/colonoscopy  Patient is appropriate for endoscopic procedure(s) in the ambulatory ([facility]) setting.  The nature of the procedure, as well as the risks, benefits, and alternatives were carefully and thoroughly reviewed with the patient. Ample time for discussion and questions allowed. The patient understood, was satisfied, and agreed to proceed.     HPI: Jamie Lopez is a 28 y.o. female who presents for EGD/colonoscopy for evaluation of IDA, GERD, RUQ/epigastric ab pain.  Patient was most recently seen in the Gastroenterology Clinic on 09/21/23.  No interval change in medical history since that appointment. Please refer to that note for full details regarding GI history and clinical presentation.   Past Medical History:  Diagnosis Date   Allergy 04/1999   Seasonal allergies   Anxiety    Asthma    Depression 01/2013   GERD (gastroesophageal reflux disease) 07/2020   Headache(784.0)    Lupus    Rosacea    Seasonal allergies     Past Surgical History:  Procedure Laterality Date   WISDOM TOOTH EXTRACTION      Prior to Admission medications   Medication Sig Start Date End Date Taking? Authorizing Provider  buPROPion  (WELLBUTRIN  XL) 150 MG 24 hr tablet Take 1 tablet (150 mg total) by mouth daily. 04/05/23  Yes Marilou Showman, MD  buPROPion  (WELLBUTRIN  XL) 300 MG 24 hr tablet Take 1 tablet (300 mg total) by mouth every morning. 04/05/23 04/04/24 Yes Marilou Showman, MD  busPIRone  (BUSPAR ) 15 MG tablet Take 1 tablet (15 mg total) by mouth daily. 04/05/23  Yes Marilou Showman, MD  ciprofloxacin (CILOXAN) 0.3 % ophthalmic solution Place 1-2 drops into the left eye every 6 (six) hours for 5 days. 10/29/23 11/03/23 Yes Mecum, Erin E, PA-C  etonogestrel-ethinyl estradiol (NUVARING) 0.12-0.015 MG/24HR vaginal ring Place 1 each  vaginally every 28 (twenty-eight) days. Insert vaginally and leave in place for 3 consecutive weeks, then remove for 1 week.   Yes [provider]  hydroxychloroquine (PLAQUENIL) 200 MG tablet Take 200 mg by mouth 2 (two) times daily. 06/30/20  Yes [provider]  Olopatadine  HCl (PATADAY ) 0.2 % SOLN Apply 1 drop to eye daily as needed. 08/15/23  Yes McElwee, Lauren A, NP  pantoprazole  (PROTONIX ) 40 MG tablet Take 1 tablet (40 mg total) by mouth daily. 09/21/23  Yes Edmonia Gottron, PA-C  propranolol  (INDERAL ) 10 MG tablet Take 2 tablets (20 mg total) by mouth 2 (two) times daily. 10/06/23  Yes Gavin Kast, FNP  sertraline  (ZOLOFT ) 100 MG tablet TAKE 2 TABLETS BY MOUTH EVERY DAY 09/20/23  Yes McElwee, Lauren A, NP  albuterol  (VENTOLIN  HFA) 108 (90 Base) MCG/ACT inhaler Inhale 2 puffs into the lungs every 6 (six) hours as needed for wheezing or shortness of breath. 08/15/23   McElwee, Adolfo Hooker, NP  budesonide  (RHINOCORT  ALLERGY) 32 MCG/ACT nasal spray Place 1-2 sprays into both nostrils daily. 08/25/18   Benjiman Bras, MD  diclofenac (VOLTAREN) 75 MG EC tablet Take 75 mg by mouth 2 (two) times daily.    [provider]  hydrOXYzine  (VISTARIL ) 25 MG capsule Take 25-50 mg by mouth at bedtime as needed. 08/13/23   [provider]  ibuprofen  (ADVIL ) 600 MG tablet Take 1 tablet (600 mg total) by mouth every 6 (six) hours as needed. 06/28/22   Adolph Hoop, PA-C  meloxicam (MOBIC) 15 MG tablet Take 15 mg by mouth daily as needed for pain.    [provider]  naproxen  (NAPROSYN ) 500 MG tablet Take 1 tablet (500 mg total) by mouth 2 (two) times daily. 06/01/18   Kendrick, Caitlyn S, PA-C  ondansetron  (ZOFRAN -ODT) 4 MG disintegrating tablet TAKE 1 TABLET (4 MG TOTAL) BY MOUTH AS NEEDED.MAX 16MG  PER DAY 07/17/23   McElwee, Lauren A, NP  pseudoephedrine  (SUDAFED) 60 MG tablet Take 1 tablet (60 mg total) by mouth every 8 (eight) hours as needed for congestion. 06/28/22   Adolph Hoop, PA-C  rizatriptan  (MAXALT -MLT) 10 MG disintegrating tablet Take 1 tablet (10 mg total) by mouth as needed for migraine. May repeat in 2 hours if needed 01/29/21   Ulyess Gammons, NP  triamcinolone  cream (KENALOG ) 0.1 % Apply 1 application topically 2 (two) times daily. 01/02/19   Ulyess Gammons, NP  valACYclovir (VALTREX) 1000 MG tablet Take 2,000 mg by mouth 2 (two) times daily.    [provider]    Current Outpatient Medications  Medication Sig Dispense Refill   buPROPion  (WELLBUTRIN  XL) 150 MG 24 hr tablet Take 1 tablet (150 mg total) by mouth daily. 30 tablet 2   buPROPion  (WELLBUTRIN  XL) 300 MG 24 hr tablet Take 1 tablet (300 mg total) by mouth every morning. 30 tablet 2   busPIRone  (BUSPAR ) 15 MG tablet Take 1 tablet (15 mg total) by mouth daily. 30 tablet 2   ciprofloxacin (CILOXAN) 0.3 % ophthalmic solution Place 1-2 drops into the left eye every 6 (six) hours for 5 days. 5 mL 0   etonogestrel-ethinyl estradiol (NUVARING) 0.12-0.015 MG/24HR vaginal ring Place 1 each vaginally every 28 (twenty-eight) days. Insert vaginally and leave in place for 3 consecutive weeks, then remove for 1 week.     hydroxychloroquine (PLAQUENIL) 200 MG tablet Take 200 mg by mouth 2 (two) times daily.     Olopatadine  HCl (PATADAY ) 0.2 % SOLN Apply 1 drop to eye daily as needed. 2.5 mL 3   pantoprazole  (PROTONIX ) 40 MG tablet Take 1 tablet (40 mg total) by mouth daily. 30 tablet 3   propranolol  (INDERAL ) 10 MG tablet Take 2 tablets (20 mg total) by mouth 2 (two) times daily.     sertraline  (ZOLOFT ) 100 MG tablet TAKE 2 TABLETS BY MOUTH EVERY DAY 180 tablet 1   albuterol  (VENTOLIN  HFA) 108 (90 Base) MCG/ACT inhaler Inhale 2 puffs into the lungs every 6 (six) hours as needed for wheezing or shortness of breath. 8 g 6   budesonide  (RHINOCORT  ALLERGY) 32 MCG/ACT nasal spray Place 1-2 sprays into both nostrils daily. 8.6 g 6   diclofenac (VOLTAREN) 75 MG EC tablet Take 75 mg by mouth 2 (two) times  daily.     hydrOXYzine  (VISTARIL ) 25 MG capsule Take 25-50 mg by mouth at bedtime as needed.     ibuprofen  (ADVIL ) 600 MG tablet Take 1 tablet (600 mg total) by mouth every 6 (six) hours as needed. 30 tablet 0   meloxicam (MOBIC) 15 MG tablet Take 15 mg by mouth daily as needed for pain.     naproxen  (NAPROSYN ) 500 MG tablet Take 1 tablet (500 mg total) by mouth 2 (two) times daily. 30 tablet 0   ondansetron  (ZOFRAN -ODT) 4 MG disintegrating tablet TAKE 1 TABLET (4 MG TOTAL) BY MOUTH AS NEEDED.MAX 16MG  PER DAY 30 tablet 1   pseudoephedrine  (SUDAFED) 60 MG tablet Take 1 tablet (60 mg total) by mouth every 8 (eight) hours as  needed for congestion. 30 tablet 0   rizatriptan  (MAXALT -MLT) 10 MG disintegrating tablet Take 1 tablet (10 mg total) by mouth as needed for migraine. May repeat in 2 hours if needed 90 tablet 1   triamcinolone  cream (KENALOG ) 0.1 % Apply 1 application topically 2 (two) times daily. 30 g 0   valACYclovir (VALTREX) 1000 MG tablet Take 2,000 mg by mouth 2 (two) times daily.     Current Facility-Administered Medications  Medication Dose Route Frequency Provider Last Rate Last Admin   0.9 %  sodium chloride  infusion  500 mL Intravenous Once Daina Drum, MD        Allergies as of 10/31/2023 - Review Complete 10/31/2023  Allergen Reaction Noted   Other Swelling 12/20/2019   Peanut-containing drug products Hives 05/12/2015    Family History  Problem Relation Age of Onset   Anxiety disorder Mother    Diabetes Mellitus II Mother    Depression Mother    Cancer Mother        ovarian   Diabetes Mother    Miscarriages / [place] Mother    Drug abuse Father    Asthma Father    Early death Father    Anxiety disorder Maternal Grandmother    Diabetes Mellitus II Maternal Grandmother    Depression Maternal Grandmother    Hypertension Maternal Grandmother    Anxiety disorder Maternal Grandfather    Depression Maternal Grandfather    Alcohol abuse Maternal Grandfather     Drug abuse Maternal Grandfather    Arthritis Maternal Grandfather    Alcohol abuse Paternal Grandfather    Drug abuse Paternal Grandfather    Bipolar disorder Cousin    Schizophrenia Cousin     Social History   Socioeconomic History   Marital status: Single    Spouse name: Not on file   Number of children: 0   Years of education: 14   Highest education level: Master's degree (e.g., MA, MS, MEng, MEd, MSW, MBA)  Occupational History   Occupation: student    Comment: [employer]  Tobacco Use   Smoking status: Former    Current packs/day: 0.00    Types: Cigarettes, Cigars    Quit date: 2022    Years since quitting: 3.4   Smokeless tobacco: Never   Tobacco comments:    once a year or less when very stressed.  Vaping Use   Vaping status: Never Used  Substance and Sexual Activity   Alcohol use: Yes    Alcohol/week: 2.0 standard drinks of alcohol    Types: 2 Glasses of wine per week    Comment: social: 1-2x/week   Drug use: Not Currently    Types: Marijuana    Comment: quit in 2018   Sexual activity: Yes    Partners: Male    Birth control/protection: Condom, Other-see comments    Comment: NuvaRing  Other Topics Concern   Not on file  Social History Narrative   Not on file   Social Drivers of Health   Financial Resource Strain: Medium Risk (10/06/2022)   Overall Financial Resource Strain (CARDIA)    Difficulty of Paying Living Expenses: Somewhat hard  Food Insecurity: No Food Insecurity (05/11/2023)   Hunger Vital Sign    Worried About Running Out of Food in the Last Year: Never true    Ran Out of Food in the Last Year: Never true  Transportation Needs: No Transportation Needs (05/11/2023)   PRAPARE - Administrator, Civil Service (Medical): No  Lack of Transportation (Non-Medical): No  Physical Activity: Insufficiently Active (05/11/2023)   Exercise Vital Sign    Days of Exercise per Week: 2 days    Minutes of Exercise per Session: 20 min  Stress: Stress  Concern Present (05/11/2023)   [employer] of Occupational Health - Occupational Stress Questionnaire    Feeling of Stress : Very much  Social Connections: Moderately Integrated (05/11/2023)   Social Connection and Isolation Panel [NHANES]    Frequency of Communication with Friends and Family: More than three times a week    Frequency of Social Gatherings with Friends and Family: More than three times a week    Attends Religious Services: 1 to 4 times per year    Active Member of [employer] or Organizations: Yes    Attends Banker Meetings: More than 4 times per year    Marital Status: Never married  Intimate Partner Violence: Unknown (04/30/2022)   Received from [employer], [facility]   HITS    Physically Hurt: Not on file    Insult or Talk Down To: Not on file    Threaten Physical Harm: Not on file    Scream or Curse: Not on file    Physical Exam: Vital signs in last 24 hours: BP 115/70 (BP Location: Right Arm, Patient Position: Sitting, Cuff Size: Normal)   Pulse 90   Temp 98.2 F (36.8 C) (Temporal)   Ht 5\' 2"  (1.575 m)   Wt 203 lb (92.1 kg)   LMP 10/21/2023 (Exact Date)   SpO2 100%   BMI 37.13 kg/m  GEN: NAD EYE: Sclerae anicteric ENT: MMM CV: Non-tachycardic Pulm: No increased WOB GI: Soft NEURO:  Alert & Oriented   Regino Caprio, MD [facility]   10/31/2023 1:38 PM

## 2023-10-31 NOTE — Op Note (Addendum)
 [facility] Patient Name: Jamie Lopez Procedure Date: 10/31/2023 1:17 PM MRN: [id number] Endoscopist: Jamie Lopez , , [id number] Age: 28 Referring MD:  Date of Birth: 04-30-1996 Gender: Female Account #: [id number] Procedure:                Colonoscopy Indications:              Rectal bleeding, Iron deficiency anemia Medicines:                Monitored Anesthesia Care Procedure:                Pre-Anesthesia Assessment:                           - Prior to the procedure, a History and Physical                            was performed, and patient medications and                            allergies were reviewed. The patient's tolerance of                            previous anesthesia was also reviewed. The risks                            and benefits of the procedure and the sedation                            options and risks were discussed with the patient.                            All questions were answered, and informed consent                            was obtained. Prior Anticoagulants: The patient has                            taken no anticoagulant or antiplatelet agents. ASA                            Grade Assessment: II - A patient with mild systemic                            disease. After reviewing the risks and benefits,                            the patient was deemed in satisfactory condition to                            undergo the procedure.                           After obtaining informed consent, the colonoscope  was passed under direct vision. Throughout the                            procedure, the patient's blood pressure, pulse, and                            oxygen saturations were monitored continuously. The                            Olympus CF-HQ190L ([id number]) Colonoscope was                            introduced through the anus and advanced to the the                            terminal ileum.  The colonoscopy was performed                            without difficulty. The patient tolerated the                            procedure well. The quality of the bowel                            preparation was excellent. The terminal ileum,                            ileocecal valve, appendiceal orifice, and rectum                            were photographed. Scope In: 1:55:30 PM Scope Out: 2:04:56 PM Scope Withdrawal Time: 0 hours 7 minutes 2 seconds  Total Procedure Duration: 0 hours 9 minutes 26 seconds  Findings:                 The terminal ileum appeared normal.                           Non-bleeding internal hemorrhoids were found during                            retroflexion.                           The exam was otherwise without abnormality. Complications:            No immediate complications. Estimated Blood Loss:     Estimated blood loss: none. Impression:               - The examined portion of the ileum was normal.                           - Non-bleeding internal hemorrhoids.                           - The examination was otherwise normal.                           -  No specimens collected. Recommendation:           - Discharge patient to home (with escort).                           - Anusol HC cream BID for 7 days.                           - Consider daily dose of Miralax.                           - Repeat colonoscopy for screening purposes at age                            77.                           - The findings and recommendations were discussed                            with the patient. Dr Jamie Lopez "Jamie Lopez" Jamie Lopez,  10/31/2023 2:14:24 PM

## 2023-10-31 NOTE — Progress Notes (Signed)
 Called to room to assist during endoscopic procedure.  Patient ID and intended procedure confirmed with present staff. Received instructions for my participation in the procedure from the performing physician.

## 2023-10-31 NOTE — Progress Notes (Signed)
Vitals-CW  Pt's states no medical or surgical changes since previsit or office visit. 

## 2023-10-31 NOTE — Patient Instructions (Signed)
 Upper endoscopy Await pathology results Increase pantoprazole  to 40 mg twice daily; pick up prescription at your pharmacy Handout on gastritis given to patient  Colonoscopy Anusol HC cream twice a day for 7 days; pick up prescription at your pharmacy Repeat colonoscopy for screening purposes at age 28 No specimens collected    YOU HAD AN ENDOSCOPIC PROCEDURE TODAY AT THE [facility]:   Refer to the procedure report that was given to you for any specific questions about what was found during the examination.  If the procedure report does not answer your questions, please call your gastroenterologist to clarify.  If you requested that your care partner not be given the details of your procedure findings, then the procedure report has been included in a sealed envelope for you to review at your convenience later.  YOU SHOULD EXPECT: Some feelings of bloating in the abdomen. Passage of more gas than usual.  Walking can help get rid of the air that was put into your GI tract during the procedure and reduce the bloating. If you had a lower endoscopy (such as a colonoscopy or flexible sigmoidoscopy) you may notice spotting of blood in your stool or on the toilet paper. If you underwent a bowel prep for your procedure, you may not have a normal bowel movement for a few days.  Please Note:  You might notice some irritation and congestion in your nose or some drainage.  This is from the oxygen used during your procedure.  There is no need for concern and it should clear up in a day or so.  SYMPTOMS TO REPORT IMMEDIATELY:  Following lower endoscopy (colonoscopy or flexible sigmoidoscopy):  Excessive amounts of blood in the stool  Significant tenderness or worsening of abdominal pains  Swelling of the abdomen that is new, acute  Fever of 100F or higher  Following upper endoscopy (EGD)  Vomiting of blood or coffee ground material  New chest pain or pain under the shoulder  blades  Painful or persistently difficult swallowing  New shortness of breath  Fever of 100F or higher  Black, tarry-looking stools  For urgent or emergent issues, a gastroenterologist can be reached at any hour by calling [phone]. Do not use MyChart messaging for urgent concerns.    DIET:  We do recommend a small meal at first, but then you may proceed to your regular diet.  Drink plenty of fluids but you should avoid alcoholic beverages for 24 hours.  ACTIVITY:  You should plan to take it easy for the rest of today and you should NOT DRIVE or use heavy machinery until tomorrow (because of the sedation medicines used during the test).    FOLLOW UP: Our staff will call the number listed on your records the next business day following your procedure.  We will call around 7:15- 8:00 am to check on you and address any questions or concerns that you may have regarding the information given to you following your procedure. If we do not reach you, we will leave a message.     If any biopsies were taken you will be contacted by phone or by letter within the next 1-3 weeks.  Please call us  at [phone] if you have not heard about the biopsies in 3 weeks.    SIGNATURES/CONFIDENTIALITY: You and/or your care partner have signed paperwork which will be entered into your electronic medical record.  These signatures attest to the fact that that the information above on your After  Visit Summary has been reviewed and is understood.  Full responsibility of the confidentiality of this discharge information lies with you and/or your care-partner.

## 2023-11-01 ENCOUNTER — Telehealth: Payer: Self-pay

## 2023-11-01 NOTE — Telephone Encounter (Signed)
  Follow up Call-     10/31/2023    1:08 PM 10/31/2023    1:03 PM  Call back number  Post procedure Call Back phone  # [phone]   Permission to leave phone message  Yes    Patient questions:  Do you have a fever, pain , or abdominal swelling? No. Pain Score  0 *  Have you tolerated food without any problems? Yes.    Have you been able to return to your normal activities? Yes.    Do you have any questions about your discharge instructions: Diet   No. Medications  No. Follow up visit  No.  Do you have questions or concerns about your Care? No.  Actions: * If pain score is 4 or above: No action needed, pain <4.

## 2023-11-03 ENCOUNTER — Ambulatory Visit: Admitting: Nurse Practitioner

## 2023-11-03 ENCOUNTER — Ambulatory Visit: Payer: Self-pay

## 2023-11-03 ENCOUNTER — Ambulatory Visit: Payer: Self-pay | Admitting: Internal Medicine

## 2023-11-03 LAB — SURGICAL PATHOLOGY

## 2023-11-03 NOTE — Telephone Encounter (Signed)
 FYI Only or Action Required?: FYI only for provider  Patient was last seen in primary care on 10/06/2023 by Gavin Kast, FNP. Called Nurse Triage reporting Fatigue. Symptoms began several weeks ago. Interventions attempted: Rest, hydration, or home remedies. Symptoms are: unchanged.  Triage Disposition: See PCP When Office is Open (Within 3 Days)  Patient/caregiver understands and will follow disposition?: Yes  Copied from CRM [id number]. Topic: Clinical - Red Word Triage >> Nov 03, 2023  3:51 PM Jamie Lopez D wrote: Reason for EAV:[id number] fatigue - getting worse as in sleeping through multiple alarms Reason for Disposition  [1] Fatigue (i.e., tires easily, decreased energy) AND [2] persists > 1 week  Answer Assessment - Initial Assessment Questions 1. DESCRIPTION: "Describe how you are feeling."     Sleeping through alarms 2. SEVERITY: "How bad is it?"  "Can you stand and walk?"   - MILD (0-3): Feels weak or tired, but does not interfere with work, school or normal activities.   - MODERATE (4-7): Able to stand and walk; weakness interferes with work, school, or normal activities.   - SEVERE (8-10): Unable to stand or walk; unable to do usual activities.     Moderate to severe 3. ONSET: "When did these symptoms begin?" (e.g., hours, days, weeks, months)     Increased over the last two weeks 4. CAUSE: "What do you think is causing the weakness or fatigue?" (e.g., not drinking enough fluids, medical problem, trouble sleeping)     Sleeping too long, very fatigue 5. NEW MEDICINES:  "Have you started on any new medicines recently?" (e.g., opioid pain medicines, benzodiazepines, muscle relaxants, antidepressants, antihistamines, neuroleptics, beta blockers)     No new medication 6. OTHER SYMPTOMS: "Do you have any other symptoms?" (e.g., chest pain, fever, cough, SOB, vomiting, diarrhea, bleeding, other areas of pain)     Memory issues. General dizziness 7. PREGNANCY: "Is there any chance you are  pregnant?" "When was your last menstrual period?"     no  History of Lupus with iron deficiency. Patient missed her appointment today due to getting out of class late.  Protocols used: Weakness (Generalized) and Fatigue-A-AH

## 2023-11-06 ENCOUNTER — Ambulatory Visit (INDEPENDENT_AMBULATORY_CARE_PROVIDER_SITE_OTHER): Admitting: Internal Medicine

## 2023-11-06 ENCOUNTER — Encounter: Payer: Self-pay | Admitting: Internal Medicine

## 2023-11-06 VITALS — BP 114/80 | HR 77 | Temp 97.6°F | Ht 62.0 in | Wt 205.6 lb

## 2023-11-06 DIAGNOSIS — R5382 Chronic fatigue, unspecified: Secondary | ICD-10-CM

## 2023-11-06 LAB — CBC WITH DIFFERENTIAL/PLATELET
Basophils Absolute: 0 10*3/uL (ref 0.0–0.1)
Basophils Relative: 0.4 % (ref 0.0–3.0)
Eosinophils Absolute: 0 10*3/uL (ref 0.0–0.7)
Eosinophils Relative: 1.3 % (ref 0.0–5.0)
HCT: 36.6 % (ref 36.0–46.0)
Hemoglobin: 12.3 g/dL (ref 12.0–15.0)
Lymphocytes Relative: 45 % (ref 12.0–46.0)
Lymphs Abs: 1.5 10*3/uL (ref 0.7–4.0)
MCHC: 33.5 g/dL (ref 30.0–36.0)
MCV: 87 fl (ref 78.0–100.0)
Monocytes Absolute: 0.5 10*3/uL (ref 0.1–1.0)
Monocytes Relative: 14.1 % — ABNORMAL HIGH (ref 3.0–12.0)
Neutro Abs: 1.3 10*3/uL — ABNORMAL LOW (ref 1.4–7.7)
Neutrophils Relative %: 39.2 % — ABNORMAL LOW (ref 43.0–77.0)
Platelets: 343 10*3/uL (ref 150.0–400.0)
RBC: 4.2 Mil/uL (ref 3.87–5.11)
RDW: 12.9 % (ref 11.5–15.5)
WBC: 3.4 10*3/uL — ABNORMAL LOW (ref 4.0–10.5)

## 2023-11-06 NOTE — Progress Notes (Signed)
 [facility]ARE [facility] [address] [place] [address] Dept: [phone] Dept Fax: [phone]  Acute Care Office Visit  Subjective:   Jamie Lopez [birth year] 11/06/2023  Chief Complaint  Patient presents with   Fatigue    Noticed last 2 weeks     HPI:  Discussed the use of AI scribe software for clinical note transcription with the patient, who gave verbal consent to proceed.  History of Present Illness   Jamie Lopez "Jamie Lopez" is a 28 year old female with iron deficiency anemia who presents with fatigue and missed classes.  She experiences worsening significant fatigue over the last 2 weeks, leading to missed classes, including an exam. Despite getting up, she remains tired, and the fatigue is worsening. She describes sleeping through alarms and feeling increasingly tired.  She experiences confusion, dropping things, and feels like she is repeating herself. She has difficulty with concepts at school that she previously understood and has been mixing up her words.  She has a history of iron deficiency anemia, with her last iron level at 57, which is lower than desired. No rectal bleeding or blood in stool or urine. Recent endoscopy and colonoscopy revealed gastritis, but no H. pylori infection or other GI causes for anemia.   She experiences heavy menstrual periods lasting four days, with the first two days being particularly heavy, requiring changing super plus tampons every three hours. She is on NuvaRing, which has shortened the duration of her periods but not significantly reduced the heaviness.  She cannot tolerate oral iron supplements due to gastrointestinal upset and reports a craving for ice, which she associates with her iron deficiency.  She experiences nausea, worsened by spicy foods, alcohol, and some medications like BuSpar . She has difficulty tolerating liquid gel vitamin D  supplements due to nausea.  No recent  lupus flares since her last taper and no increased joint pain. Her vitamin D  was last checked in December and was low at 16. She has not had her vitamin B12 levels checked recently but recalls receiving B12 shots in the past, which were helpful.       The following portions of the patient's history were reviewed and updated as appropriate: past medical history, past surgical history, family history, social history, allergies, medications, and problem list.   Patient Active Problem List   Diagnosis Date Noted   Tinnitus of both ears 07/06/2023   Routine general medical examination at a health care facility 05/12/2023   MDD (major depressive disorder), recurrent episode, moderate (HCC) 02/21/2023   Insomnia due to other mental disorder 02/21/2023   Chest pain 10/06/2022   Lymphadenopathy 10/06/2022   Iron deficiency anemia due to chronic blood loss 04/29/2022   Dizziness 04/14/2022   Vitamin D  deficiency 04/14/2022   Gastroesophageal reflux disease 01/28/2022   Chronic fatigue 01/28/2022   Systemic lupus erythematosus (HCC) 07/16/2020   GAD (generalized anxiety disorder) 10/25/2012   Migraine 05/28/2011   Past Medical History:  Diagnosis Date   Allergy 04/1999   Seasonal allergies   Anxiety    Asthma    Depression 01/2013   GERD (gastroesophageal reflux disease) 07/2020   Headache(784.0)    Lupus    Rosacea    Seasonal allergies    Past Surgical History:  Procedure Laterality Date   WISDOM TOOTH EXTRACTION     Family History  Problem Relation Age of Onset   Anxiety disorder Mother    Diabetes Mellitus II Mother    Depression Mother  Cancer Mother        ovarian   Diabetes Mother    Miscarriages / [place] Mother    Drug abuse Father    Asthma Father    Early death Father    Anxiety disorder Maternal Grandmother    Diabetes Mellitus II Maternal Grandmother    Depression Maternal Grandmother    Hypertension Maternal Grandmother    Anxiety disorder Maternal  Grandfather    Depression Maternal Grandfather    Alcohol abuse Maternal Grandfather    Drug abuse Maternal Grandfather    Arthritis Maternal Grandfather    Alcohol abuse Paternal Grandfather    Drug abuse Paternal Grandfather    Bipolar disorder Cousin    Schizophrenia Cousin     Current Outpatient Medications:    albuterol  (VENTOLIN  HFA) 108 (90 Base) MCG/ACT inhaler, Inhale 2 puffs into the lungs every 6 (six) hours as needed for wheezing or shortness of breath., Disp: 8 g, Rfl: 6   budesonide  (RHINOCORT  ALLERGY) 32 MCG/ACT nasal spray, Place 1-2 sprays into both nostrils daily., Disp: 8.6 g, Rfl: 6   buPROPion  (WELLBUTRIN  XL) 150 MG 24 hr tablet, Take 1 tablet (150 mg total) by mouth daily., Disp: 30 tablet, Rfl: 2   buPROPion  (WELLBUTRIN  XL) 300 MG 24 hr tablet, Take 1 tablet (300 mg total) by mouth every morning., Disp: 30 tablet, Rfl: 2   busPIRone  (BUSPAR ) 15 MG tablet, Take 1 tablet (15 mg total) by mouth daily., Disp: 30 tablet, Rfl: 2   diclofenac (VOLTAREN) 75 MG EC tablet, Take 75 mg by mouth 2 (two) times daily., Disp: , Rfl:    etonogestrel-ethinyl estradiol (NUVARING) 0.12-0.015 MG/24HR vaginal ring, Place 1 each vaginally every 28 (twenty-eight) days. Insert vaginally and leave in place for 3 consecutive weeks, then remove for 1 week., Disp: , Rfl:    hydrocortisone  (ANUSOL -HC) 2.5 % rectal cream, Anusol  HC cream twice daily rectally for 7 days, Disp: 1 g, Rfl: 1   hydroxychloroquine (PLAQUENIL) 200 MG tablet, Take 200 mg by mouth 2 (two) times daily., Disp: , Rfl:    hydrOXYzine  (VISTARIL ) 25 MG capsule, Take 25-50 mg by mouth at bedtime as needed., Disp: , Rfl:    ibuprofen  (ADVIL ) 600 MG tablet, Take 1 tablet (600 mg total) by mouth every 6 (six) hours as needed., Disp: 30 tablet, Rfl: 0   meloxicam (MOBIC) 15 MG tablet, Take 15 mg by mouth daily as needed for pain., Disp: , Rfl:    naproxen  (NAPROSYN ) 500 MG tablet, Take 1 tablet (500 mg total) by mouth 2 (two) times  daily., Disp: 30 tablet, Rfl: 0   Olopatadine  HCl (PATADAY ) 0.2 % SOLN, Apply 1 drop to eye daily as needed., Disp: 2.5 mL, Rfl: 3   ondansetron  (ZOFRAN -ODT) 4 MG disintegrating tablet, TAKE 1 TABLET (4 MG TOTAL) BY MOUTH AS NEEDED.MAX 16MG  PER DAY, Disp: 30 tablet, Rfl: 1   pantoprazole  (PROTONIX ) 40 MG tablet, Take 1 tablet (40 mg total) by mouth 2 (two) times daily., Disp: 60 tablet, Rfl: 3   propranolol  (INDERAL ) 10 MG tablet, Take 2 tablets (20 mg total) by mouth 2 (two) times daily., Disp: , Rfl:    pseudoephedrine  (SUDAFED) 60 MG tablet, Take 1 tablet (60 mg total) by mouth every 8 (eight) hours as needed for congestion., Disp: 30 tablet, Rfl: 0   rizatriptan  (MAXALT -MLT) 10 MG disintegrating tablet, Take 1 tablet (10 mg total) by mouth as needed for migraine. May repeat in 2 hours if needed, Disp: 90 tablet, Rfl:  1   sertraline  (ZOLOFT ) 100 MG tablet, TAKE 2 TABLETS BY MOUTH EVERY DAY, Disp: 180 tablet, Rfl: 1   triamcinolone  cream (KENALOG ) 0.1 %, Apply 1 application topically 2 (two) times daily., Disp: 30 g, Rfl: 0   valACYclovir (VALTREX) 1000 MG tablet, Take 2,000 mg by mouth 2 (two) times daily., Disp: , Rfl:  Allergies  Allergen Reactions   Other Swelling    ALL FRUIT   Peanut-Containing Drug Products Hives    Tree nut specifically      ROS: A complete ROS was performed with pertinent positives/negatives noted in the HPI. The remainder of the ROS are negative.    Objective:   Today's Vitals   11/06/23 1028  BP: 114/80  Pulse: 77  Temp: 97.6 F (36.4 C)  TempSrc: Temporal  SpO2: 98%  Weight: 205 lb 9.6 oz (93.3 kg)  Height: 5\' 2"  (1.575 m)    GENERAL: Well-appearing, in NAD. Well nourished.  SKIN: Pink, warm and dry. No rash, lesion, ulceration, or ecchymoses.  NECK: Trachea midline. Full ROM w/o pain or tenderness. No lymphadenopathy.  RESPIRATORY: Chest wall symmetrical. Respirations even and non-labored. Breath sounds clear to auscultation bilaterally.   CARDIAC: S1, S2 present, regular rate and rhythm. Peripheral pulses 2+ bilaterally.  EXTREMITIES: Without clubbing, cyanosis, or edema.  NEUROLOGIC: No motor or sensory deficits. Steady, even gait.  PSYCH/MENTAL STATUS: Alert, oriented x 3. Cooperative, appropriate mood and affect.    No results found for any visits on 11/06/23.    Assessment & Plan:  1. Chronic fatigue (Primary) - IBC + Ferritin - B12 and Folate Panel - VITAMIN D  25 Hydroxy (Vit-D Deficiency, Fractures) - CBC with Differential/Platelet  - Variety of factors could be causing worsening fatigue lately. Advised we will check Vitamin D , B12, Folate and start supplements as necessary. Patient cannot tolerate liquid capsules of Vitamin D . If iron remains low, will refer to Hematology to initiate iron infusions.   No orders of the defined types were placed in this encounter.  Orders Placed This Encounter  Procedures   IBC + Ferritin   B12 and Folate Panel   VITAMIN D  25 Hydroxy (Vit-D Deficiency, Fractures)   CBC with Differential/Platelet   Lab Orders         IBC + Ferritin         B12 and Folate Panel         VITAMIN D  25 Hydroxy (Vit-D Deficiency, Fractures)         CBC with Differential/Platelet     No images are attached to the encounter or orders placed in the encounter.  Return for Scheduled Routine Office Visits and as needed.   Gavin Kast, FNP

## 2023-11-09 ENCOUNTER — Ambulatory Visit: Admitting: Nurse Practitioner

## 2023-11-09 DIAGNOSIS — R002 Palpitations: Secondary | ICD-10-CM

## 2023-11-10 ENCOUNTER — Ambulatory Visit: Payer: Self-pay | Admitting: Internal Medicine

## 2023-11-17 ENCOUNTER — Encounter: Payer: Self-pay | Admitting: Nurse Practitioner

## 2023-11-28 ENCOUNTER — Ambulatory Visit: Payer: Self-pay | Admitting: Internal Medicine

## 2023-12-11 ENCOUNTER — Ambulatory Visit
Admission: RE | Admit: 2023-12-11 | Discharge: 2023-12-11 | Disposition: A | Discharge status: Home / Self Care | Source: Ambulatory Visit | Attending: Family Medicine | Admitting: Family Medicine

## 2023-12-11 VITALS — BP 137/94 | HR 86 | Temp 99.6°F

## 2023-12-11 DIAGNOSIS — L249 Irritant contact dermatitis, unspecified cause: Secondary | ICD-10-CM | POA: Diagnosis not present

## 2023-12-11 MED ORDER — TRIAMCINOLONE ACETONIDE 0.1 % EX CREA: 1.0000 | TOPICAL_CREAM | Freq: Two times a day (BID) | CUTANEOUS | 0 refills | Status: AC

## 2023-12-11 MED ORDER — DEXAMETHASONE SODIUM PHOSPHATE 10 MG/ML IJ SOLN
10.0000 mg | Freq: Once | INTRAMUSCULAR | Status: AC
Start: 2023-12-11 — End: 2023-12-11
  Administered 2023-12-11: 10 mg via INTRAMUSCULAR

## 2023-12-11 NOTE — ED Triage Notes (Signed)
 Pt reports she was bit by a back ant in the right leg yesterday. Reports pain, itching swelling in the right leg. Reports she had an allergic reaction when she was a child. Pt denies any trouble breathing,. Swallowing, taking. Pt took Benadryl , Benadryl  cream yesterday; triamcinolone  cream today

## 2023-12-11 NOTE — ED Provider Notes (Signed)
 [facility]  Note:  This document was prepared using Conservation officer, historic buildings and may include unintentional dictation errors.  MRN: [id number] DOB: February 02, 1996  Subjective:   Jamie Lopez is a 28 y.o. female with PMH of lupus presenting for 1 day history of worsening allergic reaction from an ant bite.  This happened to the back and lateral side of her right thigh.  No difficulty with her breathing, swelling, fever.  Patient did use an old prescription of Benadryl  cream and has taken Benadryl  as well but is not getting any kind of relief.  Historically, she has traumatic reactions and is worried that her current reaction is going to repeat the same history.  She generally needs either steroid injection or oral steroid tapers.  No current facility-administered medications for this encounter.  Current Outpatient Medications:    albuterol  (VENTOLIN  HFA) 108 (90 Base) MCG/ACT inhaler, Inhale 2 puffs into the lungs every 6 (six) hours as needed for wheezing or shortness of breath., Disp: 8 g, Rfl: 6   budesonide  (RHINOCORT  ALLERGY) 32 MCG/ACT nasal spray, Place 1-2 sprays into both nostrils daily., Disp: 8.6 g, Rfl: 6   buPROPion  (WELLBUTRIN  XL) 150 MG 24 hr tablet, Take 1 tablet (150 mg total) by mouth daily., Disp: 30 tablet, Rfl: 2   buPROPion  (WELLBUTRIN  XL) 300 MG 24 hr tablet, Take 1 tablet (300 mg total) by mouth every morning., Disp: 30 tablet, Rfl: 2   busPIRone  (BUSPAR ) 15 MG tablet, Take 1 tablet (15 mg total) by mouth daily., Disp: 30 tablet, Rfl: 2   diclofenac (VOLTAREN) 75 MG EC tablet, Take 75 mg by mouth 2 (two) times daily., Disp: , Rfl:    etonogestrel-ethinyl estradiol (NUVARING) 0.12-0.015 MG/24HR vaginal ring, Place 1 each vaginally every 28 (twenty-eight) days. Insert vaginally and leave in place for 3 consecutive weeks, then remove for 1 week., Disp: , Rfl:    hydrocortisone  (ANUSOL -HC) 2.5 % rectal cream, Anusol  HC cream twice daily  rectally for 7 days, Disp: 1 g, Rfl: 1   hydroxychloroquine (PLAQUENIL) 200 MG tablet, Take 200 mg by mouth 2 (two) times daily., Disp: , Rfl:    hydrOXYzine  (VISTARIL ) 25 MG capsule, Take 25-50 mg by mouth at bedtime as needed., Disp: , Rfl:    ibuprofen  (ADVIL ) 600 MG tablet, Take 1 tablet (600 mg total) by mouth every 6 (six) hours as needed., Disp: 30 tablet, Rfl: 0   meloxicam (MOBIC) 15 MG tablet, Take 15 mg by mouth daily as needed for pain., Disp: , Rfl:    naproxen  (NAPROSYN ) 500 MG tablet, Take 1 tablet (500 mg total) by mouth 2 (two) times daily., Disp: 30 tablet, Rfl: 0   Olopatadine  HCl (PATADAY ) 0.2 % SOLN, Apply 1 drop to eye daily as needed., Disp: 2.5 mL, Rfl: 3   ondansetron  (ZOFRAN -ODT) 4 MG disintegrating tablet, TAKE 1 TABLET (4 MG TOTAL) BY MOUTH AS NEEDED.MAX 16MG  PER DAY, Disp: 30 tablet, Rfl: 1   pantoprazole  (PROTONIX ) 40 MG tablet, Take 1 tablet (40 mg total) by mouth 2 (two) times daily., Disp: 60 tablet, Rfl: 3   propranolol  (INDERAL ) 10 MG tablet, Take 2 tablets (20 mg total) by mouth 2 (two) times daily., Disp: , Rfl:    pseudoephedrine  (SUDAFED) 60 MG tablet, Take 1 tablet (60 mg total) by mouth every 8 (eight) hours as needed for congestion., Disp: 30 tablet, Rfl: 0   rizatriptan  (MAXALT -MLT) 10 MG disintegrating tablet, Take 1 tablet (10 mg total) by mouth as  needed for migraine. May repeat in 2 hours if needed, Disp: 90 tablet, Rfl: 1   sertraline  (ZOLOFT ) 100 MG tablet, TAKE 2 TABLETS BY MOUTH EVERY DAY, Disp: 180 tablet, Rfl: 1   triamcinolone  cream (KENALOG ) 0.1 %, Apply 1 application topically 2 (two) times daily., Disp: 30 g, Rfl: 0   valACYclovir (VALTREX) 1000 MG tablet, Take 2,000 mg by mouth 2 (two) times daily., Disp: , Rfl:    Allergies  Allergen Reactions   Other Swelling    ALL FRUIT Ants   Peanut-Containing Drug Products Hives    Tree nut specifically     Past Medical History:  Diagnosis Date   Allergy 04/1999   Seasonal allergies    Anxiety    Asthma    Depression 01/2013   GERD (gastroesophageal reflux disease) 07/2020   Headache(784.0)    Lupus    Rosacea    Seasonal allergies      Past Surgical History:  Procedure Laterality Date   WISDOM TOOTH EXTRACTION      Family History  Problem Relation Age of Onset   Anxiety disorder Mother    Diabetes Mellitus II Mother    Depression Mother    Cancer Mother        ovarian   Diabetes Mother    Miscarriages / [place] Mother    Drug abuse Father    Asthma Father    Early death Father    Anxiety disorder Maternal Grandmother    Diabetes Mellitus II Maternal Grandmother    Depression Maternal Grandmother    Hypertension Maternal Grandmother    Anxiety disorder Maternal Grandfather    Depression Maternal Grandfather    Alcohol abuse Maternal Grandfather    Drug abuse Maternal Grandfather    Arthritis Maternal Grandfather    Alcohol abuse Paternal Grandfather    Drug abuse Paternal Grandfather    Bipolar disorder Cousin    Schizophrenia Cousin     Social History   Tobacco Use   Smoking status: Never   Smokeless tobacco: Never   Tobacco comments:    once a year or less when very stressed.  Vaping Use   Vaping status: Never Used  Substance Use Topics   Alcohol use: Yes    Alcohol/week: 2.0 standard drinks of alcohol    Types: 2 Glasses of wine per week    Comment: Socially   Drug use: Never    ROS   Objective:   Vitals: BP (!) 137/94 (BP Location: Right Arm)   Pulse 86   Temp 99.6 F (37.6 C) (Oral)   LMP 12/02/2023 (Exact Date)   SpO2 98%   Physical Exam Constitutional:      General: She is not in acute distress.    Appearance: Normal appearance. She is well-developed. She is not ill-appearing, toxic-appearing or diaphoretic.  HENT:     Head: Normocephalic and atraumatic.     Nose: Nose normal.     Mouth/Throat:     Mouth: Mucous membranes are moist.  Eyes:     General: No scleral icterus.       Right eye: No discharge.         Left eye: No discharge.     Extraocular Movements: Extraocular movements intact.  Cardiovascular:     Rate and Rhythm: Normal rate.  Pulmonary:     Effort: Pulmonary effort is normal.  Skin:    General: Skin is warm and dry.      Neurological:     General: No  focal deficit present.     Mental Status: She is alert and oriented to person, place, and time.  Psychiatric:        Mood and Affect: Mood normal.        Behavior: Behavior normal.    IM dexamethasone  10 mg administered in clinic.  Assessment and Plan :   PDMP not reviewed this encounter.  1. Irritant dermatitis    Although it is a localized reaction, I was agreeable to helping patient given her report of dramatic allergic reactions.  Therefore we used IM dexamethasone  as above.  Will supplement treatment with local triamcinolone  cream.  Use hydroxyzine  as needed which the patient has at home anyhow.  Counseled patient on potential for adverse effects with medications prescribed/recommended today, ER and return-to-clinic precautions discussed, patient verbalized understanding.    Christopher Savannah, PA-C 12/11/23 1034

## 2023-12-13 ENCOUNTER — Other Ambulatory Visit (HOSPITAL_COMMUNITY): Payer: Self-pay | Admitting: Nurse Practitioner

## 2023-12-13 DIAGNOSIS — F331 Major depressive disorder, recurrent, moderate: Secondary | ICD-10-CM

## 2023-12-13 NOTE — Telephone Encounter (Signed)
 Requesting: BUPROPION  HCL XL 150 MG TABLET  Last Visit: 05/12/2023 Next Visit: Visit date not found Last Refill: 04/15/2023 patient to follow up in 1 year  Please Advise

## 2023-12-21 ENCOUNTER — Encounter: Payer: Self-pay | Admitting: Internal Medicine

## 2023-12-22 ENCOUNTER — Ambulatory Visit: Payer: Self-pay

## 2023-12-22 NOTE — Telephone Encounter (Signed)
 FYI Only or Action Required?: FYI only for provider.  Patient was last seen in primary care on 11/06/2023 by Billy Knee, FNP.  Called Nurse Triage reporting Palpitations.  Symptoms began x 3days.  Interventions attempted: Nothing.  Symptoms are: gradually worsening.  Triage Disposition: See PCP When Office is Open (Within 3 Days)  Patient/caregiver understands and will follow disposition?: Yes     Copied from CRM #[id number]. Topic: Clinical - Red Word Triage >> Dec 22, 2023  2:04 PM Knee BIRCH wrote: Reason for [id number] palpitations on and off for three days   Reason for Disposition  History of hyperthyroidism or taking thyroid  medication  Answer Assessment - Initial Assessment Questions 1. DESCRIPTION: Please describe your heart rate or heartbeat that you are having (e.g., fast/slow, regular/irregular, skipped or extra beats, palpitations)     Fast, forceful 2. ONSET: When did it start? (e.g., minutes, hours, days)      X 3 days 3. DURATION: How long does it last (e.g., seconds, minutes, hours)     na 4. PATTERN Does it come and go, or has it been constant since it started?  Does it get worse with exertion?   Are you feeling it now?     Comes and off: gradually comes more and more each day 5. TAP: Using your hand, can you tap out what you are feeling on a chair or table in front of you, so that I can hear? Note: Not all patients can do this.       na 6. HEART RATE: Can you tell me your heart rate? How many beats in 15 seconds?  Note: Not all patients can do this.       na 7. RECURRENT SYMPTOM: Have you ever had this before? If Yes, ask: When was the last time? and What happened that time?      yes 8. CAUSE: What do you think is causing the palpitations?     unknown 9. CARDIAC HISTORY: Do you have any history of heart disease? (e.g., heart attack, angina, bypass surgery, angioplasty, arrhythmia)      na 10. OTHER SYMPTOMS: Do you have any  other symptoms? (e.g., dizziness, chest pain, sweating, difficulty breathing)       no 11. PREGNANCY: Is there any chance you are pregnant? When was your last menstrual period?No  S/s worsening when patient lies down.  Pt is in school for PA therefore she did her own EKG and it showed PCA's.  Protocols used: Heart Rate and Heartbeat Questions-A-AH

## 2023-12-25 ENCOUNTER — Ambulatory Visit (INDEPENDENT_AMBULATORY_CARE_PROVIDER_SITE_OTHER): Admitting: Internal Medicine

## 2023-12-25 VITALS — BP 100/70 | HR 76 | Temp 98.2°F | Ht 62.0 in | Wt 200.0 lb

## 2023-12-25 DIAGNOSIS — F411 Generalized anxiety disorder: Secondary | ICD-10-CM | POA: Diagnosis not present

## 2023-12-25 DIAGNOSIS — D500 Iron deficiency anemia secondary to blood loss (chronic): Secondary | ICD-10-CM | POA: Diagnosis not present

## 2023-12-25 DIAGNOSIS — R002 Palpitations: Secondary | ICD-10-CM | POA: Diagnosis not present

## 2023-12-25 MED ORDER — PROPRANOLOL HCL 10 MG PO TABS: 30.0000 mg | ORAL_TABLET | Freq: Two times a day (BID) | ORAL | Status: DC

## 2023-12-25 NOTE — Progress Notes (Unsigned)
 [facility] [address] [place] [address] Dept: [phone] Dept Fax: [phone]  Acute Care Office Visit  Subjective:   Jamie Lopez 07/03/1995 12/25/2023  Chief Complaint  Patient presents with   Palpitations    Comes an goes    HPI: Discussed the use of AI scribe software for clinical note transcription with the patient, who gave verbal consent to proceed.  History of Present Illness   Jamie Lopez is a 28 year old female with anxiety and iron deficiency who presents with palpitations.  She has been experiencing palpitations that began last week, initially infrequent but increasing in frequency and intensity, particularly at night, affecting her sleep. The palpitations have kept her awake until 4 AM, impacting her daily routine as she has to wake up at 6 AM for class.  She has been taking propranolol , 20 mg twice a day, which was previously effective in managing her symptoms. Despite consistent use, the palpitations have returned. Over the weekend, she increased her dose to 60 mg per day, which provided some relief, but she remains concerned about the recurrence of symptoms.  She has a history of anxiety and has been on propranolol  since she was 16 for performance anxiety. She also takes a vitamin D  supplement and has been intolerant to oral iron supplements in the past. Recently, she has been using hydroxyzine  more frequently due to an allergic reaction to ant bites, which required a dexamethasone  injection.  She reports ongoing issues with nausea and vomiting, which have occasionally led to her vomiting medications. She uses Zofran  for nausea, but it is not always effective. She is currently under stress due to her academic commitments, which she suspects may be contributing to her symptoms.  No use of decongestants like Sudafed and no sinus problems. She is diligent in monitoring her heart rate and is  aware of her chronic iron deficiency. Her ferritin levels have been low, and her total iron binding capacity is high.        The following portions of the patient's history were reviewed and updated as appropriate: past medical history, past surgical history, family history, social history, allergies, medications, and problem list.   Patient Active Problem List   Diagnosis Date Noted   Tinnitus of both ears 07/06/2023   Routine general medical examination at a health care facility 05/12/2023   MDD (major depressive disorder), recurrent episode, moderate (HCC) 02/21/2023   Insomnia due to other mental disorder 02/21/2023   Chest pain 10/06/2022   Lymphadenopathy 10/06/2022   Iron deficiency anemia due to chronic blood loss 04/29/2022   Dizziness 04/14/2022   Vitamin D  deficiency 04/14/2022   Gastroesophageal reflux disease 01/28/2022   Chronic fatigue 01/28/2022   Systemic lupus erythematosus (HCC) 07/16/2020   GAD (generalized anxiety disorder) 10/25/2012   Migraine 05/28/2011   Past Medical History:  Diagnosis Date   Allergy 04/1999   Seasonal allergies   Anxiety 01/2013   Asthma    Depression 01/2013   GERD (gastroesophageal reflux disease) 07/2020   Headache(784.0)    Lupus    Rosacea    Seasonal allergies    Past Surgical History:  Procedure Laterality Date   WISDOM TOOTH EXTRACTION     Family History  Problem Relation Age of Onset   Anxiety disorder Mother    Diabetes Mellitus II Mother    Depression Mother    Cancer Mother        ovarian   Diabetes Mother  Miscarriages / [place] Mother    Drug abuse Father    Asthma Father    Early death Father    Anxiety disorder Maternal Grandmother    Diabetes Mellitus II Maternal Grandmother    Depression Maternal Grandmother    Hypertension Maternal Grandmother    Anxiety disorder Maternal Grandfather    Depression Maternal Grandfather    Alcohol abuse Maternal Grandfather    Drug abuse Maternal Grandfather     Arthritis Maternal Grandfather    Alcohol abuse Paternal Grandfather    Drug abuse Paternal Grandfather    Bipolar disorder Cousin    Schizophrenia Cousin     Current Outpatient Medications:    albuterol  (VENTOLIN  HFA) 108 (90 Base) MCG/ACT inhaler, Inhale 2 puffs into the lungs every 6 (six) hours as needed for wheezing or shortness of breath., Disp: 8 g, Rfl: 6   budesonide  (RHINOCORT  ALLERGY) 32 MCG/ACT nasal spray, Place 1-2 sprays into both nostrils daily., Disp: 8.6 g, Rfl: 6   buPROPion  (WELLBUTRIN  XL) 150 MG 24 hr tablet, TAKE 1 TABLET BY MOUTH EVERY DAY, Disp: 30 tablet, Rfl: 3   buPROPion  (WELLBUTRIN  XL) 300 MG 24 hr tablet, Take 1 tablet (300 mg total) by mouth every morning., Disp: 30 tablet, Rfl: 2   busPIRone  (BUSPAR ) 15 MG tablet, Take 1 tablet (15 mg total) by mouth daily., Disp: 30 tablet, Rfl: 2   diclofenac (VOLTAREN) 75 MG EC tablet, Take 75 mg by mouth 2 (two) times daily., Disp: , Rfl:    etonogestrel-ethinyl estradiol (NUVARING) 0.12-0.015 MG/24HR vaginal ring, Place 1 each vaginally every 28 (twenty-eight) days. Insert vaginally and leave in place for 3 consecutive weeks, then remove for 1 week., Disp: , Rfl:    hydrocortisone  (ANUSOL -HC) 2.5 % rectal cream, Anusol  HC cream twice daily rectally for 7 days, Disp: 1 g, Rfl: 1   hydroxychloroquine (PLAQUENIL) 200 MG tablet, Take 200 mg by mouth 2 (two) times daily., Disp: , Rfl:    hydrOXYzine  (VISTARIL ) 25 MG capsule, Take 25-50 mg by mouth at bedtime as needed., Disp: , Rfl:    ibuprofen  (ADVIL ) 600 MG tablet, Take 1 tablet (600 mg total) by mouth every 6 (six) hours as needed., Disp: 30 tablet, Rfl: 0   meloxicam (MOBIC) 15 MG tablet, Take 15 mg by mouth daily as needed for pain., Disp: , Rfl:    naproxen  (NAPROSYN ) 500 MG tablet, Take 1 tablet (500 mg total) by mouth 2 (two) times daily., Disp: 30 tablet, Rfl: 0   Olopatadine  HCl (PATADAY ) 0.2 % SOLN, Apply 1 drop to eye daily as needed., Disp: 2.5 mL, Rfl: 3    ondansetron  (ZOFRAN -ODT) 4 MG disintegrating tablet, TAKE 1 TABLET (4 MG TOTAL) BY MOUTH AS NEEDED.MAX 16MG  PER DAY, Disp: 30 tablet, Rfl: 1   pantoprazole  (PROTONIX ) 40 MG tablet, Take 1 tablet (40 mg total) by mouth 2 (two) times daily., Disp: 60 tablet, Rfl: 3   propranolol  (INDERAL ) 10 MG tablet, Take 2 tablets (20 mg total) by mouth 2 (two) times daily., Disp: , Rfl:    pseudoephedrine  (SUDAFED) 60 MG tablet, Take 1 tablet (60 mg total) by mouth every 8 (eight) hours as needed for congestion., Disp: 30 tablet, Rfl: 0   rizatriptan  (MAXALT -MLT) 10 MG disintegrating tablet, Take 1 tablet (10 mg total) by mouth as needed for migraine. May repeat in 2 hours if needed, Disp: 90 tablet, Rfl: 1   sertraline  (ZOLOFT ) 100 MG tablet, TAKE 2 TABLETS BY MOUTH EVERY DAY, Disp: 180 tablet, Rfl:  1   triamcinolone  cream (KENALOG ) 0.1 %, Apply 1 Application topically 2 (two) times daily., Disp: 45 g, Rfl: 0   valACYclovir (VALTREX) 1000 MG tablet, Take 2,000 mg by mouth 2 (two) times daily., Disp: , Rfl:  Allergies  Allergen Reactions   Other Swelling    ALL FRUIT Ants   Peanut-Containing Drug Products Hives    Tree nut specifically      ROS: A complete ROS was performed with pertinent positives/negatives noted in the HPI. The remainder of the ROS are negative.    Objective:   Today's Vitals   12/25/23 1455  BP: 100/70  Pulse: 76  Temp: 98.2 F (36.8 C)  TempSrc: Temporal  SpO2: 98%  Weight: 200 lb (90.7 kg)  Height: 5' 2 (1.575 m)    GENERAL: Well-appearing, in NAD. Well nourished.  SKIN: Pink, warm and dry. No rash, lesion, ulceration, or ecchymoses.  NECK: Trachea midline. Full ROM w/o pain or tenderness. No lymphadenopathy.  RESPIRATORY: Chest wall symmetrical. Respirations even and non-labored. Breath sounds clear to auscultation bilaterally.  CARDIAC: S1, S2 present, regular rate and rhythm. Peripheral pulses 2+ bilaterally.  EXTREMITIES: Without clubbing, cyanosis, or edema.   NEUROLOGIC: No motor or sensory deficits. Steady, even gait.  PSYCH/MENTAL STATUS: Alert, oriented x 3. Cooperative, appropriate mood and affect.    No results found for any visits on 12/25/23.    Assessment & Plan:  Assessment and Plan    Heart Palpitations Recurrent palpitations, exacerbated at night. Managed with propranolol , currently taking 20 mg twice daily, with previous increases up to 60 mg. EKG normal. Differential includes anxiety, iron deficiency anemia, stress. Discussed metoprolol for cardio-selectivity and anxiety control. - Refer to hematology for iron deficiency anemia evaluation. - Increase propranolol  to 30 mg twice daily. - Instruct her to notify if propranolol  is insufficient within two weeks. - Consider switching to metoprolol if propranolol  is insufficient.  Anxiety Anxiety potentially exacerbating palpitations, related to stress. Propranolol  used for performance anxiety. Metoprolol discussed as a cardio-selective alternative. - Consider metoprolol as an alternative if propranolol  is insufficient.  Iron Deficiency Anemia Chronic low ferritin and high TIBC, possibly contributing to palpitations. Intolerant to oral iron. Discussed iron transfusion benefits. - Refer to hematology for evaluation and potential iron transfusion.       No orders of the defined types were placed in this encounter.  No orders of the defined types were placed in this encounter.  Lab Orders  No laboratory test(s) ordered today   No images are attached to the encounter or orders placed in the encounter.  No follow-ups on file.   Rosina Senters, FNP

## 2024-01-01 ENCOUNTER — Other Ambulatory Visit: Payer: Self-pay | Admitting: Physician Assistant

## 2024-01-01 DIAGNOSIS — K2970 Gastritis, unspecified, without bleeding: Secondary | ICD-10-CM

## 2024-01-09 ENCOUNTER — Ambulatory Visit: Admitting: Internal Medicine

## 2024-01-09 ENCOUNTER — Encounter: Payer: Self-pay | Admitting: Internal Medicine

## 2024-01-09 VITALS — BP 124/80 | HR 82 | Ht 62.0 in | Wt 197.0 lb

## 2024-01-09 DIAGNOSIS — D509 Iron deficiency anemia, unspecified: Secondary | ICD-10-CM | POA: Diagnosis not present

## 2024-01-09 DIAGNOSIS — R112 Nausea with vomiting, unspecified: Secondary | ICD-10-CM

## 2024-01-09 DIAGNOSIS — E559 Vitamin D deficiency, unspecified: Secondary | ICD-10-CM | POA: Diagnosis not present

## 2024-01-09 DIAGNOSIS — K2970 Gastritis, unspecified, without bleeding: Secondary | ICD-10-CM

## 2024-01-09 DIAGNOSIS — K219 Gastro-esophageal reflux disease without esophagitis: Secondary | ICD-10-CM

## 2024-01-09 MED ORDER — VITAMIN D (ERGOCALCIFEROL) 1.25 MG (50000 UNIT) PO CAPS: 50000.0000 [IU] | ORAL_CAPSULE | ORAL | 1 refills | Status: DC

## 2024-01-09 MED ORDER — PANTOPRAZOLE SODIUM 40 MG PO TBEC: 40.0000 mg | DELAYED_RELEASE_TABLET | Freq: Two times a day (BID) | ORAL | 3 refills | Status: DC

## 2024-01-09 NOTE — Progress Notes (Signed)
 01/09/2024 Jamie Lopez [id number] 12/26/1995  Referring provider: Nedra Tinnie LABOR, NP Primary GI doctor: Dr. Federico  ASSESSMENT AND PLAN:  GERD with RUQ pain, some radiation right lower AB worse with foods/supplements Started after NSAIDS/prednisone  for lupus 10/13/2022 CT abdomen pelvis with contrast for thoracic adenopathy was unremarkable, normal liver normal gall bladder Could be medication related Carafate  did not help, prilosec 40 mg not helping, some help with pantoprazole  -Continue pantoprazole  40 mg BID. Refill -Consider RUQ U/S or GES -RTC 3 months  Alternating diarrhea/constipation with BRB in stool rare, on TP, CRP/sed rate elevated With IDA and change in stools will get colon with EGD Likely IDA is from menorrhagia but with symptoms, elevated CRP, will schedule for colonoscopy as well -Given IBS information -We have discussed the risks of bleeding, infection, perforation, medication reactions, and remote risk of death associated with colonoscopy. All questions were answered and the patient acknowledges these risk and wishes to proceed.  IDA with menorrhagia can not tolerate oral iron 05/12/2023  HGB 12.2 MCV 88.4 Platelets 367.0 05/12/2023 Iron 84 Ferritin 15 B12 769 Recent Labs    10/04/22 1039 05/12/23 1121  HGB 12.3 12.2  May benefit from IV iron Will check EGD/colon, if unremarkable likely from menses.   Vitamin D  deficiency - Start vitamin D  50,000 international units weekly  Systemic lupus x 2022 High doses of NSAIDS/prednisone , started to have burning/gnawing sensation since that time with nausea Sister with HS Now following with [place] rheum, michelle lama   Patient Care Team: Nedra Tinnie LABOR, NP as PCP - General (Internal Medicine)  HISTORY OF PRESENT ILLNESS: 28 y.o. female with a past medical history listed below presents for evaluation of GERD. 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   She has had some improvement in her N&V. She  just has occasional brekathrough N&V. Denies chest burning or regurgitation. Gnawing sesnation has improved. Zofran  ODT seems to help with symptom control. She has been taking aking pantoprazole  before breakfast and omeprazole  every day. Her nausea is not associated with her menstrual period. She is taking meloxicam and ibuprofen  PRN. Denies marijuan use or delta 8. Endorses migraines. Denies dizziness.     RELEVANT GI HISTORY, IMAGING AND LABS:  EGD 10/31/23: - Normal esophagus. - Gastritis. Biopsied. - Normal examined duodenum. Biopsied Path: 1. Surgical [P], duodenum :       -DUODENAL MUCOSA WITH NO SPECIFIC PATHOLOGIC DIAGNOSIS.       -NORMAL LONG AND SLENDER VILLI WITHOUT IDENTIFIED PATHOLOGIC INTRAEPITHELIAL       LYMPHOCYTOSIS.        2. Surgical [P], gastric :       -GASTRIC MUCOSA WITH NO SPECIFIC PATHOLOGIC DIAGNOSIS.       -NO INFLAMMATORY PATTERN PREDICTIVE OF HELICOBACTER PYLORI INFECTION IDENTIFIED.       -NEGATIVE FOR INTESTINAL METAPLASIA AND MALIGNANCY.   Colonoscopy 10/31/23: - The examined portion of the ileum was normal. - Non- bleeding internal hemorrhoids. - The examination was otherwise normal. - No specimens collected.  CBC    Component Value Date/Time   WBC 3.4 (L) 11/06/2023 1108   RBC 4.20 11/06/2023 1108   HGB 12.3 11/06/2023 1108   HGB 11.0 (L) 07/08/2020 1024   HCT 36.6 11/06/2023 1108   HCT 33.1 (L) 07/08/2020 1024   PLT 343.0 11/06/2023 1108   PLT 350 07/08/2020 1024   MCV 87.0 11/06/2023 1108   MCV 88 07/08/2020 1024   MCH 28.7 10/04/2022 1039   MCHC 33.5 11/06/2023  1108   RDW 12.9 11/06/2023 1108   RDW 13.3 07/08/2020 1024   LYMPHSABS 1.5 11/06/2023 1108   LYMPHSABS 1.5 12/23/2019 1509   MONOABS 0.5 11/06/2023 1108   EOSABS 0.0 11/06/2023 1108   EOSABS 0.1 12/23/2019 1509   BASOSABS 0.0 11/06/2023 1108   BASOSABS 0.0 12/23/2019 1509   Recent Labs    05/12/23 1121 10/06/23 1128 11/06/23 1108  HGB 12.2 12.0 12.3    CMP     Component  Value Date/Time   NA 136 10/06/2023 1128   NA 135 07/08/2020 1024   K 3.9 10/06/2023 1128   CL 107 10/06/2023 1128   CO2 22 10/06/2023 1128   GLUCOSE 82 10/06/2023 1128   BUN 9 10/06/2023 1128   BUN 11 07/08/2020 1024   CREATININE 0.75 10/06/2023 1128   CREATININE 0.69 07/13/2021 1131   CALCIUM 8.6 10/06/2023 1128   PROT 7.5 10/06/2023 1128   PROT 7.5 07/08/2020 1024   ALBUMIN 3.6 10/06/2023 1128   ALBUMIN 3.7 (L) 07/08/2020 1024   AST 20 10/06/2023 1128   ALT 23 10/06/2023 1128   ALKPHOS 56 10/06/2023 1128   BILITOT 0.3 10/06/2023 1128   BILITOT 0.3 07/08/2020 1024   GFRNONAA >60 10/04/2022 1039   GFRAA 132 07/08/2020 1024      Latest Ref Rng & Units 10/06/2023   11:28 AM 05/12/2023   11:21 AM 07/13/2021   11:31 AM  Hepatic Function  Total Protein 6.0 - 8.3 g/dL 7.5  7.5  7.4   Albumin 3.5 - 5.2 g/dL 3.6  3.7    AST 0 - 37 U/L 20  17  19    ALT 0 - 35 U/L 23  14  20    Alk Phosphatase 39 - 117 U/L 56  65    Total Bilirubin 0.2 - 1.2 mg/dL 0.3  0.4  0.3       Current Medications:   Current Outpatient Medications (Endocrine & Metabolic):    etonogestrel-ethinyl estradiol (NUVARING) 0.12-0.015 MG/24HR vaginal ring, Place 1 each vaginally every 28 (twenty-eight) days. Insert vaginally and leave in place for 3 consecutive weeks, then remove for 1 week.  Current Outpatient Medications (Cardiovascular):    propranolol  (INDERAL ) 10 MG tablet, Take 3 tablets (30 mg total) by mouth 2 (two) times daily.  Current Outpatient Medications (Respiratory):    albuterol  (VENTOLIN  HFA) 108 (90 Base) MCG/ACT inhaler, Inhale 2 puffs into the lungs every 6 (six) hours as needed for wheezing or shortness of breath.   budesonide  (RHINOCORT  ALLERGY) 32 MCG/ACT nasal spray, Place 1-2 sprays into both nostrils daily.   pseudoephedrine  (SUDAFED) 60 MG tablet, Take 1 tablet (60 mg total) by mouth every 8 (eight) hours as needed for congestion.  Current Outpatient Medications (Analgesics):     ibuprofen  (ADVIL ) 600 MG tablet, Take 1 tablet (600 mg total) by mouth every 6 (six) hours as needed.   meloxicam (MOBIC) 15 MG tablet, Take 15 mg by mouth daily as needed for pain.   rizatriptan  (MAXALT -MLT) 10 MG disintegrating tablet, Take 1 tablet (10 mg total) by mouth as needed for migraine. May repeat in 2 hours if needed   diclofenac (VOLTAREN) 75 MG EC tablet, Take 75 mg by mouth 2 (two) times daily. (Patient not taking: Reported on 01/09/2024)   naproxen  (NAPROSYN ) 500 MG tablet, Take 1 tablet (500 mg total) by mouth 2 (two) times daily.   Current Outpatient Medications (Other):    buPROPion  (WELLBUTRIN  XL) 150 MG 24 hr tablet, TAKE 1 TABLET BY  MOUTH EVERY DAY   buPROPion  (WELLBUTRIN  XL) 300 MG 24 hr tablet, Take 1 tablet (300 mg total) by mouth every morning.   busPIRone  (BUSPAR ) 15 MG tablet, Take 1 tablet (15 mg total) by mouth daily.   hydrocortisone  (ANUSOL -HC) 2.5 % rectal cream, Anusol  HC cream twice daily rectally for 7 days   hydroxychloroquine (PLAQUENIL) 200 MG tablet, Take 200 mg by mouth 2 (two) times daily.   hydrOXYzine  (VISTARIL ) 25 MG capsule, Take 25-50 mg by mouth at bedtime as needed.   Olopatadine  HCl (PATADAY ) 0.2 % SOLN, Apply 1 drop to eye daily as needed.   ondansetron  (ZOFRAN -ODT) 4 MG disintegrating tablet, TAKE 1 TABLET (4 MG TOTAL) BY MOUTH AS NEEDED.MAX 16MG  PER DAY   pantoprazole  (PROTONIX ) 40 MG tablet, TAKE 1 TABLET BY MOUTH EVERY DAY   sertraline  (ZOLOFT ) 100 MG tablet, TAKE 2 TABLETS BY MOUTH EVERY DAY   triamcinolone  cream (KENALOG ) 0.1 %, Apply 1 Application topically 2 (two) times daily.   valACYclovir (VALTREX) 1000 MG tablet, Take 2,000 mg by mouth 2 (two) times daily.  Medical History:  Past Medical History:  Diagnosis Date   Allergy 04/1999   Seasonal allergies   Anxiety 01/2013   Asthma    Depression 01/2013   GERD (gastroesophageal reflux disease) 07/2020   Headache(784.0)    Lupus    Rosacea    Seasonal allergies    Allergies:   Allergies  Allergen Reactions   Other Swelling    ALL FRUIT Ants   Peanut-Containing Drug Products Hives    Tree nut specifically      Surgical History:  She  has a past surgical history that includes Wisdom tooth extraction. Family History:  Her family history includes Alcohol abuse in her maternal grandfather and paternal grandfather; Anxiety disorder in her maternal grandfather, maternal grandmother, and mother; Arthritis in her maternal grandfather; Asthma in her father; Bipolar disorder in her cousin; Cancer in her mother; Depression in her maternal grandfather, maternal grandmother, and mother; Diabetes in her mother; Diabetes Mellitus II in her maternal grandmother and mother; Drug abuse in her father, maternal grandfather, and paternal grandfather; Early death in her father; Hypertension in her maternal grandmother; Miscarriages / [place] in her mother; Schizophrenia in her cousin.  PHYSICAL EXAM: BP 124/80   Pulse 82   Ht 5' 2 (1.575 m)   Wt 197 lb (89.4 kg)   LMP 12/02/2023 (Exact Date)   BMI 36.03 kg/m  Physical Exam          Rosario JAYSON Kidney, MD 8:58 AM

## 2024-01-09 NOTE — Patient Instructions (Addendum)
 _______________________________________________________  If your blood pressure at your visit was 140/90 or greater, please contact your primary care physician to follow up on this.  _______________________________________________________  If you are age 29 or older, your body mass index should be between 23-30. Your Body mass index is 36.03 kg/m. If this is out of the aforementioned range listed, please consider follow up with your Primary Care Provider.  If you are age 70 or younger, your body mass index should be between 19-25. Your Body mass index is 36.03 kg/m. If this is out of the aformentioned range listed, please consider follow up with your Primary Care Provider.   ________________________________________________________  The [facility] providers would like to encourage you to use MYCHART to communicate with providers for non-urgent requests or questions.  Due to long hold times on the telephone, sending your provider a message by [facility] may be a faster and more efficient way to get a response.  Please allow 48 business hours for a response.  Please remember that this is for non-urgent requests.  _______________________________________________________  [facility] is using a team-based approach to care.  Your team is made up of your doctor and two to three APPS. Our APPS (Nurse Practitioners and Physician Assistants) work with your physician to ensure care continuity for you. They are fully qualified to address your health concerns and develop a treatment plan. They communicate directly with your gastroenterologist to care for you. Seeing the Advanced Practice Practitioners on your physician's team can help you by facilitating care more promptly, often allowing for earlier appointments, access to diagnostic testing, procedures, and other specialty referrals.   We have sent the following medications to your pharmacy for you to pick up at your convenience:  Take  pantoprazole  40mg  one tablet two times daily  START: Vitamin D  Ergocalciferol  50,000 units one capsule every 7 days.  Thank you for entrusting me with your care and choosing [facility].  Dr Federico

## 2024-01-14 ENCOUNTER — Other Ambulatory Visit: Payer: Self-pay | Admitting: Internal Medicine

## 2024-01-14 DIAGNOSIS — K2970 Gastritis, unspecified, without bleeding: Secondary | ICD-10-CM

## 2024-01-16 ENCOUNTER — Other Ambulatory Visit: Payer: Self-pay | Admitting: *Deleted

## 2024-01-16 DIAGNOSIS — D500 Iron deficiency anemia secondary to blood loss (chronic): Secondary | ICD-10-CM

## 2024-01-23 ENCOUNTER — Other Ambulatory Visit

## 2024-01-23 ENCOUNTER — Inpatient Hospital Stay: Payer: Self-pay | Admitting: Oncology

## 2024-01-26 ENCOUNTER — Inpatient Hospital Stay

## 2024-01-26 ENCOUNTER — Inpatient Hospital Stay: Admitting: Nurse Practitioner

## 2024-01-31 ENCOUNTER — Telehealth: Payer: Self-pay

## 2024-01-31 DIAGNOSIS — M329 Systemic lupus erythematosus, unspecified: Secondary | ICD-10-CM

## 2024-01-31 NOTE — Telephone Encounter (Signed)
 I called and spoke with patient and notified her that a referral was placed. Thanks, BMS/CMA

## 2024-01-31 NOTE — Telephone Encounter (Signed)
 Copied from CRM [id number]. Topic: Referral - Request for Referral >> Jan 30, 2024  3:25 PM Carlyon D wrote: Did the patient discuss referral with their provider in the last year? Yes (If No - schedule appointment) (If Yes - send message)  Appointment offered? No  Type of order/referral and detailed reason for visit:   [facility] [place] Fax #:[phone]  Preference of office, provider, location: [facility]   If referral order, have you been seen by this specialty before? Yes (If Yes, this issue or another issue? When? Where?  Can we respond through MyChart? Yes

## 2024-02-07 ENCOUNTER — Encounter: Payer: Self-pay | Admitting: Internal Medicine

## 2024-02-08 ENCOUNTER — Other Ambulatory Visit (HOSPITAL_COMMUNITY): Payer: Self-pay | Admitting: Nurse Practitioner

## 2024-02-08 DIAGNOSIS — F331 Major depressive disorder, recurrent, moderate: Secondary | ICD-10-CM

## 2024-02-08 NOTE — Telephone Encounter (Signed)
 Requesting: BUPROPION  HCL XL 300 MG TABLET  Last Visit: 05/12/2023 and 12/25/23 with Billy Next Visit: Visit date not found Last Refill: 04/05/2023 by Dr. Karleen Kaufmann   Please Advise

## 2024-02-09 NOTE — Telephone Encounter (Signed)
 Left message for patient to return call.

## 2024-02-09 NOTE — Telephone Encounter (Signed)
 Copied from CRM [id number]. Topic: General - Other >> Feb 09, 2024  9:32 AM Burnard DEL wrote: Reason for CRM: Patient returned call to CMA.She would like a return call.

## 2024-02-12 NOTE — Telephone Encounter (Signed)
 I called and spoke with patient and she is no longer going to psychiatry and request that her medications are managed here.    Ok to send patient a response back via mychart

## 2024-02-13 NOTE — Progress Notes (Unsigned)
 New Hematology/Oncology Consult   Requesting MD: Rosina Senters, NP  [phone]      Reason for Consult: Iron deficiency anemia  HPI: Jamie Lopez is a 28 year old woman referred for evaluation of iron deficiency anemia.  She is unable to tolerate oral iron.  Labs completed 11/06/2023 returned with hemoglobin 12.3, MCV 87, white count 3.4, ANC 1.3, platelet count 343,000, ferritin 18, iron 79, TIBC 523, saturation 15%, B12 286.    She underwent a colonoscopy and upper endoscopy on 10/31/2023.  She had nonbleeding internal hemorrhoids, examination otherwise normal.  There was localized mild inflammation characterized by congestion and erythema in the gastric antrum; duodenum and gastric biopsies were negative.  Past medical history significant for lupus, diagnosed January 2022.  She is followed by rheumatology, currently on Plaquenil.   Past Medical History:  Diagnosis Date   Allergy 04/1999   Seasonal allergies   Anxiety 01/2013   Asthma    Depression 01/2013   GERD (gastroesophageal reflux disease) 07/2020   Headache(784.0)    Lupus    Rosacea    Seasonal allergies      Past Surgical History:  Procedure Laterality Date   WISDOM TOOTH EXTRACTION       Current Outpatient Medications:    albuterol  (VENTOLIN  HFA) 108 (90 Base) MCG/ACT inhaler, Inhale 2 puffs into the lungs every 6 (six) hours as needed for wheezing or shortness of breath., Disp: 8 g, Rfl: 6   budesonide  (RHINOCORT  ALLERGY) 32 MCG/ACT nasal spray, Place 1-2 sprays into both nostrils daily., Disp: 8.6 g, Rfl: 6   buPROPion  (WELLBUTRIN  XL) 150 MG 24 hr tablet, TAKE 1 TABLET BY MOUTH EVERY DAY, Disp: 30 tablet, Rfl: 3   buPROPion  (WELLBUTRIN  XL) 300 MG 24 hr tablet, TAKE 1 TABLET (300 MG TOTAL) BY MOUTH EVERY MORNING., Disp: 30 tablet, Rfl: 2   busPIRone  (BUSPAR ) 15 MG tablet, Take 1 tablet (15 mg total) by mouth daily., Disp: 30 tablet, Rfl: 2   diclofenac (VOLTAREN) 75 MG EC tablet, Take 75 mg by mouth 2 (two)  times daily. (Patient not taking: Reported on 01/09/2024), Disp: , Rfl:    etonogestrel-ethinyl estradiol (NUVARING) 0.12-0.015 MG/24HR vaginal ring, Place 1 each vaginally every 28 (twenty-eight) days. Insert vaginally and leave in place for 3 consecutive weeks, then remove for 1 week., Disp: , Rfl:    hydrocortisone  (ANUSOL -HC) 2.5 % rectal cream, Anusol  HC cream twice daily rectally for 7 days, Disp: 1 g, Rfl: 1   hydroxychloroquine (PLAQUENIL) 200 MG tablet, Take 200 mg by mouth 2 (two) times daily., Disp: , Rfl:    hydrOXYzine  (VISTARIL ) 25 MG capsule, Take 25-50 mg by mouth at bedtime as needed., Disp: , Rfl:    ibuprofen  (ADVIL ) 600 MG tablet, Take 1 tablet (600 mg total) by mouth every 6 (six) hours as needed., Disp: 30 tablet, Rfl: 0   meloxicam (MOBIC) 15 MG tablet, Take 15 mg by mouth daily as needed for pain., Disp: , Rfl:    naproxen  (NAPROSYN ) 500 MG tablet, Take 1 tablet (500 mg total) by mouth 2 (two) times daily., Disp: 30 tablet, Rfl: 0   Olopatadine  HCl (PATADAY ) 0.2 % SOLN, Apply 1 drop to eye daily as needed., Disp: 2.5 mL, Rfl: 3   ondansetron  (ZOFRAN -ODT) 4 MG disintegrating tablet, TAKE 1 TABLET (4 MG TOTAL) BY MOUTH AS NEEDED.MAX 16MG  PER DAY, Disp: 30 tablet, Rfl: 1   pantoprazole  (PROTONIX ) 40 MG tablet, TAKE 1 TABLET BY MOUTH TWICE A DAY, Disp: 60 tablet, Rfl: 3  propranolol  (INDERAL ) 10 MG tablet, Take 3 tablets (30 mg total) by mouth 2 (two) times daily., Disp: , Rfl:    pseudoephedrine  (SUDAFED) 60 MG tablet, Take 1 tablet (60 mg total) by mouth every 8 (eight) hours as needed for congestion., Disp: 30 tablet, Rfl: 0   rizatriptan  (MAXALT -MLT) 10 MG disintegrating tablet, Take 1 tablet (10 mg total) by mouth as needed for migraine. May repeat in 2 hours if needed, Disp: 90 tablet, Rfl: 1   sertraline  (ZOLOFT ) 100 MG tablet, TAKE 2 TABLETS BY MOUTH EVERY DAY, Disp: 180 tablet, Rfl: 1   triamcinolone  cream (KENALOG ) 0.1 %, Apply 1 Application topically 2 (two) times  daily., Disp: 45 g, Rfl: 0   valACYclovir (VALTREX) 1000 MG tablet, Take 2,000 mg by mouth 2 (two) times daily., Disp: , Rfl:    Vitamin D , Ergocalciferol , (DRISDOL ) 1.25 MG (50000 UNIT) CAPS capsule, Take 1 capsule (50,000 Units total) by mouth every 7 (seven) days., Disp: 8 capsule, Rfl: 1:     Allergies  Allergen Reactions   Other Swelling    ALL FRUIT Ants   Peanut-Containing Drug Products Hives    Tree nut specifically     FH:  SOCIAL HISTORY:  Review of Systems:  Physical Exam:  There were no vitals taken for this visit.  HEENT: *** Lungs: *** Cardiac: *** Abdomen: *** GU: ***  Vascular: *** Lymph nodes: *** Neurologic: *** Skin: *** Musculoskeletal: ***  LABS:  No results for input(s): WBC, HGB, HCT, PLT in the last 72 hours.  No results for input(s): NA, K, CL, CO2, GLUCOSE, BUN, CREATININE, CALCIUM in the last 72 hours.    RADIOLOGY:  No results found.  Assessment and Plan:   ***    Olam Ned, NP 02/13/2024, 4:44 PM

## 2024-02-14 ENCOUNTER — Inpatient Hospital Stay (HOSPITAL_BASED_OUTPATIENT_CLINIC_OR_DEPARTMENT_OTHER): Admitting: Nurse Practitioner

## 2024-02-14 ENCOUNTER — Inpatient Hospital Stay: Attending: Nurse Practitioner

## 2024-02-14 ENCOUNTER — Encounter: Payer: Self-pay | Admitting: Nurse Practitioner

## 2024-02-14 VITALS — BP 122/85 | HR 72 | Temp 97.3°F | Resp 16 | Wt 199.9 lb

## 2024-02-14 DIAGNOSIS — D500 Iron deficiency anemia secondary to blood loss (chronic): Secondary | ICD-10-CM

## 2024-02-14 DIAGNOSIS — Z87891 Personal history of nicotine dependence: Secondary | ICD-10-CM | POA: Diagnosis not present

## 2024-02-14 DIAGNOSIS — Z8049 Family history of malignant neoplasm of other genital organs: Secondary | ICD-10-CM | POA: Diagnosis not present

## 2024-02-14 DIAGNOSIS — M329 Systemic lupus erythematosus, unspecified: Secondary | ICD-10-CM

## 2024-02-14 DIAGNOSIS — D509 Iron deficiency anemia, unspecified: Secondary | ICD-10-CM | POA: Diagnosis present

## 2024-02-14 LAB — CBC WITH DIFFERENTIAL (CANCER CENTER ONLY)
Abs Immature Granulocytes: 0 K/uL (ref 0.00–0.07)
Basophils Absolute: 0 K/uL (ref 0.0–0.1)
Basophils Relative: 1 %
Eosinophils Absolute: 0 K/uL (ref 0.0–0.5)
Eosinophils Relative: 1 %
HCT: 36.3 % (ref 36.0–46.0)
Hemoglobin: 12.1 g/dL (ref 12.0–15.0)
Immature Granulocytes: 0 %
Lymphocytes Relative: 53 %
Lymphs Abs: 1.1 K/uL (ref 0.7–4.0)
MCH: 28.9 pg (ref 26.0–34.0)
MCHC: 33.3 g/dL (ref 30.0–36.0)
MCV: 86.8 fL (ref 80.0–100.0)
Monocytes Absolute: 0.3 K/uL (ref 0.1–1.0)
Monocytes Relative: 14 %
Neutro Abs: 0.7 K/uL — ABNORMAL LOW (ref 1.7–7.7)
Neutrophils Relative %: 31 %
Platelet Count: 318 K/uL (ref 150–400)
RBC: 4.18 MIL/uL (ref 3.87–5.11)
RDW: 13.2 % (ref 11.5–15.5)
WBC Count: 2.2 K/uL — ABNORMAL LOW (ref 4.0–10.5)
nRBC: 0 % (ref 0.0–0.2)

## 2024-02-14 LAB — SAVE SMEAR(SSMR), FOR PROVIDER SLIDE REVIEW

## 2024-02-14 LAB — IRON AND TIBC
Iron: 46 ug/dL (ref 28–170)
Saturation Ratios: 9 % — ABNORMAL LOW (ref 10.4–31.8)
TIBC: 494 ug/dL — ABNORMAL HIGH (ref 250–450)
UIBC: 448 ug/dL

## 2024-02-14 LAB — FERRITIN: Ferritin: 48 ng/mL (ref 11–307)

## 2024-02-23 ENCOUNTER — Ambulatory Visit (HOSPITAL_BASED_OUTPATIENT_CLINIC_OR_DEPARTMENT_OTHER): Admission: RE | Admit: 2024-02-23 | Source: Ambulatory Visit

## 2024-03-09 ENCOUNTER — Other Ambulatory Visit (HOSPITAL_COMMUNITY): Payer: Self-pay | Admitting: Nurse Practitioner

## 2024-03-09 DIAGNOSIS — F331 Major depressive disorder, recurrent, moderate: Secondary | ICD-10-CM

## 2024-03-12 NOTE — Telephone Encounter (Signed)
 Requesting: BUPROPION  HCL XL 300 MG TABLET  Last Visit: 05/12/2023 Next Visit: Visit date not found Last Refill: 02/12/2023  Please Advise   Pharmacy comment: REQUEST FOR 90 DAYS PRESCRIPTION.

## 2024-03-13 ENCOUNTER — Ambulatory Visit (INDEPENDENT_AMBULATORY_CARE_PROVIDER_SITE_OTHER)

## 2024-03-13 DIAGNOSIS — Z111 Encounter for screening for respiratory tuberculosis: Secondary | ICD-10-CM

## 2024-03-13 NOTE — Progress Notes (Signed)
 Patient is here to have a PPD placement. Denies fever, headache, medication changes, or positive TB test.   Placed on LF tolerated well no redness or swelling noted at the site.  Pt advised to RTC in 48 to 72 hrs to have read.

## 2024-03-15 ENCOUNTER — Ambulatory Visit (INDEPENDENT_AMBULATORY_CARE_PROVIDER_SITE_OTHER)

## 2024-03-15 DIAGNOSIS — Z111 Encounter for screening for respiratory tuberculosis: Secondary | ICD-10-CM

## 2024-03-15 LAB — TB SKIN TEST
Induration: 0 mm
TB Skin Test: NEGATIVE

## 2024-03-15 NOTE — Progress Notes (Signed)
 Patient is back in office to have her PPD placement results read.   Results are neg. No CXR indicated.

## 2024-03-21 ENCOUNTER — Ambulatory Visit: Payer: Self-pay | Admitting: Nurse Practitioner

## 2024-03-21 ENCOUNTER — Encounter: Payer: Self-pay | Admitting: Nurse Practitioner

## 2024-03-21 ENCOUNTER — Other Ambulatory Visit (HOSPITAL_COMMUNITY)
Admission: RE | Admit: 2024-03-21 | Discharge: 2024-03-21 | Disposition: A | Discharge status: Home / Self Care | Source: Ambulatory Visit | Attending: Nurse Practitioner | Admitting: Nurse Practitioner

## 2024-03-21 ENCOUNTER — Ambulatory Visit: Admitting: Nurse Practitioner

## 2024-03-21 VITALS — BP 110/72 | HR 78 | Temp 96.9°F | Ht 62.0 in | Wt 201.4 lb

## 2024-03-21 DIAGNOSIS — Z114 Encounter for screening for human immunodeficiency virus [HIV]: Secondary | ICD-10-CM

## 2024-03-21 DIAGNOSIS — M329 Systemic lupus erythematosus, unspecified: Secondary | ICD-10-CM

## 2024-03-21 DIAGNOSIS — D500 Iron deficiency anemia secondary to blood loss (chronic): Secondary | ICD-10-CM

## 2024-03-21 DIAGNOSIS — N898 Other specified noninflammatory disorders of vagina: Secondary | ICD-10-CM | POA: Insufficient documentation

## 2024-03-21 DIAGNOSIS — E559 Vitamin D deficiency, unspecified: Secondary | ICD-10-CM

## 2024-03-21 LAB — CBC WITH DIFFERENTIAL/PLATELET
Basophils Absolute: 0 K/uL (ref 0.0–0.1)
Basophils Relative: 0.9 % (ref 0.0–3.0)
Eosinophils Absolute: 0 K/uL (ref 0.0–0.7)
Eosinophils Relative: 0.5 % (ref 0.0–5.0)
HCT: 38.6 % (ref 36.0–46.0)
Hemoglobin: 12.8 g/dL (ref 12.0–15.0)
Lymphocytes Relative: 47.9 % — ABNORMAL HIGH (ref 12.0–46.0)
Lymphs Abs: 1.2 K/uL (ref 0.7–4.0)
MCHC: 33.1 g/dL (ref 30.0–36.0)
MCV: 88.9 fl (ref 78.0–100.0)
Monocytes Absolute: 0.4 K/uL (ref 0.1–1.0)
Monocytes Relative: 14.9 % — ABNORMAL HIGH (ref 3.0–12.0)
Neutro Abs: 0.9 K/uL — ABNORMAL LOW (ref 1.4–7.7)
Neutrophils Relative %: 35.8 % — ABNORMAL LOW (ref 43.0–77.0)
Platelets: 347 K/uL (ref 150.0–400.0)
RBC: 4.34 Mil/uL (ref 3.87–5.11)
RDW: 13.9 % (ref 11.5–15.5)
WBC: 2.5 K/uL — ABNORMAL LOW (ref 4.0–10.5)

## 2024-03-21 LAB — POCT URINALYSIS DIPSTICK
Bilirubin, UA: NEGATIVE
Blood, UA: NEGATIVE
Glucose, UA: NEGATIVE
Ketones, UA: NEGATIVE
Leukocytes, UA: NEGATIVE
Nitrite, UA: NEGATIVE
Protein, UA: POSITIVE — AB
Spec Grav, UA: 1.02 (ref 1.010–1.025)
Urobilinogen, UA: 0.2 U/dL
pH, UA: 6 (ref 5.0–8.0)

## 2024-03-21 LAB — COMPREHENSIVE METABOLIC PANEL WITH GFR
ALT: 19 U/L (ref 0–35)
AST: 19 U/L (ref 0–37)
Albumin: 3.6 g/dL (ref 3.5–5.2)
Alkaline Phosphatase: 54 U/L (ref 39–117)
BUN: 10 mg/dL (ref 6–23)
CO2: 23 meq/L (ref 19–32)
Calcium: 8.6 mg/dL (ref 8.4–10.5)
Chloride: 106 meq/L (ref 96–112)
Creatinine, Ser: 0.75 mg/dL (ref 0.40–1.20)
GFR: 108 mL/min (ref >60.00)
Glucose, Bld: 84 mg/dL (ref 70–99)
Potassium: 4.1 meq/L (ref 3.5–5.1)
Sodium: 136 meq/L (ref 135–145)
Total Bilirubin: 0.4 mg/dL (ref 0.2–1.2)
Total Protein: 7.8 g/dL (ref 6.0–8.3)

## 2024-03-21 LAB — VITAMIN B12: Vitamin B-12: 377 pg/mL (ref 211–911)

## 2024-03-21 LAB — SEDIMENTATION RATE: Sed Rate: 48 mm/h — ABNORMAL HIGH (ref 0–20)

## 2024-03-21 LAB — C-REACTIVE PROTEIN: CRP: 0.8 mg/dL (ref 0.5–20.0)

## 2024-03-21 LAB — IRON: Iron: 91 ug/dL (ref 42–145)

## 2024-03-21 LAB — VITAMIN D 25 HYDROXY (VIT D DEFICIENCY, FRACTURES): VITD: 26.37 ng/mL — ABNORMAL LOW (ref 30.00–100.00)

## 2024-03-21 LAB — FERRITIN: Ferritin: 13 ng/mL (ref 10.0–291.0)

## 2024-03-21 MED ORDER — FLUCONAZOLE 150 MG PO TABS: 150.0000 mg | ORAL_TABLET | Freq: Once | ORAL | 0 refills | Status: AC

## 2024-03-21 NOTE — Assessment & Plan Note (Signed)
Check vitamin D levels and treat based on results.

## 2024-03-21 NOTE — Progress Notes (Signed)
 Established Patient Office Visit  Subjective   Patient ID: Jamie Lopez, female    DOB: [birth year]  Age: 28 y.o. MRN: [id number]  Chief Complaint  Patient presents with   Request Labs    Patient is requesting labs, discuss having a yeast infection    HPI Discussed the use of AI scribe software for clinical note transcription with the patient, who gave verbal consent to proceed.  History of Present Illness   Jamie Lopez is a 28 year old female with lupus who presents for evaluation of a potential lupus flare and yeast infection.  She experiences fatigue and intermittent swelling, similar to a previous flare in April that required a prolonged steroid taper. She is currently taking Plaquenil and has not had a comprehensive lab workup since April. She is going to establish with a new rheumatologist in March.   She has symptoms of a yeast infection, including itching and irritation, which she associates with recent alcohol consumption. Fluconazole  provided some relief, but symptoms persist. She has recurrent yeast infections, and fluconazole  has been less effective recently. There is no discharge, and she is unsure about a bacterial vaginosis recurrence due to the absence of odor.        ROS See pertinent positives and negatives per HPI.    Objective:     BP 110/72 (BP Location: Left Arm, Patient Position: Sitting, Cuff Size: Normal)   Pulse 78   Temp (!) 96.9 F (36.1 C)   Ht 5' 2 (1.575 m)   Wt 201 lb 6.4 oz (91.4 kg)   LMP 02/12/2024 (Exact Date)   SpO2 98%   BMI 36.84 kg/m    Physical Exam Vitals and nursing note reviewed.  Constitutional:      General: She is not in acute distress.    Appearance: Normal appearance.  HENT:     Head: Normocephalic.  Eyes:     Conjunctiva/sclera: Conjunctivae normal.  Cardiovascular:     Rate and Rhythm: Normal rate and regular rhythm.     Pulses: Normal pulses.     Heart sounds: Normal heart sounds.  Pulmonary:      Effort: Pulmonary effort is normal.     Breath sounds: Normal breath sounds.  Musculoskeletal:     Cervical back: Normal range of motion.  Skin:    General: Skin is warm.  Neurological:     General: No focal deficit present.     Mental Status: She is alert and oriented to person, place, and time.  Psychiatric:        Mood and Affect: Mood normal.        Behavior: Behavior normal.        Thought Content: Thought content normal.        Judgment: Judgment normal.      Assessment & Plan:   Problem List Items Addressed This Visit       Other   Systemic lupus erythematosus (HCC) - Primary   SLE presents with recent fatigue and possible mild inflammation, though it's unclear if a full flare is occurring. Awaiting a rheumatology appointment in March. Order DSDNA, C3, C4, ESR, CRP, UA, and CMP labs. Monitor ESR and CRP levels and consider a steroid taper if elevated. Ensure an eye exam is scheduled due to Plaquenil use. Continue plaquenil 200mg  BID.       Relevant Orders   Comprehensive metabolic panel with GFR (Completed)   Sedimentation rate (Completed)   C3 and C4   C-reactive protein (Completed)  Anti-DNA antibody, double-stranded   POCT urinalysis dipstick (Completed)   Vitamin D  deficiency   Check vitamin D  levels and treat based on results.      Relevant Orders   VITAMIN D  25 Hydroxy (Vit-D Deficiency, Fractures) (Completed)   Iron deficiency anemia due to chronic blood loss   Will check CBC and iron panel today. She has not been taking her iron supplement due to reflux. Treat based on results.       Relevant Orders   CBC with Differential/Platelet (Completed)   Ferritin (Completed)   Iron (Completed)   Vitamin B12 (Completed)   Other Visit Diagnoses       Vaginal itching       Partial improvement with diflucan . Will send in second dose 150mg  and check swab for BV, yeast, gonorhrea, chlamydia today.   Relevant Orders   Cervicovaginal ancillary only      Screening for HIV (human immunodeficiency virus)       Screen HIV today   Relevant Orders   HIV Antibody (routine testing w rflx)       Return if symptoms worsen or fail to improve.    Tinnie DELENA Harada, NP

## 2024-03-21 NOTE — Assessment & Plan Note (Signed)
 SLE presents with recent fatigue and possible mild inflammation, though it's unclear if a full flare is occurring. Awaiting a rheumatology appointment in March. Order DSDNA, C3, C4, ESR, CRP, UA, and CMP labs. Monitor ESR and CRP levels and consider a steroid taper if elevated. Ensure an eye exam is scheduled due to Plaquenil use. Continue plaquenil 200mg  BID.

## 2024-03-21 NOTE — Assessment & Plan Note (Signed)
 Will check CBC and iron panel today. She has not been taking her iron supplement due to reflux. Treat based on results.

## 2024-03-21 NOTE — Patient Instructions (Signed)
 It was great to see you!  We are checking your labs today and will let you know the results via mychart/phone.   Schedule an eye exam   Let's follow-up at your next visit   Take care,  Tinnie Harada, NP

## 2024-03-22 LAB — CERVICOVAGINAL ANCILLARY ONLY
Bacterial Vaginitis (gardnerella): NEGATIVE
Candida Glabrata: NEGATIVE
Candida Vaginitis: NEGATIVE
Chlamydia: NEGATIVE
Comment: NEGATIVE
Comment: NEGATIVE
Comment: NEGATIVE
Comment: NEGATIVE
Comment: NEGATIVE
Comment: NORMAL
Neisseria Gonorrhea: NEGATIVE
Trichomonas: NEGATIVE

## 2024-03-22 LAB — HIV ANTIBODY (ROUTINE TESTING W REFLEX)
HIV 1&2 Ab, 4th Generation: NONREACTIVE
HIV FINAL INTERPRETATION: NEGATIVE

## 2024-03-22 LAB — ANTI-DNA ANTIBODY, DOUBLE-STRANDED: ds DNA Ab: 50 [IU]/mL — ABNORMAL HIGH

## 2024-03-22 LAB — C3 AND C4
C3 Complement: 156 mg/dL (ref 83–193)
C4 Complement: 18 mg/dL (ref 15–57)

## 2024-03-26 ENCOUNTER — Telehealth: Payer: Self-pay | Admitting: Family Medicine

## 2024-03-26 MED ORDER — OSELTAMIVIR PHOSPHATE 75 MG PO CAPS: 75.0000 mg | ORAL_CAPSULE | Freq: Two times a day (BID) | ORAL | 0 refills | Status: DC

## 2024-03-26 NOTE — Telephone Encounter (Signed)
 Started feeling bad today with drainage and low-grade fever.  Tested positive for flu A.  Will send over prescription for Tamiflu.  Meds ordered this encounter  Medications   oseltamivir (TAMIFLU) 75 MG capsule    Sig: Take 1 capsule (75 mg total) by mouth 2 (two) times daily.    Dispense:  10 capsule    Refill:  0

## 2024-04-04 ENCOUNTER — Encounter (INDEPENDENT_AMBULATORY_CARE_PROVIDER_SITE_OTHER): Payer: Self-pay | Admitting: Nurse Practitioner

## 2024-04-04 DIAGNOSIS — R002 Palpitations: Secondary | ICD-10-CM | POA: Diagnosis not present

## 2024-04-04 MED ORDER — METOPROLOL SUCCINATE ER 25 MG PO TB24: 25.0000 mg | ORAL_TABLET | Freq: Every day | ORAL | 1 refills | Status: DC

## 2024-04-04 NOTE — Telephone Encounter (Signed)
 Please see the MyChart message reply(ies) for my assessment and plan.  The patient gave consent for this Medical Advice Message and is aware that it may result in a bill to their insurance company as well as the possibility that this may result in a co-payment or deductible. They are an established patient, but are not seeking medical advice exclusively about a problem treated during an in person or video visit in the last 7 days. I did not recommend an in person or video visit within 7 days of my reply.  I spent a total of 7 minutes cumulative time within 7 days through MyChart messaging Tinnie DELENA Harada, NP

## 2024-04-10 MED ORDER — METOPROLOL SUCCINATE ER 50 MG PO TB24: 50.0000 mg | ORAL_TABLET | Freq: Every day | ORAL | 1 refills | Status: DC

## 2024-04-10 NOTE — Addendum Note (Signed)
 Addended by: Lilymae Swiech A on: 04/10/2024 07:58 AM   Modules accepted: Orders

## 2024-05-06 ENCOUNTER — Encounter: Payer: Self-pay | Admitting: Nurse Practitioner

## 2024-05-08 ENCOUNTER — Other Ambulatory Visit: Payer: Self-pay | Admitting: Nurse Practitioner

## 2024-05-08 NOTE — Telephone Encounter (Signed)
 Requesting: METOPROLOL  SUCC ER 50 MG TAB  Last Visit: 03/21/2024 Next Visit: 05/13/2024 Last Refill: 04/10/2024  Please Advise     Pharmacy comment: REQUEST FOR 90 DAYS PRESCRIPTION.

## 2024-05-10 ENCOUNTER — Telehealth: Payer: Self-pay

## 2024-05-10 ENCOUNTER — Other Ambulatory Visit: Payer: Self-pay | Admitting: Internal Medicine

## 2024-05-10 NOTE — Telephone Encounter (Signed)
 OV scheduled for 06/19/24 at 3:20 pm with Dr. Federico. Mychart message sent to patient & appointment letter mailed home.

## 2024-05-10 NOTE — Telephone Encounter (Signed)
-----   Message from [facility], NP sent at 05/10/2024  7:34 AM EST ----- Pod B- Patient due for follow-up with Dr. Federico or myself or Alan  Thank you  [place], NP

## 2024-05-12 ENCOUNTER — Other Ambulatory Visit (HOSPITAL_COMMUNITY): Payer: Self-pay | Admitting: Nurse Practitioner

## 2024-05-12 DIAGNOSIS — F331 Major depressive disorder, recurrent, moderate: Secondary | ICD-10-CM

## 2024-05-13 ENCOUNTER — Encounter: Payer: Self-pay | Admitting: Nurse Practitioner

## 2024-05-13 ENCOUNTER — Ambulatory Visit: Admitting: Nurse Practitioner

## 2024-05-13 VITALS — BP 104/72 | HR 86 | Temp 97.7°F | Ht 62.0 in | Wt 206.6 lb

## 2024-05-13 DIAGNOSIS — Z0001 Encounter for general adult medical examination with abnormal findings: Secondary | ICD-10-CM | POA: Diagnosis not present

## 2024-05-13 DIAGNOSIS — R002 Palpitations: Secondary | ICD-10-CM | POA: Diagnosis not present

## 2024-05-13 DIAGNOSIS — Z111 Encounter for screening for respiratory tuberculosis: Secondary | ICD-10-CM

## 2024-05-13 DIAGNOSIS — E559 Vitamin D deficiency, unspecified: Secondary | ICD-10-CM

## 2024-05-13 DIAGNOSIS — F331 Major depressive disorder, recurrent, moderate: Secondary | ICD-10-CM

## 2024-05-13 DIAGNOSIS — M329 Systemic lupus erythematosus, unspecified: Secondary | ICD-10-CM | POA: Diagnosis not present

## 2024-05-13 DIAGNOSIS — R109 Unspecified abdominal pain: Secondary | ICD-10-CM

## 2024-05-13 LAB — VITAMIN D 25 HYDROXY (VIT D DEFICIENCY, FRACTURES): VITD: 26.68 ng/mL — ABNORMAL LOW (ref 30.00–100.00)

## 2024-05-13 MED ORDER — METOPROLOL SUCCINATE ER 25 MG PO TB24: 25.0000 mg | ORAL_TABLET | Freq: Two times a day (BID) | ORAL | 1 refills | Status: AC

## 2024-05-13 MED ORDER — DICYCLOMINE HCL 10 MG PO CAPS: 10.0000 mg | ORAL_CAPSULE | Freq: Three times a day (TID) | ORAL | 0 refills | Status: DC

## 2024-05-13 NOTE — Patient Instructions (Addendum)
 It was great to see you!  We are checking your labs today and will let you know the results via mychart/phone.   Let's change your metoprolol  to 25mg  twice a day   Start bentyl  3 times a day before eating or just as needed   Let's follow-up in 3 months, sooner if you have concerns.  If a referral was placed today, you will be contacted for an appointment. Please note that routine referrals can sometimes take up to 3-4 weeks to process. Please call our office if you haven't heard anything after this time frame.  Take care,  Tinnie Harada, NP

## 2024-05-13 NOTE — Telephone Encounter (Signed)
 Requesting: BUPROPION  HCL XL 150 MG TABLET  Last Visit: 05/08/2024 Next Visit: 05/13/2024 Last Refill: 12/13/2023  Please Advise

## 2024-05-13 NOTE — Progress Notes (Unsigned)
 BP 104/72 (BP Location: Left Arm, Patient Position: Sitting, Cuff Size: Normal)   Pulse 86   Temp 97.7 F (36.5 C)   Ht 5' 2 (1.575 m)   Wt 206 lb 9.6 oz (93.7 kg)   LMP 03/26/2024 (Approximate)   SpO2 100%   BMI 37.79 kg/m    Subjective:    Patient ID: Jamie Lopez, female    DOB: 1996-04-30, 28 y.o.   MRN: [id number]  CC: Chief Complaint  Patient presents with   Annual Exam    With non-fasting labs, concerns with IBS, discuss Metoprolol     HPI: Jamie Lopez is a 28 y.o. female presenting on 05/13/2024 for comprehensive medical examination. Current medical complaints include:{Blank single:19197::none,***}  She currently lives with:  Menopausal Symptoms: no  Depression and Anxiety Screen done today and results listed below:     05/13/2024    1:34 PM 03/21/2024   12:06 PM 02/14/2024   11:00 AM 11/06/2023   10:28 AM 10/06/2023   10:25 AM  Depression screen PHQ 2/9  Decreased Interest 1 1 0 0 0  Down, Depressed, Hopeless 1 1 0 0 0  PHQ - 2 Score 2 2 0 0 0  Altered sleeping 1 2     Tired, decreased energy 1 2     Change in appetite 1 2     Feeling bad or failure about yourself  0 0     Trouble concentrating 0 1     Moving slowly or fidgety/restless 0 2     Suicidal thoughts 0 0     PHQ-9 Score 5 11      Difficult doing work/chores Not difficult at all Somewhat difficult        Data saved with a previous flowsheet row definition      05/13/2024    1:34 PM 03/21/2024   12:06 PM 05/12/2023   10:59 AM 09/09/2022   11:48 AM  GAD 7 : Generalized Anxiety Score  Nervous, Anxious, on Edge 2 3 2 2   Control/stop worrying 1 1 1 2   Worry too much - different things 1 2 2 2   Trouble relaxing 0 2 1 2   Restless 1 3 1 3   Easily annoyed or irritable 2 1 0 1  Afraid - awful might happen 0 0 0 0  Total GAD 7 Score 7 12 7 12   Anxiety Difficulty Not difficult at all Somewhat difficult Not difficult at all Very difficult    The patient does not have a history of  falls. I did not complete a risk assessment for falls. A plan of care for falls was not documented.   Past Medical History:  Past Medical History:  Diagnosis Date   Allergy 04/1999   Seasonal allergies   Anxiety 01/2013   Asthma    Depression 01/2013   GERD (gastroesophageal reflux disease) 07/2020   Headache(784.0)    Lupus    Rosacea    Seasonal allergies     Surgical History:  Past Surgical History:  Procedure Laterality Date   WISDOM TOOTH EXTRACTION      Medications:  Current Outpatient Medications on File Prior to Visit  Medication Sig   albuterol  (VENTOLIN  HFA) 108 (90 Base) MCG/ACT inhaler Inhale 2 puffs into the lungs every 6 (six) hours as needed for wheezing or shortness of breath.   budesonide  (RHINOCORT  ALLERGY) 32 MCG/ACT nasal spray Place 1-2 sprays into both nostrils daily.   buPROPion  (WELLBUTRIN  XL) 150 MG 24 hr tablet TAKE 1 TABLET  BY MOUTH EVERY DAY   buPROPion  (WELLBUTRIN  XL) 300 MG 24 hr tablet TAKE 1 TABLET (300 MG TOTAL) BY MOUTH EVERY MORNING.   busPIRone  (BUSPAR ) 15 MG tablet Take 1 tablet (15 mg total) by mouth daily.   etonogestrel-ethinyl estradiol (NUVARING) 0.12-0.015 MG/24HR vaginal ring Place 1 each vaginally every 28 (twenty-eight) days. Insert vaginally and leave in place for 3 consecutive weeks, then remove for 1 week.   hydrocortisone  (ANUSOL -HC) 2.5 % rectal cream Anusol  HC cream twice daily rectally for 7 days   hydroxychloroquine (PLAQUENIL) 200 MG tablet Take 200 mg by mouth 2 (two) times daily.   hydrOXYzine  (VISTARIL ) 25 MG capsule Take 25-50 mg by mouth at bedtime as needed.   ibuprofen  (ADVIL ) 600 MG tablet Take 1 tablet (600 mg total) by mouth every 6 (six) hours as needed.   meloxicam (MOBIC) 15 MG tablet Take 15 mg by mouth daily as needed for pain.   metoprolol  succinate (TOPROL -XL) 50 MG 24 hr tablet TAKE 1 TABLET BY MOUTH DAILY. TAKE WITH OR IMMEDIATELY FOLLOWING A MEAL.   Olopatadine  HCl (PATADAY ) 0.2 % SOLN Apply 1 drop to  eye daily as needed.   ondansetron  (ZOFRAN -ODT) 4 MG disintegrating tablet TAKE 1 TABLET (4 MG TOTAL) BY MOUTH AS NEEDED.MAX 16MG  PER DAY   pantoprazole  (PROTONIX ) 40 MG tablet TAKE 1 TABLET BY MOUTH TWICE A DAY   pseudoephedrine  (SUDAFED) 60 MG tablet Take 1 tablet (60 mg total) by mouth every 8 (eight) hours as needed for congestion.   rizatriptan  (MAXALT -MLT) 10 MG disintegrating tablet Take 1 tablet (10 mg total) by mouth as needed for migraine. May repeat in 2 hours if needed   sertraline  (ZOLOFT ) 100 MG tablet TAKE 2 TABLETS BY MOUTH EVERY DAY   triamcinolone  cream (KENALOG ) 0.1 % Apply 1 Application topically 2 (two) times daily.   valACYclovir (VALTREX) 1000 MG tablet Take 2,000 mg by mouth 2 (two) times daily.   Vitamin D , Ergocalciferol , (DRISDOL ) 1.25 MG (50000 UNIT) CAPS capsule TAKE 1 CAPSULE (50,000 UNITS TOTAL) BY MOUTH EVERY 7 (SEVEN) DAYS   diclofenac (VOLTAREN) 75 MG EC tablet Take 75 mg by mouth 2 (two) times daily. (Patient not taking: Reported on 05/13/2024)   oseltamivir  (TAMIFLU ) 75 MG capsule Take 1 capsule (75 mg total) by mouth 2 (two) times daily. (Patient not taking: Reported on 05/13/2024)   No current facility-administered medications on file prior to visit.    Allergies:  Allergies[1]  Social History:  Social History   Socioeconomic History   Marital status: Single    Spouse name: Not on file   Number of children: 0   Years of education: 14   Highest education level: Master's degree (e.g., MA, MS, MEng, MEd, MSW, MBA)  Occupational History   Occupation: student    Comment: [employer]  Tobacco Use   Smoking status: Former    Current packs/day: 0.00    Types: Cigarettes, Cigars    Quit date: 05/30/2020    Years since quitting: 3.9   Smokeless tobacco: Never   Tobacco comments:    Smoker on high stress situations  Vaping Use   Vaping status: Never Used  Substance and Sexual Activity   Alcohol use: Yes    Alcohol/week: 2.0 standard drinks of alcohol     Types: 2 Glasses of wine per week    Comment: Social drinker   Drug use: Not Currently    Types: Marijuana   Sexual activity: Yes    Partners: Male  Birth control/protection: Condom, Other-see comments    Comment: NuvaRing  Other Topics Concern   Not on file  Social History Narrative   Not on file   Social Drivers of Health   Tobacco Use: Medium Risk (05/13/2024)   Patient History    Smoking Tobacco Use: Former    Smokeless Tobacco Use: Never    Passive Exposure: Not on file  Financial Resource Strain: Low Risk (03/20/2024)   Overall Financial Resource Strain (CARDIA)    Difficulty of Paying Living Expenses: Not very hard  Food Insecurity: No Food Insecurity (03/20/2024)   Epic    Worried About Radiation Protection Practitioner of Food in the Last Year: Never true    Ran Out of Food in the Last Year: Never true  Transportation Needs: No Transportation Needs (03/20/2024)   Epic    Lack of Transportation (Medical): No    Lack of Transportation (Non-Medical): No  Physical Activity: Insufficiently Active (03/20/2024)   Exercise Vital Sign    Days of Exercise per Week: 3 days    Minutes of Exercise per Session: 20 min  Stress: Stress Concern Present (03/20/2024)   [employer] of Occupational Health - Occupational Stress Questionnaire    Feeling of Stress: To some extent  Social Connections: Moderately Integrated (03/20/2024)   Social Connection and Isolation Panel    Frequency of Communication with Friends and Family: More than three times a week    Frequency of Social Gatherings with Friends and Family: More than three times a week    Attends Religious Services: More than 4 times per year    Active Member of Clubs or Organizations: Yes    Attends Banker Meetings: More than 4 times per year    Marital Status: Never married  Intimate Partner Violence: Not At Risk (02/14/2024)   Epic    Fear of Current or Ex-Partner: No    Emotionally Abused: No    Physically Abused: No     Sexually Abused: No  Depression (PHQ2-9): Medium Risk (05/13/2024)   Depression (PHQ2-9)    PHQ-2 Score: 5  Alcohol Screen: Low Risk (03/20/2024)   Alcohol Screen    Last Alcohol Screening Score (AUDIT): 6  Housing: Low Risk (03/20/2024)   Epic    Unable to Pay for Housing in the Last Year: No    Number of Times Moved in the Last Year: 1    Homeless in the Last Year: No  Utilities: Not At Risk (02/14/2024)   Epic    Threatened with loss of utilities: No  Health Literacy: Adequate Health Literacy (02/14/2024)   B1300 Health Literacy    Frequency of need for help with medical instructions: Never   Tobacco Use History[2] Social History   Substance and Sexual Activity  Alcohol Use Yes   Alcohol/week: 2.0 standard drinks of alcohol   Types: 2 Glasses of wine per week   Comment: Social drinker    Family History:  Family History  Problem Relation Age of Onset   Anxiety disorder Mother    Diabetes Mellitus II Mother    Depression Mother    Cancer Mother        ovarian   Diabetes Mother    Miscarriages / Stillbirths Mother    Drug abuse Father    Asthma Father    Early death Father    Anxiety disorder Maternal Grandmother    Diabetes Mellitus II Maternal Grandmother    Depression Maternal Grandmother    Hypertension Maternal Grandmother  Anxiety disorder Maternal Grandfather    Depression Maternal Grandfather    Alcohol abuse Maternal Grandfather    Drug abuse Maternal Grandfather    Arthritis Maternal Grandfather    Alcohol abuse Paternal Grandfather    Drug abuse Paternal Grandfather    Bipolar disorder Cousin    Schizophrenia Cousin     Past medical history, surgical history, medications, allergies, family history and social history reviewed with patient today and changes made to appropriate areas of the chart.   ROS All other ROS negative except what is listed above and in the HPI.      Objective:    BP 104/72 (BP Location: Left Arm, Patient Position:  Sitting, Cuff Size: Normal)   Pulse 86   Temp 97.7 F (36.5 C)   Ht 5' 2 (1.575 m)   Wt 206 lb 9.6 oz (93.7 kg)   LMP 03/26/2024 (Approximate)   SpO2 100%   BMI 37.79 kg/m   Wt Readings from Last 3 Encounters:  05/13/24 206 lb 9.6 oz (93.7 kg)  03/21/24 201 lb 6.4 oz (91.4 kg)  02/14/24 199 lb 14.4 oz (90.7 kg)    Physical Exam  Results for orders placed or performed in visit on 05/06/24  HM PAP SMEAR   Collection Time: 04/11/23 12:00 AM  Result Value Ref Range   HM Pap smear WNL       Assessment & Plan:   Problem List Items Addressed This Visit   None    Follow up plan: No follow-ups on file.   LABORATORY TESTING:  - Pap smear: up to date  IMMUNIZATIONS:   - Tdap: Tetanus vaccination status reviewed: last tetanus booster within 10 years. - Influenza: Up to date - Pneumovax: Not applicable - Prevnar: Not applicable - HPV: Declined - Shingrix vaccine: Not applicable  SCREENING: -Mammogram: Not applicable  - Colonoscopy: Not applicable  - Bone Density: Not applicable   PATIENT COUNSELING:   Advised to take 1 mg of folate supplement per day if capable of pregnancy.   Sexuality: Discussed sexually transmitted diseases, partner selection, use of condoms, avoidance of unintended pregnancy  and contraceptive alternatives.   Advised to avoid cigarette smoking.  I discussed with the patient that most people either abstain from alcohol or drink within safe limits (<=14/week and <=4 drinks/occasion for males, <=7/weeks and <= 3 drinks/occasion for females) and that the risk for alcohol disorders and other health effects rises proportionally with the number of drinks per week and how often a drinker exceeds daily limits.  Discussed cessation/primary prevention of drug use and availability of treatment for abuse.   Diet: Encouraged to adjust caloric intake to maintain  or achieve ideal body weight, to reduce intake of dietary saturated fat and total fat, to limit  sodium intake by avoiding high sodium foods and not adding table salt, and to maintain adequate dietary potassium and calcium preferably from fresh fruits, vegetables, and low-fat dairy products.    stressed the importance of regular exercise  Injury prevention: Discussed safety belts, safety helmets, smoke detector, smoking near bedding or upholstery.   Dental health: Discussed importance of regular tooth brushing, flossing, and dental visits.    NEXT PREVENTATIVE PHYSICAL DUE IN 1 YEAR. No follow-ups on file.  Menashe Kafer A Kirklin Mcduffee    [1]  Allergies Allergen Reactions   Other Swelling    ALL FRUIT Ants   Peanut-Containing Drug Products Hives    Tree nut specifically   [2]  Social History Tobacco Use  Smoking  Status Former   Current packs/day: 0.00   Types: Cigarettes, Cigars   Quit date: 05/30/2020   Years since quitting: 3.9  Smokeless Tobacco Never  Tobacco Comments   Smoker on high stress situations

## 2024-05-14 ENCOUNTER — Ambulatory Visit: Payer: Self-pay | Admitting: Nurse Practitioner

## 2024-05-14 DIAGNOSIS — R002 Palpitations: Secondary | ICD-10-CM | POA: Insufficient documentation

## 2024-05-14 DIAGNOSIS — R109 Unspecified abdominal pain: Secondary | ICD-10-CM | POA: Insufficient documentation

## 2024-05-14 NOTE — Assessment & Plan Note (Signed)
 Chronic, stable. Will continue wellbutrin  450mg  daily, buspar  15mg  daily, and zoloft  200mg  daily. She does not need refills today.

## 2024-05-14 NOTE — Assessment & Plan Note (Signed)
 Chronic, stable. Awaiting a rheumatology appointment in March. Ensure an eye exam is scheduled due to Plaquenil use. Continue plaquenil 200mg  BID.

## 2024-05-14 NOTE — Assessment & Plan Note (Signed)
 May be related to stress and recent change in diet due to school schedule. Continue keeping food log. Start bentyl  10mg  TID prn. Follow-up with any worsening symptoms or concerns.

## 2024-05-14 NOTE — Assessment & Plan Note (Signed)
 Chronic, not controlled. Palpitations are still occurring at night. She would like to split the dose to take twice a day to see if this helps. Will have her take metoprolol  25mg  BID.

## 2024-05-14 NOTE — Assessment & Plan Note (Signed)
Check vitamin D levels and treat based on results.

## 2024-05-15 LAB — QUANTIFERON-TB GOLD PLUS
Mitogen-NIL: 4.66 [IU]/mL
NIL: 0.02 [IU]/mL
QuantiFERON-TB Gold Plus: NEGATIVE
TB1-NIL: 0 [IU]/mL
TB2-NIL: 0 [IU]/mL

## 2024-05-24 ENCOUNTER — Ambulatory Visit
Admission: RE | Admit: 2024-05-24 | Discharge: 2024-05-24 | Disposition: A | Discharge status: Home / Self Care | Payer: Self-pay | Attending: Nurse Practitioner | Admitting: Nurse Practitioner

## 2024-05-24 ENCOUNTER — Other Ambulatory Visit: Payer: Self-pay

## 2024-05-24 VITALS — BP 115/83 | HR 70 | Temp 98.5°F | Resp 18 | Ht 62.0 in | Wt 205.0 lb

## 2024-05-24 DIAGNOSIS — H9202 Otalgia, left ear: Secondary | ICD-10-CM | POA: Diagnosis not present

## 2024-05-24 NOTE — ED Triage Notes (Addendum)
 Pt presents with a chief complaint of left ear pain that began yesterday. Did have an Airpod in the ear at the time. Pain has continued into today. Currently rates left ear pain a 4/10. OTC ear ache drops applied to the ear yesterday, minimal to no improvement. Also took Ibuprofen  + Tylenol .

## 2024-05-24 NOTE — ED Provider Notes (Signed)
 " [facility]    CSN: [id number] Arrival date & time: 05/24/24  1147      History   Chief Complaint Chief Complaint  Patient presents with   Otalgia    HPI Maui Korus is a 28 y.o. female.   HPI  Pt is here today with concern for left ear pain that started suddenly yesterday, 12/25, and has been persistent and constant since then.She describes it as throbbing. She reports her ear pain is a 3/10 and feels achy and full.  She denies notable hearing loss or changes on the affected side, discharge or bleeding. She reports a mildly scratchy throat and some nasal congestion and rhinorrhea. She denies similar symptoms in others in household but she states yesterday she was around a cousin who was sick.  Interventions: ibuprofen  and tylenol  yesterday night   Past Medical History:  Diagnosis Date   Allergy 04/1999   Seasonal allergies   Anxiety 01/2013   Asthma    Depression 01/2013   GERD (gastroesophageal reflux disease) 07/2020   Headache(784.0)    Lupus    Rosacea    Seasonal allergies     Patient Active Problem List   Diagnosis Date Noted   Palpitations 05/14/2024   Abdominal cramping 05/14/2024   Tinnitus of both ears 07/06/2023   Routine general medical examination at a health care facility 05/12/2023   MDD (major depressive disorder), recurrent episode, moderate (HCC) 02/21/2023   Insomnia due to other mental disorder 02/21/2023   Chest pain 10/06/2022   Lymphadenopathy 10/06/2022   Iron deficiency anemia due to chronic blood loss 04/29/2022   Dizziness 04/14/2022   Vitamin D  deficiency 04/14/2022   Gastroesophageal reflux disease 01/28/2022   Chronic fatigue 01/28/2022   Systemic lupus erythematosus (HCC) 07/16/2020   GAD (generalized anxiety disorder) 10/25/2012   Migraine 05/28/2011    Past Surgical History:  Procedure Laterality Date   WISDOM TOOTH EXTRACTION      OB History   No obstetric history on file.      Home Medications     Prior to Admission medications  Medication Sig Start Date End Date Taking? Authorizing Provider  albuterol  (VENTOLIN  HFA) 108 (90 Base) MCG/ACT inhaler Inhale 2 puffs into the lungs every 6 (six) hours as needed for wheezing or shortness of breath. 08/15/23   McElwee, Tinnie LABOR, NP  budesonide  (RHINOCORT  ALLERGY) 32 MCG/ACT nasal spray Place 1-2 sprays into both nostrils daily. 08/25/18   Levora Reyes SAUNDERS, MD  buPROPion  (WELLBUTRIN  XL) 150 MG 24 hr tablet TAKE 1 TABLET BY MOUTH EVERY DAY 05/13/24   McElwee, Lauren A, NP  buPROPion  (WELLBUTRIN  XL) 300 MG 24 hr tablet TAKE 1 TABLET (300 MG TOTAL) BY MOUTH EVERY MORNING. 03/12/24 03/12/25  McElwee, Lauren A, NP  busPIRone  (BUSPAR ) 15 MG tablet Take 1 tablet (15 mg total) by mouth daily. 04/05/23   Marry Clamp, MD  diclofenac (VOLTAREN) 75 MG EC tablet Take 75 mg by mouth 2 (two) times daily. Patient not taking: Reported on 05/13/2024    [provider]  dicyclomine  (BENTYL ) 10 MG capsule Take 1 capsule (10 mg total) by mouth 4 (four) times daily -  before meals and at bedtime. 05/13/24   McElwee, Tinnie LABOR, NP  etonogestrel-ethinyl estradiol (NUVARING) 0.12-0.015 MG/24HR vaginal ring Place 1 each vaginally every 28 (twenty-eight) days. Insert vaginally and leave in place for 3 consecutive weeks, then remove for 1 week.    [provider]  hydrocortisone  (ANUSOL -HC) 2.5 % rectal cream Anusol   HC cream twice daily rectally for 7 days 10/31/23   Federico Rosario BROCKS, MD  hydroxychloroquine (PLAQUENIL) 200 MG tablet Take 200 mg by mouth 2 (two) times daily. 06/30/20   [provider]  hydrOXYzine  (VISTARIL ) 25 MG capsule Take 25-50 mg by mouth at bedtime as needed. 08/13/23   [provider]  ibuprofen  (ADVIL ) 600 MG tablet Take 1 tablet (600 mg total) by mouth every 6 (six) hours as needed. 06/28/22   Christopher Savannah, PA-C  meloxicam (MOBIC) 15 MG tablet Take 15 mg by mouth daily as needed for pain.    [provider]   metoprolol  succinate (TOPROL -XL) 25 MG 24 hr tablet Take 1 tablet (25 mg total) by mouth in the morning and at bedtime. 05/13/24   McElwee, Lauren A, NP  Olopatadine  HCl (PATADAY ) 0.2 % SOLN Apply 1 drop to eye daily as needed. 08/15/23   McElwee, Lauren A, NP  ondansetron  (ZOFRAN -ODT) 4 MG disintegrating tablet TAKE 1 TABLET (4 MG TOTAL) BY MOUTH AS NEEDED.MAX 16MG  PER DAY 07/17/23   McElwee, Lauren A, NP  pantoprazole  (PROTONIX ) 40 MG tablet TAKE 1 TABLET BY MOUTH TWICE A DAY 01/15/24   Federico Rosario BROCKS, MD  pseudoephedrine  (SUDAFED) 60 MG tablet Take 1 tablet (60 mg total) by mouth every 8 (eight) hours as needed for congestion. 06/28/22   Christopher Savannah, PA-C  rizatriptan  (MAXALT -MLT) 10 MG disintegrating tablet Take 1 tablet (10 mg total) by mouth as needed for migraine. May repeat in 2 hours if needed 01/29/21   Kip Ade, NP  sertraline  (ZOLOFT ) 100 MG tablet TAKE 2 TABLETS BY MOUTH EVERY DAY 09/20/23   McElwee, Lauren A, NP  triamcinolone  cream (KENALOG ) 0.1 % Apply 1 Application topically 2 (two) times daily. 12/11/23   Christopher Savannah, PA-C  valACYclovir (VALTREX) 1000 MG tablet Take 2,000 mg by mouth 2 (two) times daily.    [provider]  Vitamin D , Ergocalciferol , (DRISDOL ) 1.25 MG (50000 UNIT) CAPS capsule TAKE 1 CAPSULE (50,000 UNITS TOTAL) BY MOUTH EVERY 7 (SEVEN) DAYS 05/10/24   May, Deanna J, NP    Family History Family History  Problem Relation Age of Onset   Anxiety disorder Mother    Diabetes Mellitus II Mother    Depression Mother    Cancer Mother        ovarian   Diabetes Mother    Miscarriages / Stillbirths Mother    Drug abuse Father    Asthma Father    Early death Father    Anxiety disorder Maternal Grandmother    Diabetes Mellitus II Maternal Grandmother    Depression Maternal Grandmother    Hypertension Maternal Grandmother    Anxiety disorder Maternal Grandfather    Depression Maternal Grandfather    Alcohol abuse Maternal Grandfather    Drug abuse  Maternal Grandfather    Arthritis Maternal Grandfather    Alcohol abuse Paternal Grandfather    Drug abuse Paternal Grandfather    Bipolar disorder Cousin    Schizophrenia Cousin     Social History Social History[1]   Allergies   Other and Peanut-containing drug products   Review of Systems Review of Systems  Constitutional:  Negative for chills and fever.  HENT:  Positive for ear pain. Negative for congestion, ear discharge, hearing loss, rhinorrhea and sore throat.      Physical Exam Triage Vital Signs ED Triage Vitals  Encounter Vitals Group     BP 05/24/24 1236 115/83     Girls Systolic BP Percentile --  Girls Diastolic BP Percentile --      Boys Systolic BP Percentile --      Boys Diastolic BP Percentile --      Pulse Rate 05/24/24 1236 70     Resp 05/24/24 1236 18     Temp 05/24/24 1236 98.5 F (36.9 C)     Temp Source 05/24/24 1236 Oral     SpO2 05/24/24 1236 98 %     Weight 05/24/24 1234 205 lb (93 kg)     Height 05/24/24 1234 5' 2 (1.575 m)     Head Circumference --      Peak Flow --      Pain Score 05/24/24 1233 4     Pain Loc --      Pain Education --      Exclude from Growth Chart --    No data found.  Updated Vital Signs BP 115/83 (BP Location: Right Arm)   Pulse 70   Temp 98.5 F (36.9 C) (Oral)   Resp 18   Ht 5' 2 (1.575 m)   Wt 205 lb (93 kg)   LMP 05/24/2024 (Exact Date)   SpO2 98%   BMI 37.49 kg/m   Visual Acuity Right Eye Distance:   Left Eye Distance:   Bilateral Distance:    Right Eye Near:   Left Eye Near:    Bilateral Near:     Physical Exam Vitals reviewed.  Constitutional:      General: She is awake.     Appearance: Normal appearance. She is well-developed and well-groomed.  HENT:     Head: Normocephalic and atraumatic.     Right Ear: Hearing, tympanic membrane and ear canal normal.     Left Ear: Hearing, tympanic membrane and ear canal normal.     Ears:     Comments: Pt has mild erythema in the left ear  canal but no obvious swelling, drainage or TM abnormalities.  No periauricular swelling, or lymphadenopathy.     Mouth/Throat:     Lips: Pink.     Mouth: Mucous membranes are moist.     Pharynx: Oropharynx is clear. Uvula midline. No pharyngeal swelling, oropharyngeal exudate, posterior oropharyngeal erythema, uvula swelling or postnasal drip.  Eyes:     General: Lids are normal. Gaze aligned appropriately.     Extraocular Movements: Extraocular movements intact.     Conjunctiva/sclera: Conjunctivae normal.  Pulmonary:     Effort: Pulmonary effort is normal.  Lymphadenopathy:     Head:     Right side of head: No submental, submandibular, preauricular or posterior auricular adenopathy.     Left side of head: No submental, submandibular, preauricular or posterior auricular adenopathy.     Cervical:     Right cervical: No superficial cervical adenopathy.    Left cervical: No superficial cervical adenopathy.     Upper Body:     Right upper body: No supraclavicular adenopathy.     Left upper body: No supraclavicular adenopathy.  Neurological:     Mental Status: She is alert and oriented to person, place, and time.  Psychiatric:        Attention and Perception: Attention and perception normal.        Mood and Affect: Mood and affect normal.        Speech: Speech normal.        Behavior: Behavior normal. Behavior is cooperative.      UC Treatments / Results  Labs (all labs ordered are listed, but only abnormal  results are displayed) Labs Reviewed - No data to display  EKG   Radiology No results found.  Procedures Procedures (including critical care time)  Medications Ordered in UC Medications - No data to display  Initial Impression / Assessment and Plan / UC Course  I have reviewed the triage vital signs and the nursing notes.  Pertinent labs & imaging results that were available during my care of the patient were reviewed by me and considered in my medical decision  making (see chart for details).      Final Clinical Impressions(s) / UC Diagnoses   Final diagnoses:  Otalgia of left ear   Patient presents today with concerns of left ear pain has been ongoing since yesterday.  She states that the pain is 3/10 in severity and is rather constant.  Physical exam is largely reassuring without evidence of otitis media, otitis externa patient also admits that she is having some scratchy throat and mild nasal congestion.  Unsure if she is developing an acute viral URI or if her ear symptoms are secondary to eustachian tube dysfunction.  For now recommend OTC medications such as Flonase, antihistamine, nasal flushes to assist with symptoms.  If symptoms progress or seem to be worsening recommend follow-up here with PCP for further evaluation.  Follow-up as needed    Discharge Instructions      You were seen today for concerns of left sided ear pain, scratchy throat, nasal congestion and runny nose.  It does not appear that you have an ear infection at this time but there is a little bit of redness in your ear canal.  This might be due to irritation or slight inflammation but there is no obvious signs of swelling or drainage.  I suspect that your ear pain might be due to eustachian tube dysfunction caused by your nasal congestion and sore throat.  I typically recommend using Flonase nasal spray, and antihistamine such as Claritin or Zyrtec , nasal flushes to assist with symptoms.  You could also alternate Tylenol  and ibuprofen  as needed for pain.  If you feel your symptoms or not improving or seem to be worsening over the next 5 to 7 days please return here or follow-up with your primary care provider for further evaluation.     ED Prescriptions   None    PDMP not reviewed this encounter.     [1]  Social History Tobacco Use   Smoking status: Former    Current packs/day: 0.00    Types: Cigarettes, Cigars    Quit date: 05/30/2020    Years since quitting:  3.9   Smokeless tobacco: Never   Tobacco comments:    Smoker on high stress situations  Vaping Use   Vaping status: Never Used  Substance Use Topics   Alcohol use: Yes    Alcohol/week: 2.0 standard drinks of alcohol    Types: 2 Glasses of wine per week    Comment: Social drinker   Drug use: Not Currently    Types: Marijuana     Benicio Manna, Rocky BRAVO, PA-C 05/24/24 1358  "

## 2024-05-24 NOTE — Discharge Instructions (Addendum)
 You were seen today for concerns of left sided ear pain, scratchy throat, nasal congestion and runny nose.  It does not appear that you have an ear infection at this time but there is a little bit of redness in your ear canal.  This might be due to irritation or slight inflammation but there is no obvious signs of swelling or drainage.  I suspect that your ear pain might be due to eustachian tube dysfunction caused by your nasal congestion and sore throat.  I typically recommend using Flonase nasal spray, and antihistamine such as Claritin or Zyrtec , nasal flushes to assist with symptoms.  You could also alternate Tylenol  and ibuprofen  as needed for pain.  If you feel your symptoms or not improving or seem to be worsening over the next 5 to 7 days please return here or follow-up with your primary care provider for further evaluation.

## 2024-05-28 ENCOUNTER — Other Ambulatory Visit: Payer: Self-pay | Admitting: Nurse Practitioner

## 2024-05-28 NOTE — Telephone Encounter (Signed)
 Requesting: DICYCLOMINE  10 MG CAPSULE  Last Visit: 05/13/2024 Next Visit: 08/07/2024 Last Refill: 05/13/2024  Please Advise   Pharmacy comment: REQUEST FOR 90 DAYS PRESCRIPTION.

## 2024-06-16 ENCOUNTER — Other Ambulatory Visit (HOSPITAL_COMMUNITY): Payer: Self-pay | Admitting: Nurse Practitioner

## 2024-06-16 DIAGNOSIS — F331 Major depressive disorder, recurrent, moderate: Secondary | ICD-10-CM

## 2024-06-19 ENCOUNTER — Ambulatory Visit: Admitting: Internal Medicine

## 2024-08-02 ENCOUNTER — Ambulatory Visit: Admitting: Internal Medicine

## 2024-08-07 ENCOUNTER — Ambulatory Visit: Admitting: Nurse Practitioner

## 2024-08-07 ENCOUNTER — Ambulatory Visit
